# Patient Record
Sex: Male | Born: 1950
Health system: Southern US, Community
[De-identification: ages and names within clinical notes are randomized; demographics above are authoritative.]

## PROBLEM LIST (undated history)

## (undated) DIAGNOSIS — E119 Type 2 diabetes mellitus without complications: Secondary | ICD-10-CM

## (undated) DIAGNOSIS — E079 Disorder of thyroid, unspecified: Secondary | ICD-10-CM

## (undated) DIAGNOSIS — M1990 Unspecified osteoarthritis, unspecified site: Secondary | ICD-10-CM

## (undated) DIAGNOSIS — E785 Hyperlipidemia, unspecified: Secondary | ICD-10-CM

## (undated) HISTORY — DX: Disorder of thyroid, unspecified: E07.9

## (undated) HISTORY — DX: Hyperlipidemia, unspecified: E78.5

## (undated) HISTORY — DX: Unspecified osteoarthritis, unspecified site: M19.90

## (undated) HISTORY — DX: Type 2 diabetes mellitus without complications: E11.9

## (undated) HISTORY — PX: APPENDECTOMY: SHX54

## (undated) HISTORY — PX: OTHER SURGICAL HISTORY: SHX169

---

## 2004-05-14 ENCOUNTER — Emergency Department: Payer: Self-pay | Admitting: Emergency Medicine

## 2008-10-22 ENCOUNTER — Inpatient Hospital Stay: Payer: Self-pay | Admitting: Specialist

## 2012-05-04 ENCOUNTER — Emergency Department: Payer: Self-pay | Admitting: Emergency Medicine

## 2012-05-04 LAB — CBC
MCH: 32.1 pg (ref 26.0–34.0)
MCHC: 33.7 g/dL (ref 32.0–36.0)
MCV: 95 fL (ref 80–100)
Platelet: 155 10*3/uL (ref 150–440)
RDW: 13.9 % (ref 11.5–14.5)

## 2012-05-04 LAB — COMPREHENSIVE METABOLIC PANEL
BUN: 9 mg/dL (ref 7–18)
EGFR (Non-African Amer.): >60
Osmolality: 275 (ref 275–301)
Potassium: 4.4 mmol/L (ref 3.5–5.1)
SGOT(AST): 19 U/L (ref 15–37)
SGPT (ALT): 28 U/L (ref 12–78)
Sodium: 138 mmol/L (ref 136–145)

## 2017-01-15 DIAGNOSIS — Z0001 Encounter for general adult medical examination with abnormal findings: Secondary | ICD-10-CM | POA: Diagnosis not present

## 2017-01-15 DIAGNOSIS — H6123 Impacted cerumen, bilateral: Secondary | ICD-10-CM | POA: Diagnosis not present

## 2017-01-15 DIAGNOSIS — R5383 Other fatigue: Secondary | ICD-10-CM | POA: Diagnosis not present

## 2017-01-15 DIAGNOSIS — Z13220 Encounter for screening for lipoid disorders: Secondary | ICD-10-CM | POA: Diagnosis not present

## 2017-01-15 DIAGNOSIS — R634 Abnormal weight loss: Secondary | ICD-10-CM | POA: Diagnosis not present

## 2017-01-15 DIAGNOSIS — Z2821 Immunization not carried out because of patient refusal: Secondary | ICD-10-CM | POA: Diagnosis not present

## 2017-01-15 DIAGNOSIS — Z125 Encounter for screening for malignant neoplasm of prostate: Secondary | ICD-10-CM | POA: Diagnosis not present

## 2017-01-15 DIAGNOSIS — Z6821 Body mass index (BMI) 21.0-21.9, adult: Secondary | ICD-10-CM | POA: Diagnosis not present

## 2017-01-15 DIAGNOSIS — Z79899 Other long term (current) drug therapy: Secondary | ICD-10-CM | POA: Diagnosis not present

## 2017-01-15 DIAGNOSIS — Z131 Encounter for screening for diabetes mellitus: Secondary | ICD-10-CM | POA: Diagnosis not present

## 2017-04-24 DIAGNOSIS — R945 Abnormal results of liver function studies: Secondary | ICD-10-CM | POA: Diagnosis not present

## 2017-04-24 DIAGNOSIS — E0590 Thyrotoxicosis, unspecified without thyrotoxic crisis or storm: Secondary | ICD-10-CM | POA: Diagnosis not present

## 2017-04-25 DIAGNOSIS — R7989 Other specified abnormal findings of blood chemistry: Secondary | ICD-10-CM | POA: Insufficient documentation

## 2017-04-25 DIAGNOSIS — R945 Abnormal results of liver function studies: Secondary | ICD-10-CM | POA: Insufficient documentation

## 2017-06-28 DIAGNOSIS — E0590 Thyrotoxicosis, unspecified without thyrotoxic crisis or storm: Secondary | ICD-10-CM | POA: Diagnosis not present

## 2017-06-28 DIAGNOSIS — R945 Abnormal results of liver function studies: Secondary | ICD-10-CM | POA: Diagnosis not present

## 2017-06-28 DIAGNOSIS — E049 Nontoxic goiter, unspecified: Secondary | ICD-10-CM | POA: Diagnosis not present

## 2017-07-13 DIAGNOSIS — E049 Nontoxic goiter, unspecified: Secondary | ICD-10-CM | POA: Diagnosis not present

## 2017-10-23 DIAGNOSIS — E0590 Thyrotoxicosis, unspecified without thyrotoxic crisis or storm: Secondary | ICD-10-CM | POA: Diagnosis not present

## 2018-06-13 ENCOUNTER — Inpatient Hospital Stay
Admission: EM | Admit: 2018-06-13 | Discharge: 2018-06-19 | DRG: 637 | Disposition: A | Discharge status: Skilled Nursing Facility | Payer: Medicare Other | Attending: Internal Medicine | Admitting: Internal Medicine

## 2018-06-13 ENCOUNTER — Inpatient Hospital Stay: Payer: Medicare Other

## 2018-06-13 ENCOUNTER — Emergency Department: Payer: Medicare Other

## 2018-06-13 ENCOUNTER — Encounter: Payer: Self-pay | Admitting: Internal Medicine

## 2018-06-13 ENCOUNTER — Other Ambulatory Visit: Payer: Self-pay

## 2018-06-13 DIAGNOSIS — R4020 Unspecified coma: Secondary | ICD-10-CM | POA: Diagnosis not present

## 2018-06-13 DIAGNOSIS — I509 Heart failure, unspecified: Secondary | ICD-10-CM | POA: Diagnosis present

## 2018-06-13 DIAGNOSIS — T383X6A Underdosing of insulin and oral hypoglycemic [antidiabetic] drugs, initial encounter: Secondary | ICD-10-CM | POA: Diagnosis present

## 2018-06-13 DIAGNOSIS — E871 Hypo-osmolality and hyponatremia: Secondary | ICD-10-CM | POA: Diagnosis present

## 2018-06-13 DIAGNOSIS — J99 Respiratory disorders in diseases classified elsewhere: Secondary | ICD-10-CM | POA: Diagnosis not present

## 2018-06-13 DIAGNOSIS — E11649 Type 2 diabetes mellitus with hypoglycemia without coma: Secondary | ICD-10-CM | POA: Diagnosis present

## 2018-06-13 DIAGNOSIS — M6281 Muscle weakness (generalized): Secondary | ICD-10-CM | POA: Diagnosis not present

## 2018-06-13 DIAGNOSIS — Z79899 Other long term (current) drug therapy: Secondary | ICD-10-CM

## 2018-06-13 DIAGNOSIS — R571 Hypovolemic shock: Secondary | ICD-10-CM | POA: Diagnosis present

## 2018-06-13 DIAGNOSIS — G9341 Metabolic encephalopathy: Secondary | ICD-10-CM | POA: Diagnosis present

## 2018-06-13 DIAGNOSIS — N179 Acute kidney failure, unspecified: Secondary | ICD-10-CM | POA: Diagnosis not present

## 2018-06-13 DIAGNOSIS — D696 Thrombocytopenia, unspecified: Secondary | ICD-10-CM | POA: Diagnosis present

## 2018-06-13 DIAGNOSIS — R40243 Glasgow coma scale score 3-8, unspecified time: Secondary | ICD-10-CM

## 2018-06-13 DIAGNOSIS — E1311 Other specified diabetes mellitus with ketoacidosis with coma: Secondary | ICD-10-CM | POA: Diagnosis not present

## 2018-06-13 DIAGNOSIS — Z6820 Body mass index (BMI) 20.0-20.9, adult: Secondary | ICD-10-CM | POA: Diagnosis not present

## 2018-06-13 DIAGNOSIS — K219 Gastro-esophageal reflux disease without esophagitis: Secondary | ICD-10-CM | POA: Diagnosis present

## 2018-06-13 DIAGNOSIS — E860 Dehydration: Secondary | ICD-10-CM

## 2018-06-13 DIAGNOSIS — G9340 Encephalopathy, unspecified: Secondary | ICD-10-CM | POA: Diagnosis not present

## 2018-06-13 DIAGNOSIS — J90 Pleural effusion, not elsewhere classified: Secondary | ICD-10-CM | POA: Diagnosis not present

## 2018-06-13 DIAGNOSIS — E039 Hypothyroidism, unspecified: Secondary | ICD-10-CM | POA: Diagnosis not present

## 2018-06-13 DIAGNOSIS — I248 Other forms of acute ischemic heart disease: Secondary | ICD-10-CM | POA: Diagnosis present

## 2018-06-13 DIAGNOSIS — I5022 Chronic systolic (congestive) heart failure: Secondary | ICD-10-CM | POA: Diagnosis not present

## 2018-06-13 DIAGNOSIS — K859 Acute pancreatitis without necrosis or infection, unspecified: Secondary | ICD-10-CM | POA: Diagnosis present

## 2018-06-13 DIAGNOSIS — J9600 Acute respiratory failure, unspecified whether with hypoxia or hypercapnia: Secondary | ICD-10-CM | POA: Diagnosis present

## 2018-06-13 DIAGNOSIS — F17200 Nicotine dependence, unspecified, uncomplicated: Secondary | ICD-10-CM | POA: Diagnosis present

## 2018-06-13 DIAGNOSIS — N170 Acute kidney failure with tubular necrosis: Secondary | ICD-10-CM | POA: Diagnosis present

## 2018-06-13 DIAGNOSIS — M2550 Pain in unspecified joint: Secondary | ICD-10-CM | POA: Diagnosis not present

## 2018-06-13 DIAGNOSIS — K760 Fatty (change of) liver, not elsewhere classified: Secondary | ICD-10-CM | POA: Diagnosis not present

## 2018-06-13 DIAGNOSIS — L89152 Pressure ulcer of sacral region, stage 2: Secondary | ICD-10-CM | POA: Diagnosis present

## 2018-06-13 DIAGNOSIS — Y92009 Unspecified place in unspecified non-institutional (private) residence as the place of occurrence of the external cause: Secondary | ICD-10-CM

## 2018-06-13 DIAGNOSIS — J9809 Other diseases of bronchus, not elsewhere classified: Secondary | ICD-10-CM | POA: Diagnosis not present

## 2018-06-13 DIAGNOSIS — Z9114 Patient's other noncompliance with medication regimen: Secondary | ICD-10-CM | POA: Diagnosis not present

## 2018-06-13 DIAGNOSIS — E1010 Type 1 diabetes mellitus with ketoacidosis without coma: Secondary | ICD-10-CM | POA: Diagnosis not present

## 2018-06-13 DIAGNOSIS — Z452 Encounter for adjustment and management of vascular access device: Secondary | ICD-10-CM

## 2018-06-13 DIAGNOSIS — E1111 Type 2 diabetes mellitus with ketoacidosis with coma: Secondary | ICD-10-CM | POA: Diagnosis present

## 2018-06-13 DIAGNOSIS — I214 Non-ST elevation (NSTEMI) myocardial infarction: Secondary | ICD-10-CM | POA: Diagnosis not present

## 2018-06-13 DIAGNOSIS — J690 Pneumonitis due to inhalation of food and vomit: Secondary | ICD-10-CM | POA: Diagnosis present

## 2018-06-13 DIAGNOSIS — M6282 Rhabdomyolysis: Secondary | ICD-10-CM | POA: Diagnosis present

## 2018-06-13 DIAGNOSIS — R739 Hyperglycemia, unspecified: Secondary | ICD-10-CM

## 2018-06-13 DIAGNOSIS — R7989 Other specified abnormal findings of blood chemistry: Secondary | ICD-10-CM | POA: Diagnosis not present

## 2018-06-13 DIAGNOSIS — E876 Hypokalemia: Secondary | ICD-10-CM | POA: Diagnosis not present

## 2018-06-13 DIAGNOSIS — Z4659 Encounter for fitting and adjustment of other gastrointestinal appliance and device: Secondary | ICD-10-CM

## 2018-06-13 DIAGNOSIS — R748 Abnormal levels of other serum enzymes: Secondary | ICD-10-CM

## 2018-06-13 DIAGNOSIS — R404 Transient alteration of awareness: Secondary | ICD-10-CM | POA: Diagnosis not present

## 2018-06-13 DIAGNOSIS — E43 Unspecified severe protein-calorie malnutrition: Secondary | ICD-10-CM | POA: Diagnosis present

## 2018-06-13 DIAGNOSIS — R0603 Acute respiratory distress: Secondary | ICD-10-CM

## 2018-06-13 DIAGNOSIS — J9601 Acute respiratory failure with hypoxia: Secondary | ICD-10-CM | POA: Diagnosis present

## 2018-06-13 DIAGNOSIS — R000 Tachycardia, unspecified: Secondary | ICD-10-CM | POA: Diagnosis not present

## 2018-06-13 DIAGNOSIS — J9602 Acute respiratory failure with hypercapnia: Secondary | ICD-10-CM | POA: Diagnosis present

## 2018-06-13 DIAGNOSIS — E870 Hyperosmolality and hypernatremia: Secondary | ICD-10-CM | POA: Diagnosis present

## 2018-06-13 DIAGNOSIS — R0902 Hypoxemia: Secondary | ICD-10-CM | POA: Diagnosis not present

## 2018-06-13 DIAGNOSIS — Z7401 Bed confinement status: Secondary | ICD-10-CM | POA: Diagnosis not present

## 2018-06-13 DIAGNOSIS — R0689 Other abnormalities of breathing: Secondary | ICD-10-CM | POA: Diagnosis not present

## 2018-06-13 DIAGNOSIS — L8990 Pressure ulcer of unspecified site, unspecified stage: Secondary | ICD-10-CM

## 2018-06-13 DIAGNOSIS — E0590 Thyrotoxicosis, unspecified without thyrotoxic crisis or storm: Secondary | ICD-10-CM | POA: Diagnosis present

## 2018-06-13 DIAGNOSIS — E1165 Type 2 diabetes mellitus with hyperglycemia: Secondary | ICD-10-CM | POA: Diagnosis not present

## 2018-06-13 DIAGNOSIS — I10 Essential (primary) hypertension: Secondary | ICD-10-CM | POA: Diagnosis not present

## 2018-06-13 DIAGNOSIS — Z888 Allergy status to other drugs, medicaments and biological substances status: Secondary | ICD-10-CM

## 2018-06-13 DIAGNOSIS — E872 Acidosis: Secondary | ICD-10-CM | POA: Diagnosis not present

## 2018-06-13 DIAGNOSIS — Z4682 Encounter for fitting and adjustment of non-vascular catheter: Secondary | ICD-10-CM | POA: Diagnosis not present

## 2018-06-13 DIAGNOSIS — R509 Fever, unspecified: Secondary | ICD-10-CM

## 2018-06-13 LAB — BLOOD GAS, ARTERIAL
Acid-base deficit: 17.8 mmol/L — ABNORMAL HIGH (ref 0.0–2.0)
Bicarbonate: 9.9 mmol/L — ABNORMAL LOW (ref 20.0–28.0)
FIO2: 40
MECHVT: 500 mL
Mechanical Rate: 15
O2 SAT: 99.3 %
PCO2 ART: 29 mmHg — AB (ref 32.0–48.0)
PEEP: 5 cmH2O
Patient temperature: 37
pH, Arterial: 7.14 — CL (ref 7.350–7.450)
pO2, Arterial: 183 mmHg — ABNORMAL HIGH (ref 83.0–108.0)

## 2018-06-13 LAB — COMPREHENSIVE METABOLIC PANEL
ALT: 30 U/L (ref 0–44)
AST: 20 U/L (ref 15–41)
Albumin: 4.4 g/dL (ref 3.5–5.0)
Alkaline Phosphatase: 93 U/L (ref 38–126)
Anion gap: 40 — ABNORMAL HIGH (ref 5–15)
BUN: 38 mg/dL — ABNORMAL HIGH (ref 8–23)
CHLORIDE: 102 mmol/L (ref 98–111)
CO2: 7 mmol/L — ABNORMAL LOW (ref 22–32)
CREATININE: 2.81 mg/dL — AB (ref 0.61–1.24)
Calcium: 10.1 mg/dL (ref 8.9–10.3)
GFR calc Af Amer: 26 mL/min — ABNORMAL LOW (ref >60)
GFR calc non Af Amer: 22 mL/min — ABNORMAL LOW (ref >60)
Glucose, Bld: 1150 mg/dL (ref 70–99)
Potassium: 3.9 mmol/L (ref 3.5–5.1)
Sodium: 149 mmol/L — ABNORMAL HIGH (ref 135–145)
Total Bilirubin: 2.1 mg/dL — ABNORMAL HIGH (ref 0.3–1.2)
Total Protein: 7.9 g/dL (ref 6.5–8.1)

## 2018-06-13 LAB — URINALYSIS, COMPLETE (UACMP) WITH MICROSCOPIC
Bacteria, UA: NONE SEEN
Bilirubin Urine: NEGATIVE
Glucose, UA: >=500 mg/dL — AB
Ketones, ur: 20 mg/dL — AB
LEUKOCYTE UA: NEGATIVE
Nitrite: NEGATIVE
Protein, ur: NEGATIVE mg/dL
Specific Gravity, Urine: 1.028 (ref 1.005–1.030)
Squamous Epithelial / HPF: NONE SEEN (ref 0–5)
pH: 5 (ref 5.0–8.0)

## 2018-06-13 LAB — URINE DRUG SCREEN, QUALITATIVE (ARMC ONLY)
Amphetamines, Ur Screen: NOT DETECTED
Barbiturates, Ur Screen: NOT DETECTED
Benzodiazepine, Ur Scrn: NOT DETECTED
COCAINE METABOLITE, UR ~~LOC~~: NOT DETECTED
Cannabinoid 50 Ng, Ur ~~LOC~~: NOT DETECTED
MDMA (Ecstasy)Ur Screen: NOT DETECTED
Methadone Scn, Ur: NOT DETECTED
Opiate, Ur Screen: NOT DETECTED
Phencyclidine (PCP) Ur S: NOT DETECTED
Tricyclic, Ur Screen: NOT DETECTED

## 2018-06-13 LAB — LACTIC ACID, PLASMA
Lactic Acid, Venous: 5.7 mmol/L (ref 0.5–1.9)
Lactic Acid, Venous: 2.3 mmol/L (ref 0.5–1.9)
Lactic Acid, Venous: 2.2 mmol/L (ref 0.5–1.9)

## 2018-06-13 LAB — CBC WITH DIFFERENTIAL/PLATELET
Abs Immature Granulocytes: 0.97 10*3/uL — ABNORMAL HIGH (ref 0.00–0.07)
Basophils Absolute: 0.1 10*3/uL (ref 0.0–0.1)
Basophils Relative: 0 %
Eosinophils Absolute: 0 10*3/uL (ref 0.0–0.5)
Eosinophils Relative: 0 %
HCT: 52.5 % — ABNORMAL HIGH (ref 39.0–52.0)
Hemoglobin: 15.9 g/dL (ref 13.0–17.0)
Immature Granulocytes: 4 %
Lymphocytes Relative: 4 %
Lymphs Abs: 1.1 10*3/uL (ref 0.7–4.0)
MCH: 31.5 pg (ref 26.0–34.0)
MCHC: 30.3 g/dL (ref 30.0–36.0)
MCV: 104.2 fL — ABNORMAL HIGH (ref 80.0–100.0)
MONO ABS: 2.4 10*3/uL — AB (ref 0.1–1.0)
Monocytes Relative: 9 %
Neutro Abs: 22.9 10*3/uL — ABNORMAL HIGH (ref 1.7–7.7)
Neutrophils Relative %: 83 %
Platelets: 272 10*3/uL (ref 150–400)
RBC: 5.04 MIL/uL (ref 4.22–5.81)
RDW: 13.6 % (ref 11.5–15.5)
Smear Review: NORMAL
WBC: 27.5 10*3/uL — ABNORMAL HIGH (ref 4.0–10.5)
nRBC: 0 % (ref 0.0–0.2)

## 2018-06-13 LAB — BASIC METABOLIC PANEL
Anion gap: 19 — ABNORMAL HIGH (ref 5–15)
BUN: 41 mg/dL — ABNORMAL HIGH (ref 8–23)
CO2: 18 mmol/L — ABNORMAL LOW (ref 22–32)
Calcium: 8.2 mg/dL — ABNORMAL LOW (ref 8.9–10.3)
Chloride: 121 mmol/L — ABNORMAL HIGH (ref 98–111)
Creatinine, Ser: 2.49 mg/dL — ABNORMAL HIGH (ref 0.61–1.24)
GFR calc Af Amer: 30 mL/min — ABNORMAL LOW (ref >60)
GFR calc non Af Amer: 26 mL/min — ABNORMAL LOW (ref >60)
Glucose, Bld: 746 mg/dL (ref 70–99)
Potassium: 3.5 mmol/L (ref 3.5–5.1)
Sodium: 158 mmol/L — ABNORMAL HIGH (ref 135–145)

## 2018-06-13 LAB — BLOOD GAS, VENOUS
Acid-base deficit: 23.9 mmol/L — ABNORMAL HIGH (ref 0.0–2.0)
Bicarbonate: 7.5 mmol/L — ABNORMAL LOW (ref 20.0–28.0)
O2 SAT: 71.7 %
PCO2 VEN: 34 mmHg — AB (ref 44.0–60.0)
Patient temperature: 37
pH, Ven: 6.95 — CL (ref 7.250–7.430)
pO2, Ven: 62 mmHg — ABNORMAL HIGH (ref 32.0–45.0)

## 2018-06-13 LAB — GLUCOSE, CAPILLARY
Glucose-Capillary: >600 mg/dL (ref 70–99)
Glucose-Capillary: >600 mg/dL (ref 70–99)
Glucose-Capillary: >600 mg/dL (ref 70–99)
Glucose-Capillary: >600 mg/dL (ref 70–99)
Glucose-Capillary: 519 mg/dL (ref 70–99)

## 2018-06-13 LAB — INFLUENZA PANEL BY PCR (TYPE A & B)
Influenza A By PCR: NEGATIVE
Influenza B By PCR: NEGATIVE

## 2018-06-13 LAB — AMYLASE: Amylase: 1300 U/L — ABNORMAL HIGH (ref 28–100)

## 2018-06-13 LAB — PROCALCITONIN: Procalcitonin: 3.37 ng/mL

## 2018-06-13 LAB — SALICYLATE LEVEL

## 2018-06-13 LAB — ETHANOL: Alcohol, Ethyl (B): <10 mg/dL (ref <10)

## 2018-06-13 LAB — PROTIME-INR
INR: 1.2 (ref 0.8–1.2)
Prothrombin Time: 15.2 seconds (ref 11.4–15.2)

## 2018-06-13 LAB — MRSA PCR SCREENING: MRSA by PCR: NEGATIVE

## 2018-06-13 LAB — TRIGLYCERIDES: Triglycerides: 728 mg/dL — ABNORMAL HIGH (ref <150)

## 2018-06-13 LAB — ACETAMINOPHEN LEVEL: Acetaminophen (Tylenol), Serum: <10 ug/mL — ABNORMAL LOW (ref 10–30)

## 2018-06-13 LAB — LIPASE, BLOOD: Lipase: 326 U/L — ABNORMAL HIGH (ref 11–51)

## 2018-06-13 LAB — AMMONIA: AMMONIA: 22 umol/L (ref 9–35)

## 2018-06-13 LAB — TROPONIN I
Troponin I: 0.06 ng/mL (ref <0.03)
Troponin I: 0.22 ng/mL (ref <0.03)

## 2018-06-13 LAB — CK: Total CK: 555 U/L — ABNORMAL HIGH (ref 49–397)

## 2018-06-13 MED ORDER — FENTANYL CITRATE (PF) 100 MCG/2ML IJ SOLN
100.0000 ug | Freq: Once | INTRAMUSCULAR | Status: AC
  Administered 2018-06-13: 100 ug via INTRAVENOUS
  Filled 2018-06-13: qty 2

## 2018-06-13 MED ORDER — INSULIN REGULAR(HUMAN) IN NACL 100-0.9 UT/100ML-% IV SOLN
INTRAVENOUS | Status: DC
  Administered 2018-06-14: 16.3 [IU]/h via INTRAVENOUS
  Filled 2018-06-13: qty 100

## 2018-06-13 MED ORDER — STERILE WATER FOR INJECTION IV SOLN
INTRAVENOUS | Status: DC
  Administered 2018-06-13: 22:00:00 via INTRAVENOUS
  Filled 2018-06-13 (×2): qty 850

## 2018-06-13 MED ORDER — LACTATED RINGERS IV SOLN
INTRAVENOUS | Status: DC
  Administered 2018-06-14: via INTRAVENOUS

## 2018-06-13 MED ORDER — IPRATROPIUM-ALBUTEROL 0.5-2.5 (3) MG/3ML IN SOLN: 3.0000 mL | RESPIRATORY_TRACT | Status: DC | PRN

## 2018-06-13 MED ORDER — FAMOTIDINE IN NACL 20-0.9 MG/50ML-% IV SOLN
20.0000 mg | Freq: Two times a day (BID) | INTRAVENOUS | Status: DC
  Administered 2018-06-14 (×3): 20 mg via INTRAVENOUS
  Filled 2018-06-13 (×3): qty 50

## 2018-06-13 MED ORDER — METRONIDAZOLE IN NACL 5-0.79 MG/ML-% IV SOLN
500.0000 mg | Freq: Three times a day (TID) | INTRAVENOUS | Status: DC
  Administered 2018-06-13: 500 mg via INTRAVENOUS
  Filled 2018-06-13 (×3): qty 100

## 2018-06-13 MED ORDER — PIPERACILLIN-TAZOBACTAM 3.375 G IVPB
3.3750 g | Freq: Three times a day (TID) | INTRAVENOUS | Status: DC
  Administered 2018-06-14 – 2018-06-15 (×5): 3.375 g via INTRAVENOUS
  Filled 2018-06-13 (×5): qty 50

## 2018-06-13 MED ORDER — KETAMINE HCL 10 MG/ML IJ SOLN
2.0000 mg/kg | Freq: Once | INTRAMUSCULAR | Status: AC
  Administered 2018-06-13: 121 mg via INTRAVENOUS

## 2018-06-13 MED ORDER — LORAZEPAM 2 MG/ML IJ SOLN
2.0000 mg | Freq: Once | INTRAMUSCULAR | Status: AC
  Administered 2018-06-13: 2 mg via INTRAVENOUS
  Filled 2018-06-13: qty 1

## 2018-06-13 MED ORDER — VANCOMYCIN HCL 500 MG IV SOLR
500.0000 mg | Freq: Once | INTRAVENOUS | Status: AC
  Administered 2018-06-13: 500 mg via INTRAVENOUS
  Filled 2018-06-13 (×2): qty 500

## 2018-06-13 MED ORDER — SODIUM CHLORIDE 0.9 % IV SOLN
2.0000 mg/h | INTRAVENOUS | Status: DC
  Administered 2018-06-13: 2 mg/h via INTRAVENOUS
  Filled 2018-06-13: qty 10

## 2018-06-13 MED ORDER — POTASSIUM CHLORIDE 10 MEQ/100ML IV SOLN: 10.0000 meq | INTRAVENOUS | Status: DC

## 2018-06-13 MED ORDER — SODIUM CHLORIDE 0.9 % IV SOLN: INTRAVENOUS | Status: AC

## 2018-06-13 MED ORDER — SODIUM CHLORIDE 0.9 % IV BOLUS
1000.0000 mL | Freq: Once | INTRAVENOUS | Status: AC
  Administered 2018-06-13: 1000 mL via INTRAVENOUS

## 2018-06-13 MED ORDER — SODIUM BICARBONATE 8.4 % IV SOLN
100.0000 meq | Freq: Once | INTRAVENOUS | Status: AC
  Administered 2018-06-13: 100 meq via INTRAVENOUS
  Filled 2018-06-13: qty 100

## 2018-06-13 MED ORDER — MIDAZOLAM HCL 2 MG/2ML IJ SOLN
1.0000 mg | INTRAMUSCULAR | Status: DC | PRN
  Filled 2018-06-13 (×2): qty 2

## 2018-06-13 MED ORDER — FENTANYL CITRATE (PF) 100 MCG/2ML IJ SOLN: 50.0000 ug | INTRAMUSCULAR | Status: DC | PRN

## 2018-06-13 MED ORDER — SODIUM CHLORIDE 0.9 % IV BOLUS
2000.0000 mL | Freq: Once | INTRAVENOUS | Status: AC
  Administered 2018-06-13: 2000 mL via INTRAVENOUS

## 2018-06-13 MED ORDER — FENTANYL CITRATE (PF) 100 MCG/2ML IJ SOLN
50.0000 ug | INTRAMUSCULAR | Status: DC | PRN
  Administered 2018-06-14: 50 ug via INTRAVENOUS

## 2018-06-13 MED ORDER — SODIUM CHLORIDE 0.9 % IV SOLN
2.0000 g | Freq: Once | INTRAVENOUS | Status: AC
  Administered 2018-06-13: 2 g via INTRAVENOUS
  Filled 2018-06-13: qty 2

## 2018-06-13 MED ORDER — MIDAZOLAM 50MG/50ML (1MG/ML) PREMIX INFUSION
2.0000 mg/h | INTRAVENOUS | Status: DC
  Filled 2018-06-13: qty 50

## 2018-06-13 MED ORDER — SODIUM CHLORIDE 0.9 % IV SOLN
INTRAVENOUS | Status: DC
  Administered 2018-06-13: 20:00:00 via INTRAVENOUS

## 2018-06-13 MED ORDER — PANTOPRAZOLE SODIUM 40 MG IV SOLR
40.0000 mg | INTRAVENOUS | Status: DC
  Administered 2018-06-14 (×2): 40 mg via INTRAVENOUS
  Filled 2018-06-13 (×2): qty 40

## 2018-06-13 MED ORDER — HEPARIN SODIUM (PORCINE) 5000 UNIT/ML IJ SOLN
5000.0000 [IU] | Freq: Three times a day (TID) | INTRAMUSCULAR | Status: DC
  Administered 2018-06-13: 5000 [IU] via SUBCUTANEOUS
  Filled 2018-06-13: qty 1

## 2018-06-13 MED ORDER — SODIUM CHLORIDE 0.9 % IV SOLN
2.0000 g | INTRAVENOUS | Status: DC
  Filled 2018-06-13: qty 2

## 2018-06-13 MED ORDER — FENTANYL 2500MCG IN NS 250ML (10MCG/ML) PREMIX INFUSION
0.0000 ug/h | INTRAVENOUS | Status: DC
  Administered 2018-06-13: 50 ug/h via INTRAVENOUS
  Administered 2018-06-15 (×2): 75 ug/h via INTRAVENOUS
  Filled 2018-06-13 (×2): qty 250

## 2018-06-13 MED ORDER — VANCOMYCIN HCL IN DEXTROSE 1-5 GM/200ML-% IV SOLN
1000.0000 mg | Freq: Once | INTRAVENOUS | Status: AC
  Administered 2018-06-13: 1000 mg via INTRAVENOUS
  Filled 2018-06-13: qty 200

## 2018-06-13 MED ORDER — LACTATED RINGERS IV BOLUS
1000.0000 mL | Freq: Once | INTRAVENOUS | Status: AC
  Administered 2018-06-13: 1000 mL via INTRAVENOUS

## 2018-06-13 MED ORDER — MIDAZOLAM HCL 2 MG/2ML IJ SOLN
1.0000 mg | INTRAMUSCULAR | Status: DC | PRN
  Administered 2018-06-14 – 2018-06-15 (×2): 1 mg via INTRAVENOUS
  Filled 2018-06-13: qty 2

## 2018-06-13 MED ORDER — DEXTROSE-NACL 5-0.45 % IV SOLN
INTRAVENOUS | Status: DC
  Administered 2018-06-14: 04:00:00 via INTRAVENOUS

## 2018-06-13 MED ORDER — ROCURONIUM BROMIDE 50 MG/5ML IV SOLN
60.0000 mg | Freq: Once | INTRAVENOUS | Status: AC
  Administered 2018-06-13: 60 mg via INTRAVENOUS
  Filled 2018-06-13: qty 6

## 2018-06-13 MED ORDER — VANCOMYCIN VARIABLE DOSE PER UNSTABLE RENAL FUNCTION (PHARMACIST DOSING): Status: DC

## 2018-06-13 MED ORDER — POTASSIUM CHLORIDE 10 MEQ/100ML IV SOLN
10.0000 meq | INTRAVENOUS | Status: AC
  Administered 2018-06-13 (×2): 10 meq via INTRAVENOUS
  Filled 2018-06-13 (×2): qty 100

## 2018-06-13 MED ORDER — INSULIN REGULAR(HUMAN) IN NACL 100-0.9 UT/100ML-% IV SOLN
INTRAVENOUS | Status: DC
  Administered 2018-06-13: 5.4 [IU]/h via INTRAVENOUS
  Filled 2018-06-13: qty 100

## 2018-06-13 NOTE — Consult Note (Addendum)
Pharmacy Antibiotic Note  Joseph Luna is a 68 y.o. male admitted on 06/13/2018 with respiratory distress, hyperglycemia  and Sepsis.  Pharmacy has been consulted for Cefepime and Vancomycin dosing. Patient received Vancomycin 1g IV and Cefepime 2g IV x 1 dose in ED.   Plan: Will order an additional Vancomycin 500mg  x 1 for a total LD of 1500mg . Patient has acute renal failure. Scr is 2.81. Will order vancomycin based on random levels. 1st level 2/28 @ 1800. Pharmacy will continue to follow renal function and order vancomycin doses as needed   Start Cefepime 2g IV every 24 hours.    Height: 6' (182.9 cm) Weight: 133 lb 6.1 oz (60.5 kg) IBW/kg (Calculated) : 77.6  No data recorded.  Recent Labs  Lab 06/13/18 1455 06/13/18 1844  WBC 27.5*  --   CREATININE 2.81*  --   LATICACIDVEN 5.7* 2.3*    Estimated Creatinine Clearance: 21.8 mL/min (A) (by C-G formula based on SCr of 2.81 mg/dL (H)).    No Known Allergies  Antimicrobials this admission: 2/27 vancomycin >>  2/27 Cefepime >>   Dose adjustments this admission:   Microbiology results: 2/7 UCx: pending  Thank you for allowing pharmacy to be a part of this patient's care.  Gardner Candle, PharmD, BCPS Clinical Pharmacist 06/13/2018 7:38 PM

## 2018-06-13 NOTE — Consult Note (Addendum)
PULMONARY / CRITICAL CARE MEDICINE   Name: Joseph Luna MRN: [id number] DOB: Sep 03, 1950    ADMISSION DATE:  06/13/2018 CONSULTATION DATE:  06/13/2018  REFERRING MD: Dr. Posey Pronto  CHIEF COMPLAINT: Unresponsive, DKA  BRIEF DISCUSSION: 68 year old male with past medical history of diabetes, was found unresponsive at home, required intubation in the ED for airway protection and impending respiratory failure. Patient with severe DKA and diabetic coma, severe metabolic acidosis, and AKI.  Patient meets SIRS criteria, concern for possible pancreatitis and possible aspiration pneumonia.  HISTORY OF PRESENT ILLNESS:   Joseph Luna is a 68 year old male with a past medical history of diabetes and hypothyroidism who presents to [facility] ED on 06/13/2018 after being found unresponsive in the floor at his home today by his family.  Last known well time was yesterday afternoon.  Upon arrival to the ED he was noted to be comatose and hypoxic, therefore he was intubated for airway protection and impending respiratory failure.  Initial work-up in the ED revealed glucose 1150, serum bicarb 7, anion gap 40, creatinine 2.81, sodium 149, WBCs 27.5, lipase 326 , troponin 0.06, CK 555, lactic acid 5.7.  Venous blood gas with metabolic acidosis.  Urinalysis is positive for ketones and negative for UTI.  Urine drug screen is negative, CT head is negative, chest x-ray is negative.  He is negative for the flu, serum Tylenol less than 10, and salicylates less than 7.  He received aggressive IV fluid resuscitation, empiric antibiotics, and was placed on insulin drip. He is being admitted to [facility] ICU for severe DKA and diabetic coma, acute hypoxic respiratory failure requiring intubation, severe metabolic acidosis, and AKI in the setting of severe dehydration.  He meets SIRS criteria, no infectious source has yet been identified, but there is concern for possible Pancreatitis and possible Aspiration Pneumonia.  PCCM was consulted for further  management.  PAST MEDICAL HISTORY :  He  has a past medical history of Hypothyroid.  PAST SURGICAL HISTORY: He  has a past surgical history that includes none.  No Known Allergies  No current facility-administered medications on file prior to encounter.    Current Outpatient Medications on File Prior to Encounter  Medication Sig  . methimazole (TAPAZOLE) 10 MG tablet Take 20 mg by mouth daily.    FAMILY HISTORY:  His has no family status information on file.    SOCIAL HISTORY: He  reports that he has been smoking. He has never used smokeless tobacco.  REVIEW OF SYSTEMS:   Unable to obtain due to intubation and sedation  SUBJECTIVE:  Unable to obtain due to intubation and sedation  VITAL SIGNS: BP (!) 110/54 (BP Location: Left Arm)   Pulse (!) 116   Temp 98.4 F (36.9 C) (Oral)   Resp 17   Ht 6' (1.829 m)   Wt 67.6 kg   SpO2 100%   BMI 20.21 kg/m   HEMODYNAMICS:    VENTILATOR SETTINGS: Vent Mode: PRVC FiO2 (%):  [40 %] 40 % Set Rate:  [14 bmp] 14 bmp Vt Set:  [500 mL] 500 mL PEEP:  [5 cmH20] 5 cmH20 Plateau Pressure:  [12 cmH20] 12 cmH20  INTAKE / OUTPUT: I/O last 3 completed shifts: In: 2000 [IV Piggyback:2000] Out: -   PHYSICAL EXAMINATION: General: Acutely ill-appearing male, laying in bed, intubated, sedated, no acute distress Neuro: Sedated, withdraws from pain, pupils PERRLA 3 mm sluggish bilaterally HEENT: Atraumatic, normocephalic, neck supple, no JVD Cardiovascular: Tachycardia, regular rhythm, S1-S2, no murmurs rubs or gallops, 1+ pulses  throughout Lungs: Clear to auscultation bilaterally, tachypnea, even, nonlabored, vent assisted Abdomen: Soft, nontender, nondistended, no guarding or rebound tenderness, bowel sounds hypoactive Musculoskeletal: Normal bulk and tone, no deformities, no edema Skin: Cool and dry, no obvious rashes, lesions, or ulcerations  LABS:  BMET Recent Labs  Lab 06/13/18 1455  NA 149*  K 3.9  CL 102  CO2 7*   BUN 38*  CREATININE 2.81*  GLUCOSE 1,150*    Electrolytes Recent Labs  Lab 06/13/18 1455  CALCIUM 10.1    CBC Recent Labs  Lab 06/13/18 1455  WBC 27.5*  HGB 15.9  HCT 52.5*  PLT 272    Coag's Recent Labs  Lab 06/13/18 1455  INR 1.2    Sepsis Markers Recent Labs  Lab 06/13/18 1455 06/13/18 1844  LATICACIDVEN 5.7* 2.3*    ABG No results for input(s): PHART, PCO2ART, PO2ART in the last 168 hours.  Liver Enzymes Recent Labs  Lab 06/13/18 1455  AST 20  ALT 30  ALKPHOS 93  BILITOT 2.1*  ALBUMIN 4.4    Cardiac Enzymes Recent Labs  Lab 06/13/18 1455  TROPONINI 0.06*    Glucose Recent Labs  Lab 06/13/18 1754 06/13/18 2005  GLUCAP >600* >600*    Imaging Ct Head Wo Contrast  Result Date: 06/13/2018 CLINICAL DATA:  Found unresponsive this morning. EXAM: CT HEAD WITHOUT CONTRAST TECHNIQUE: Contiguous axial images were obtained from the base of the skull through the vertex without intravenous contrast. COMPARISON:  None. FINDINGS: Brain: Mild chronic small vessel ischemic changes within the deep periventricular white matter. No mass, hemorrhage, edema or other evidence of acute parenchymal abnormality. No extra-axial hemorrhage. Vascular: Chronic calcified atherosclerotic changes of the large vessels at the skull base. No unexpected hyperdense vessel. Skull: Normal. Negative for fracture or focal lesion. Sinuses/Orbits: No acute finding. Other: None. IMPRESSION: No acute findings. No intracranial mass, hemorrhage or edema. Electronically Signed   By: Franki Cabot M.D.   On: 06/13/2018 16:08   Dg Chest Portable 1 View  Result Date: 06/13/2018 CLINICAL DATA:  Hypoxia and unresponsive EXAM: PORTABLE CHEST 1 VIEW COMPARISON:  None. FINDINGS: Endotracheal tube tip is 4.1 cm above the carina. No pneumothorax. There is no edema or consolidation. Heart size and pulmonary vascularity are normal. No adenopathy. There is aortic atherosclerosis. No bone lesions.  IMPRESSION: Endotracheal tube as described without pneumothorax. No edema or consolidation. Heart size normal. Aortic Atherosclerosis (ICD10-I70.0). Electronically Signed   By: Lowella Grip III M.D.   On: 06/13/2018 15:50     STUDIES:  CT head 2/27>> negative Abdominal ultrasound 2/27>>  CULTURES: Urine 2/27>> Tracheal aspirate 2/27>> Blood x2 2/27>>  ANTIBIOTICS: Cefepime 2/27>> Vancomycin 2/27>>  SIGNIFICANT EVENTS: 06/13/2018>> admission to [facility] ICU  LINES/TUBES: ETT 2/27>>   ASSESSMENT / PLAN:  PULMONARY A: Acute Hypoxic Respiratory Failure in setting of Diabetic Coma and severe Metabolic Acidosis, ? Aspiration in setting of unresponsiveness  P:   Full Vent support, PRVC: 8 cc/kg Wean FiO2 & PEEP as tolerated Follow intermittent chest x-ray and ABG VAP bundle SBT when parameters met and mental status permits Follow sputum culture Continue cefepime and vancomycin for now  CARDIOVASCULAR A:  Tachycardia in setting of severe dehydration Mildly elevated troponin, likely demand ischemia P:  Cardiac monitoring Maintain map greater than 65 Aggressive IV fluids Levophed if needed to maintain map goal Trend troponin  RENAL A:   AKI in setting of severe dehydration Hypernatremia Severe Anion gap metabolic acidosis in setting of DKA and lactic acidosis -ABG:  pH 7.14/CO2 29/bicarb 9.9 Rhabdomyolysis P:   Monitor I&O's / urinary output Follow BMP Ensure adequate renal perfusion Avoid nephrotoxic agents as able Replace electrolytes as indicated Aggressive IV fluids Will give 2 Amps of bicarb and place on bicarb infusion Trend lactic acid Trend CK  GASTROINTESTINAL A:   Elevated lipase, concern for possible pancreatitis P:   N.p.o. IV fluids OG tube Obtain amylase and Triglycerides Obtain abdominal ultrasound Pepcid for SUP  HEMATOLOGIC A:   No acute issues P:  Monitor for S/Sx of bleeding Trend CBC SQ Heparin for VTE Prophylaxis   Transfuse for Hgb <7   INFECTIOUS A:   Meets SIRS criteria Leukocytosis No obvious source of infection, but concern for possible pancreatitis and possible aspiration pneumonia -Urinalysis negative for UTI -Chest x-ray without infiltrate P:   Monitor fever curve Trend WBCs and procalcitonin Follow pan cultures Continue vancomycin and cefepime Serum amylase and abdominal ultrasound pending>> if pancreatitis is identified, will change cefepime to Zosyn  ENDOCRINE A:   DKA  Hypothyroidism P:   Follow DKA protocol Aggressive IV fluids IV insulin drip Follow BMP every 4 hours Consult diabetes coordinator, appreciate input Continue home Synthroid   NEUROLOGIC A:   Acute metabolic encephalopathy in setting of diabetic coma and DKA -CT head negative for any acute intracranial abnormalities -Urine drug screen negative P:   RASS goal: 0 to -1 Fentanyl drip to maintain RASS goal Avoid sedating meds as able Provide supportive care Treat metabolic issues   FAMILY  - Updates: No family present at bedside for update 06/13/2018. Pt is a Full Code.  - Inter-disciplinary family meet or Palliative Care meeting due by:  06/20/2018    Darel Hong, AGACNP-BC [facility] Pager: [phone] Cell: [phone]  06/13/2018, 8:39 PM

## 2018-06-13 NOTE — ED Notes (Signed)
ED TO INPATIENT HANDOFF REPORT  Name/Age/Gender Joseph Luna 68 y.o. male  Code Status   Home/SNF/Other home  Chief Complaint unresponsive ems  Level of Care/Admitting Diagnosis ED Disposition    ED Disposition Condition Comment   Admit  Hospital Area: [facility] [[id number]]  Level of Care: ICU [6]  Diagnosis: Acute respiratory failure (HCC) [518.81.ICD-9-CM]  Admitting Physician: [place] Bilberry [[id number]]  Attending Physician: [place] Bilberry [[id number]]  Estimated length of stay: past midnight tomorrow  Certification:: I certify this patient will need inpatient services for at least 2 midnights  PT Class (Do Not Modify): Inpatient [101]  PT Acc Code (Do Not Modify): Private [1]       Medical History Past Medical History:  Diagnosis Date  . Hypothyroid     Allergies Allergies not on file  IV Location/Drains/Wounds Patient Lines/Drains/Airways Status   Active Line/Drains/Airways    Name:   Placement date:   Placement time:   Site:   Days:   Peripheral IV 06/13/18 Right Hand   06/13/18    1432    Hand   less than 1   Peripheral IV 06/13/18 Left Antecubital   06/13/18    1558    Antecubital   less than 1   Peripheral IV 06/13/18 Right Antecubital   06/13/18    1757    Antecubital   less than 1   Airway 8 mm   06/13/18    1500     less than 1          Labs/Imaging Results for orders placed or performed during the hospital encounter of 06/13/18 (from the past 48 hour(s))  Comprehensive metabolic panel     Status: Abnormal   Collection Time: 06/13/18  2:55 PM  Result Value Ref Range   Sodium 149 (H) 135 - 145 mmol/L   Potassium 3.9 3.5 - 5.1 mmol/L   Chloride 102 98 - 111 mmol/L   CO2 7 (L) 22 - 32 mmol/L   Glucose, Bld 1,150 (HH) 70 - 99 mg/dL    Comment: CRITICAL RESULT CALLED TO, READ BACK BY AND VERIFIED WITH Fumie Fiallo @ 1602 ON 06/13/18 BY JUW    BUN 38 (H) 8 - 23 mg/dL   Creatinine, Ser 8.11 (H) 0.61 - 1.24 mg/dL   Calcium 91.4  8.9 - 78.2 mg/dL   Total Protein 7.9 6.5 - 8.1 g/dL   Albumin 4.4 3.5 - 5.0 g/dL   AST 20 15 - 41 U/L   ALT 30 0 - 44 U/L   Alkaline Phosphatase 93 38 - 126 U/L   Total Bilirubin 2.1 (H) 0.3 - 1.2 mg/dL   GFR calc non Af Amer 22 (L) >60 mL/min   GFR calc Af Amer 26 (L) >60 mL/min   Anion gap 40 (H) 5 - 15    Comment: Performed at [facility], [address] [place], [place] [address]  Acetaminophen level     Status: Abnormal   Collection Time: 06/13/18  2:55 PM  Result Value Ref Range   Acetaminophen (Tylenol), Serum <10 (L) 10 - 30 ug/mL    Comment: (NOTE) Therapeutic concentrations vary significantly. A range of 10-30 ug/mL  may be an effective concentration for many patients. However, some  are best treated at concentrations outside of this range. Acetaminophen concentrations >150 ug/mL at 4 hours after ingestion  and >50 ug/mL at 12 hours after ingestion are often associated with  toxic reactions. Performed at [facility], [address] [place], [place] [address]   Salicylate level     Status: None   Collection Time: 06/13/18  2:55 PM  Result Value Ref Range   Salicylate Lvl <7.0 2.8 - 30.0 mg/dL    Comment: Performed at [facility], [address] [place], [place] [address]  CBC with Differential     Status: Abnormal   Collection Time: 06/13/18  2:55 PM  Result Value Ref Range   WBC 27.5 (H) 4.0 - 10.5 K/uL   RBC 5.04 4.22 - 5.81 MIL/uL   Hemoglobin 15.9 13.0 - 17.0 g/dL   HCT 82.952.5 (H) 56.239.0 - 13.052.0 %   MCV 104.2 (H) 80.0 - 100.0 fL   MCH 31.5 26.0 - 34.0 pg   MCHC 30.3 30.0 - 36.0 g/dL   RDW 86.513.6 78.411.5 - 69.615.5 %   Platelets 272 150 - 400 K/uL   nRBC 0.0 0.0 - 0.2 %   Neutrophils Relative % 83 %   Neutro Abs 22.9 (H) 1.7 - 7.7 K/uL   Lymphocytes Relative 4 %   Lymphs Abs 1.1 0.7 - 4.0 K/uL   Monocytes Relative 9 %   Monocytes Absolute 2.4 (H) 0.1 - 1.0 K/uL   Eosinophils Relative 0 %   Eosinophils Absolute 0.0 0.0 - 0.5 K/uL    Basophils Relative 0 %   Basophils Absolute 0.1 0.0 - 0.1 K/uL   WBC Morphology TOXIC GRANULATION    RBC Morphology MORPHOLOGY UNREMARKABLE    Smear Review Normal platelet morphology    Immature Granulocytes 4 %   Abs Immature Granulocytes 0.97 (H) 0.00 - 0.07 K/uL    Comment: Performed at [facility], [address] [place], [place] [address]  Protime-INR     Status: None   Collection Time: 06/13/18  2:55 PM  Result Value Ref Range   Prothrombin Time 15.2 11.4 - 15.2 seconds   INR 1.2 0.8 - 1.2    Comment: (NOTE) INR goal varies based on device and disease states. Performed at [facility], [address] [place], [place] [address]   Lactic acid, plasma     Status: Abnormal   Collection Time: 06/13/18  2:55 PM  Result Value Ref Range   Lactic Acid, Venous 5.7 (HH) 0.5 - 1.9 mmol/L    Comment: CRITICAL RESULT CALLED TO, READ BACK BY AND VERIFIED WITH Addysen Louth @ 1602 ON 06/13/18 BY JUW Performed at [facility], [address] [place], [place] [address]   Ammonia     Status: None   Collection Time: 06/13/18  2:55 PM  Result Value Ref Range   Ammonia 22 9 - 35 umol/L    Comment: Performed at [facility], [address] [place], [place] [address]  Ethanol     Status: None   Collection Time: 06/13/18  2:55 PM  Result Value Ref Range   Alcohol, Ethyl (B) <10 <10 mg/dL    Comment: (NOTE) Lowest detectable limit for serum alcohol is 10 mg/dL. For medical purposes only. Performed at [facility], [address] [place], [place] [address]   Lipase, blood     Status: Abnormal   Collection Time: 06/13/18  2:55 PM  Result Value Ref Range   Lipase 326 (H) 11 - 51 U/L    Comment: RESULTS CONFIRMED BY MANUAL DILUTION Performed at [facility], [address] [place], [place] [address]   Troponin I - Once     Status: Abnormal   Collection Time: 06/13/18  2:55 PM  Result Value Ref Range   Troponin I 0.06 (HH)  <0.03 ng/mL    Comment: CRITICAL RESULT CALLED TO, READ BACK BY AND VERIFIED WITH Sherina Stammer @ 1602 ON 06/13/18 BY JUW Performed at [facility], [address] [place], [place] [address]   Urinalysis, Complete w Microscopic     Status: Abnormal   Collection Time: 06/13/18  3:06 PM  Result Value Ref Range   Color, Urine YELLOW (A) YELLOW   APPearance CLEAR (A) CLEAR   Specific Gravity, Urine 1.028 1.005 - 1.030   pH 5.0 5.0 - 8.0   Glucose, UA >=500 (A) NEGATIVE mg/dL   Hgb urine dipstick MODERATE (A) NEGATIVE   Bilirubin Urine NEGATIVE NEGATIVE   Ketones, ur 20 (A) NEGATIVE mg/dL   Protein, ur NEGATIVE NEGATIVE mg/dL   Nitrite NEGATIVE NEGATIVE   Leukocytes,Ua NEGATIVE NEGATIVE   RBC / HPF 0-5 0 - 5 RBC/hpf   WBC, UA 0-5 0 - 5 WBC/hpf   Bacteria, UA NONE SEEN NONE SEEN   Squamous Epithelial / LPF NONE SEEN 0 - 5   Mucus PRESENT    Hyaline Casts, UA PRESENT     Comment: Performed at  Hospital, [address], [place], [place] [address]  Urine Drug Screen, Qualitative     Status: None   Collection Time: 06/13/18  3:06 PM  Result Value Ref Range   Tricyclic, Ur Screen NONE DETECTED NONE DETECTED   Amphetamines, Ur Screen NONE DETECTED NONE DETECTED   MDMA (Ecstasy)Ur Screen NONE DETECTED NONE DETECTED   Cocaine Metabolite,Ur [place] NONE DETECTED NONE DETECTED   Opiate, Ur Screen NONE DETECTED NONE DETECTED   Phencyclidine (PCP) Ur S NONE DETECTED NONE DETECTED   Cannabinoid 50 Ng, Ur [place] NONE DETECTED NONE DETECTED   Barbiturates, Ur Screen NONE DETECTED NONE DETECTED   Benzodiazepine, Ur Scrn NONE DETECTED NONE DETECTED   Methadone Scn, Ur NONE DETECTED NONE DETECTED    Comment: (NOTE) Tricyclics + metabolites, urine    Cutoff 1000 ng/mL Amphetamines + metabolites, urine  Cutoff 1000 ng/mL MDMA (Ecstasy), urine              Cutoff 500 ng/mL Cocaine Metabolite, urine          Cutoff 300 ng/mL Opiate + metabolites, urine        Cutoff 300  ng/mL Phencyclidine (PCP), urine         Cutoff 25 ng/mL Cannabinoid, urine                 Cutoff 50 ng/mL Barbiturates + metabolites, urine  Cutoff 200 ng/mL Benzodiazepine, urine              Cutoff 200 ng/mL Methadone, urine                   Cutoff 300 ng/mL The urine drug screen provides only a preliminary, unconfirmed analytical test result and should not be used for non-medical purposes. Clinical consideration and professional judgment should be applied to any positive drug screen result due to possible interfering substances. A more specific alternate chemical method must be used in order to obtain a confirmed analytical result. Gas chromatography / mass spectrometry (GC/MS) is the preferred confirmat ory method. Performed at [facility], [address] [place], [place] [address]   CK     Status: Abnormal   Collection Time: 06/13/18  3:41 PM  Result Value Ref Range   Total CK 555 (H) 49 -  397 U/L    Comment: Performed at [facility], [address] [place], [place] [address]  Blood gas, venous     Status: Abnormal   Collection Time: 06/13/18  4:59 PM  Result Value Ref Range   pH, Ven 6.95 (LL) 7.250 - 7.430    Comment: CRITICAL RESULT CALLED TO, READ BACK BY AND VERIFIED WITH: DR MAYOKHTX [id number] ABL    pCO2, Ven 34 (L) 44.0 - 60.0 mmHg   pO2, Ven 62.0 (H) 32.0 - 45.0 mmHg   Bicarbonate 7.5 (L) 20.0 - 28.0 mmol/L   Acid-base deficit 23.9 (H) 0.0 - 2.0 mmol/L   O2 Saturation 71.7 %   Patient temperature 37.0    Collection site VEIN    Sample type VENOUS     Comment: Performed at [facility], [address] [place], [place] [address]  Glucose, capillary     Status: Abnormal   Collection Time: 06/13/18  5:54 PM  Result Value Ref Range   Glucose-Capillary >600 (HH) 70 - 99 mg/dL   Ct Head Wo Contrast  Result Date: 06/13/2018 CLINICAL DATA:  Found unresponsive this morning. EXAM: CT HEAD WITHOUT CONTRAST TECHNIQUE: Contiguous  axial images were obtained from the base of the skull through the vertex without intravenous contrast. COMPARISON:  None. FINDINGS: Brain: Mild chronic small vessel ischemic changes within the deep periventricular white matter. No mass, hemorrhage, edema or other evidence of acute parenchymal abnormality. No extra-axial hemorrhage. Vascular: Chronic calcified atherosclerotic changes of the large vessels at the skull base. No unexpected hyperdense vessel. Skull: Normal. Negative for fracture or focal lesion. Sinuses/Orbits: No acute finding. Other: None. IMPRESSION: No acute findings. No intracranial mass, hemorrhage or edema. Electronically Signed   By: Bary Richard M.D.   On: 06/13/2018 16:08   Dg Chest Portable 1 View  Result Date: 06/13/2018 CLINICAL DATA:  Hypoxia and unresponsive EXAM: PORTABLE CHEST 1 VIEW COMPARISON:  None. FINDINGS: Endotracheal tube tip is 4.1 cm above the carina. No pneumothorax. There is no edema or consolidation. Heart size and pulmonary vascularity are normal. No adenopathy. There is aortic atherosclerosis. No bone lesions. IMPRESSION: Endotracheal tube as described without pneumothorax. No edema or consolidation. Heart size normal. Aortic Atherosclerosis (ICD10-I70.0). Electronically Signed   By: Bretta Bang III M.D.   On: 06/13/2018 15:50    Pending Labs Unresulted Labs (From admission, onward)    Start     Ordered   06/13/18 1804  Hemoglobin A1c  Once,   STAT     06/13/18 1803   06/13/18 1804  Thyroid Panel With TSH  Once,   STAT     06/13/18 1803   06/13/18 1530  Influenza panel by PCR (type A & B)  (Influenza PCR Panel)  ONCE - STAT,   STAT     06/13/18 1529   06/13/18 1506  Urine culture  Once,   STAT     06/13/18 1506   06/13/18 1506  Lactic acid, plasma  Now then every 2 hours,   STAT     06/13/18 1506   Signed and Held  HIV antibody (Routine Testing)  Once,   R     Signed and Held   Signed and Held  Basic metabolic panel  STAT Now then every 4 hours  ,   STAT     Signed and Held   Signed and Held  CBC  (heparin)  Once,   R    Comments:  Baseline for heparin therapy IF  NOT ALREADY DRAWN.  Notify MD if PLT < 100 K.    Signed and Held   Signed and Held  Creatinine, serum  (heparin)  Once,   R    Comments:  Baseline for heparin therapy IF NOT ALREADY DRAWN.    Signed and Held          Vitals/Pain Today's Vitals   06/13/18 1600 06/13/18 1630 06/13/18 1700 06/13/18 1730  BP: (!) 112/55 (!) 118/54 (!) 113/57 (!) 114/51  Pulse: (!) 120 (!) 118 (!) 116 (!) 114  Resp: (!) 21 (!) 23 (!) 23 (!) 23  SpO2: 100% 100% 100% 100%  Weight:      Height:      PainSc:        Isolation Precautions Droplet precaution  Medications Medications  fentaNYL in NS (38mcg/ml) infusion-PREMIX (0 mcg/hr Intravenous Hold 06/13/18 1609)  midazolam (VERSED) 50 mg in sodium chloride 0.9 % 50 mL (1 mg/mL) infusion (2 mg/hr Intravenous New Bag/Given 06/13/18 1604)  metroNIDAZOLE (FLAGYL) IVPB 500 mg (0 mg Intravenous Stopped 06/13/18 1735)  vancomycin (VANCOCIN) IVPB 1000 mg/200 mL premix (1,000 mg Intravenous New Bag/Given 06/13/18 1732)  insulin regular, human (MYXREDLIN) 100 units/ 100 mL infusion (5.4 Units/hr Intravenous New Bag/Given 06/13/18 1803)  potassium chloride 10 mEq in 100 mL IVPB (has no administration in time range)  vancomycin (VANCOCIN) 500 mg in sodium chloride 0.9 % 100 mL IVPB (has no administration in time range)  rocuronium (ZEMURON) injection 60 mg (60 mg Intravenous Given 06/13/18 1458)  ketamine (KETALAR) injection 121 mg (121 mg Intravenous Given 06/13/18 1521)  sodium chloride 0.9 % bolus 2,000 mL (2,000 mLs Intravenous New Bag/Given 06/13/18 1522)  LORazepam (ATIVAN) injection 2 mg (2 mg Intravenous Given 06/13/18 1527)  fentaNYL (SUBLIMAZE) injection 100 mcg (100 mcg Intravenous Given 06/13/18 1528)  ceFEPIme (MAXIPIME) 2 g in sodium chloride 0.9 % 100 mL IVPB (0 g Intravenous Stopped 06/13/18 1714)  sodium chloride 0.9 %  bolus 1,000 mL (1,000 mLs Intravenous New Bag/Given 06/13/18 1714)    Mobility uta

## 2018-06-13 NOTE — ED Notes (Signed)
Pt intubated- 27 at the tube

## 2018-06-13 NOTE — Progress Notes (Signed)
Pharmacy Antibiotic Note  Joseph Luna is a 68 y.o. male admitted on 06/13/2018 with sepsis.  Pharmacy has been consulted for zosyn dosing.  Plan: Zosyn 3.375g IV q8h (4 hour infusion).  Height: 6' (182.9 cm) Weight: 149 lb 0.5 oz (67.6 kg) IBW/kg (Calculated) : 77.6  Temp (24hrs), Avg:98.4 F (36.9 C), Min:98.4 F (36.9 C), Max:98.4 F (36.9 C)  Recent Labs  Lab 06/13/18 1455 06/13/18 1844 06/13/18 2032  WBC 27.5*  --   --   CREATININE 2.81*  --   --   LATICACIDVEN 5.7* 2.3* 2.2*    Estimated Creatinine Clearance: 24.4 mL/min (A) (by C-G formula based on SCr of 2.81 mg/dL (H)).    No Known Allergies   Thank you for allowing pharmacy to be a part of this patient's care.  Thomasene Ripple, PharmD, BCPS Clinical Pharmacist 06/13/2018

## 2018-06-13 NOTE — ED Notes (Signed)
Positive color change

## 2018-06-13 NOTE — ED Triage Notes (Signed)
Pt to ed from ems from home. Pt was found in home this morning unresponsive. Last seen yesterday afernoon. PT was found in puddle of urine. CBG for ems 368. Skin mottle on lets . Only response is to pain.

## 2018-06-13 NOTE — ED Provider Notes (Signed)
[facility] Emergency Department Provider Note  ____________________________________________  Time seen: Approximately 3:23 PM  I have reviewed the triage vital signs and the nursing notes.   HISTORY  Chief Complaint Respiratory Distress    Level 5 Caveat: Portions of the History and Physical including HPI and review of systems are unable to be completely obtained due to patient being comatose on arrival  HPI Dyshon Engert is a 68 y.o. male with a past medical history of diabetes who was found unresponsive on the floor in his home today.  Last known well was yesterday afternoon when he visited with family.  No known symptoms at that time.  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      Past medical history significant for diabetes   There are no active problems to display for this patient.    Past surgical history noncontributory   Prior to Admission medications   Not on File  Unknown medication regimen   Allergies Patient has no allergy information on record. No known allergies  No family history on file.  Social History Social History   Tobacco Use  . Smoking status: Not on file  Substance Use Topics  . Alcohol use: Not on file  . Drug use: Not on file  Unknown smoking alcohol and drug use history  Review of Systems Level 5 Caveat: Portions of the History and Physical including HPI and review of systems are unable to be completely obtained due to patient being comatose on arrival ____________________________________________   PHYSICAL EXAM:  VITAL SIGNS: ED Triage Vitals  Enc Vitals Group     BP --      Pulse --      Resp --      Temp --      Temp src --      SpO2 06/13/18 1452 100 %     Weight 06/13/18 1458 133 lb 6.1 oz (60.5 kg)     Height 06/13/18 1458 6' (1.829 m)     Head Circumference --      Peak Flow --      Pain Score 06/13/18  1456 Asleep     Pain Loc --      Pain Edu? --      Excl. in GC? --     Vital signs reviewed, nursing assessments reviewed. Patient unable to participate in exam due to coma  Constitutional:   Comatose, unresponsive.  Ill-appearing, respiratory distress Eyes:   Conjunctivae are normal. EOMI.  ENT      Head:   Normocephalic and atraumatic.      Nose:   No congestion/rhinnorhea.       Mouth/Throat:   Dry mucous membranes, no pharyngeal erythema. No peritonsillar mass.       Neck:   No meningismus. Full ROM. Hematological/Lymphatic/Immunilogical:   No cervical lymphadenopathy. Cardiovascular:   Tachycardia heart rate 130. Symmetric bilateral radial and DP pulses.  No murmurs. Cap refill less than 2 seconds. Respiratory:   Tachypnea and deep respirations consistent with coo small breathing.  Clear breath sounds diffusely, no wheezes or crackles Gastrointestinal:   Soft and nontender. Non distended. There is no CVA tenderness.  No rebound, rigidity, or guarding. Genitourinary:   Normal Musculoskeletal:   Normal range of motion in all extremities. No joint effusions.  No lower extremity tenderness.  No edema. Neurologic:   GCS E1 V1 M3 Corneal reflex intact, no other responsiveness.  Skin:    Skin is warm, dry and intact.  Cool peripherally.  Mottling of the skin of lower extremities.   No petechiae, purpura, or bullae.  ____________________________________________    LABS (pertinent positives/negatives) (all labs ordered are listed, but only abnormal results are displayed) Labs Reviewed  CBC WITH DIFFERENTIAL/PLATELET - Abnormal; Notable for the following components:      Result Value   WBC 27.5 (*)    HCT 52.5 (*)    MCV 104.2 (*)    All other components within normal limits  URINE CULTURE  COMPREHENSIVE METABOLIC PANEL  ACETAMINOPHEN LEVEL  SALICYLATE LEVEL  URINALYSIS, COMPLETE (UACMP) WITH MICROSCOPIC  URINE DRUG SCREEN, QUALITATIVE ([facility] ONLY)  BLOOD GAS, VENOUS   PROTIME-INR  LACTIC ACID, PLASMA  LACTIC ACID, PLASMA  AMMONIA  ETHANOL  LIPASE, BLOOD  TROPONIN I   ____________________________________________   EKG    ____________________________________________    RADIOLOGY  No results found.  ____________________________________________   PROCEDURES .Critical Care Performed by: Sharman CheekStafford, Emaley Applin, MD Authorized by: Sharman CheekStafford, Aveion Nguyen, MD   Critical care provider statement:    Critical care time (minutes):  35   Critical care time was exclusive of:  Separately billable procedures and treating other patients   Critical care was necessary to treat or prevent imminent or life-threatening deterioration of the following conditions:  Respiratory failure and CNS failure or compromise   Critical care was time spent personally by me on the following activities:  Development of treatment plan with patient or surrogate, discussions with consultants, evaluation of patient's response to treatment, examination of patient, obtaining history from patient or surrogate, ordering and performing treatments and interventions, ordering and review of laboratory studies, ordering and review of radiographic studies, pulse oximetry, re-evaluation of patient's condition and review of old charts Comments:        Procedure Name: Intubation Date/Time: 06/13/2018 3:28 PM Performed by: Sharman CheekStafford, Carolle Ishii, MD Pre-anesthesia Checklist: Patient identified, Patient being monitored, Emergency Drugs available, Timeout performed and Suction available Oxygen Delivery Method: Non-rebreather mask Preoxygenation: Pre-oxygenation with 100% oxygen Induction Type: Rapid sequence Ventilation: Mask ventilation without difficulty Laryngoscope Size: Glidescope and 3 Grade View: Grade I Tube type: Subglottic suction tube Tube size: 8.0 mm Number of attempts: 1 Placement Confirmation: ETT inserted through vocal cords under direct vision,  CO2 detector and Breath sounds checked-  equal and bilateral Tube secured with: ETT holder Dental Injury: Teeth and Oropharynx as per pre-operative assessment        ____________________________________________  DIFFERENTIAL DIAGNOSIS   DKA, dehydration, intracranial hemorrhage, pneumonia, influenza  CLINICAL IMPRESSION / ASSESSMENT AND PLAN / ED COURSE  Pertinent labs & imaging results that were available during my care of the patient were reviewed by me and considered in my medical decision making (see chart for details).    Patient presents in extremis with respiratory distress, high risk for decompensation and acute respiratory failure.  With coma and inability to maintain his airway, patient was intubated on arrival for airway protection and impending respiratory failure.  High suspicion is for DKA and severe dehydration.  Will give IV fluids 2 L bolus while awaiting labs CT head and chest x-ray.  CBC shows a leukocytosis of 27,000 so empiric antibiotics with cefepime Flagyl and vancomycin have been added.  Although presentation is not consistent with sepsis given the available information initially, sepsis work-up is being pursued due to the acute critical illness.  Patient will need ICU admission for that management and further care of his acute critical illness..[name]  ____________________________________________   FINAL CLINICAL IMPRESSION(S) / ED DIAGNOSES    Final diagnoses:  Glasgow coma scale total score 3-8, unspecified coma timing (HCC)  Respiratory distress  Hyperglycemia  Dehydration     ED Discharge Orders    None      Portions of this note were generated with dragon dictation software. Dictation errors may occur despite best attempts at proofreading.   Sharman Cheek, MD 06/13/18 [id number]

## 2018-06-13 NOTE — ED Provider Notes (Signed)
-----------------------------------------   5:45 PM on 06/13/2018 -----------------------------------------  Patient care assumed from Dr. Scotty Court.  Patient's labs have been coming back showing an elevated CK level of 500, VBG pH of 6.95 consistent with severe acidosis.  Lactate of only 5.7, suspect new onset diabetes/DKA with coma.  Chemistry is finally resulted showing a very significantly elevated blood glucose of 1100.  Potassium is 3.9.  We will begin insulin infusion along with IV potassium repletion.  Patient is on his third liter of fluid.  Currently sedated with fentanyl and Versed.  Patient will require admission to the ICU.  I have discussed the patient with the hospitalist will be admitting. Sodium has resulted at 149, once corrected for glucose this would be consistent with fairly significant hypernatremia with a corrected glucose of 165.  We will continue with IV fluids.   CRITICAL CARE Performed by: Minna Antis   Total critical care time: 60 minutes  Critical care time was exclusive of separately billable procedures and treating other patients.  Critical care was necessary to treat or prevent imminent or life-threatening deterioration.  Critical care was time spent personally by me on the following activities: development of treatment plan with patient and/or surrogate as well as nursing, discussions with consultants, evaluation of patient's response to treatment, examination of patient, obtaining history from patient or surrogate, ordering and performing treatments and interventions, ordering and review of laboratory studies, ordering and review of radiographic studies, pulse oximetry and re-evaluation of patient's condition.    Minna Antis, MD 06/13/18 [phone]

## 2018-06-13 NOTE — ED Notes (Signed)
60 mg rocuronium given 120 mg ketamine given by stephanie rudd, rn

## 2018-06-13 NOTE — ED Notes (Signed)
Verbal order for 60mg  rocuronium  Verbal order for 120mg  ketamine

## 2018-06-13 NOTE — H&P (Addendum)
[facility] - [place] at [facility]   PATIENT NAME: Joseph Luna    MR#:  [id number]  DATE OF BIRTH:  11-19-1950  DATE OF ADMISSION:  06/13/2018  PRIMARY CARE PHYSICIAN: Patient, No Pcp Per   REQUESTING/REFERRING PHYSICIAN: Minna Antis, MD  CHIEF COMPLAINT:   Chief Complaint  Patient presents with  . Respiratory Distress    HISTORY OF PRESENT ILLNESS: Joseph Luna  is a 68 y.o. male with a known history of hypothyroidism with no known previous history of diabetes who was found unresponsive on the floor in his home today.  Last known well was yesterday by family.  Cording to his son when he saw him he was not acting right.  When EMS arrived to his house found him unresponsive and blood sugar was high.  In the emergency room he is noted to have severe hyperglycemia, metabolic acidosis.  To protect his airway he was intubated.  His daughter and son are at the bedside.  They report that their father has never told him that he has diabetes.    PAST MEDICAL HISTORY:   Past Medical History:  Diagnosis Date  . Hypothyroid     PAST SURGICAL HISTORY:  Past Surgical History:  Procedure Laterality Date  . none      SOCIAL HISTORY:  Social History   Tobacco Use  . Smoking status: Current Every Day Smoker  . Smokeless tobacco: Never Used  Substance Use Topics  . Alcohol use: Not on file    FAMILY HISTORY:  Family History  Family history unknown: Yes    DRUG ALLERGIES: No Known Allergies  REVIEW OF SYSTEMS:   CONSTITUTIONAL: Sedated   MEDICATIONS AT HOME:  Prior to Admission medications   Medication Sig Start Date End Date Taking? Authorizing Provider  methimazole (TAPAZOLE) 10 MG tablet Take 20 mg by mouth daily. 06/07/18  Yes [provider]      PHYSICAL EXAMINATION:   VITAL SIGNS: Blood pressure (!) 111/56, pulse (!) 115, resp. rate (!) 23, height 6' (1.829 m), weight 60.5 kg, SpO2 100 %.  GENERAL:  68 y.o.-year-old patient lying in  the bed critically ill.  EYES: Pupils equal, round, reactive to light and accommodation. No scleral icterus.  HEENT: Head atraumatic, normocephalic. Oropharynx and nasopharynx clear.  NECK:  Supple, no jugular venous distention. No thyroid enlargement, no tenderness.  LUNGS: Normal breath sounds bilaterally, no wheezing, rales,rhonchi or crepitation. No use of accessory muscles of respiration.  CARDIOVASCULAR: S1, S2 normal. No murmurs, rubs, or gallops.  ABDOMEN: Soft, nontender, nondistended. Bowel sounds present. No organomegaly or mass.  EXTREMITIES: No pedal edema, cyanosis, or clubbing.  NEUROLOGIC: Sedated PSYCHIATRIC: Sedated SKIN: No obvious rash, lesion, or ulcer.   LABORATORY PANEL:   CBC Recent Labs  Lab 06/13/18 1455  WBC 27.5*  HGB 15.9  HCT 52.5*  PLT 272  MCV 104.2*  MCH 31.5  MCHC 30.3  RDW 13.6  LYMPHSABS 1.1  MONOABS 2.4*  EOSABS 0.0  BASOSABS 0.1   ------------------------------------------------------------------------------------------------------------------  Chemistries  Recent Labs  Lab 06/13/18 1455  NA 149*  K 3.9  CL 102  CO2 7*  GLUCOSE 1,150*  BUN 38*  CREATININE 2.81*  CALCIUM 10.1  AST 20  ALT 30  ALKPHOS 93  BILITOT 2.1*   ------------------------------------------------------------------------------------------------------------------ estimated creatinine clearance is 21.8 mL/min (A) (by C-G formula based on SCr of 2.81 mg/dL (H)). ------------------------------------------------------------------------------------------------------------------ No results for input(s): TSH, T4TOTAL, T3FREE, THYROIDAB in the last 72 hours.  Invalid input(s): FREET3  Coagulation profile Recent Labs  Lab 06/13/18 1455  INR 1.2   ------------------------------------------------------------------------------------------------------------------- No results for input(s): DDIMER in the last 72  hours. -------------------------------------------------------------------------------------------------------------------  Cardiac Enzymes Recent Labs  Lab 06/13/18 1455  TROPONINI 0.06*   ------------------------------------------------------------------------------------------------------------------ Invalid input(s): POCBNP  ---------------------------------------------------------------------------------------------------------------  Urinalysis    Component Value Date/Time   COLORURINE YELLOW (A) 06/13/2018 1506   APPEARANCEUR CLEAR (A) 06/13/2018 1506   LABSPEC 1.028 06/13/2018 1506   PHURINE 5.0 06/13/2018 1506   GLUCOSEU >=500 (A) 06/13/2018 1506   HGBUR MODERATE (A) 06/13/2018 1506   BILIRUBINUR NEGATIVE 06/13/2018 1506   KETONESUR 20 (A) 06/13/2018 1506   PROTEINUR NEGATIVE 06/13/2018 1506   NITRITE NEGATIVE 06/13/2018 1506   LEUKOCYTESUR NEGATIVE 06/13/2018 1506     RADIOLOGY: Ct Head Wo Contrast  Result Date: 06/13/2018 CLINICAL DATA:  Found unresponsive this morning. EXAM: CT HEAD WITHOUT CONTRAST TECHNIQUE: Contiguous axial images were obtained from the base of the skull through the vertex without intravenous contrast. COMPARISON:  None. FINDINGS: Brain: Mild chronic small vessel ischemic changes within the deep periventricular white matter. No mass, hemorrhage, edema or other evidence of acute parenchymal abnormality. No extra-axial hemorrhage. Vascular: Chronic calcified atherosclerotic changes of the large vessels at the skull base. No unexpected hyperdense vessel. Skull: Normal. Negative for fracture or focal lesion. Sinuses/Orbits: No acute finding. Other: None. IMPRESSION: No acute findings. No intracranial mass, hemorrhage or edema. Electronically Signed   By: Bary RichardStan  Maynard M.D.   On: 06/13/2018 16:08   Dg Chest Portable 1 View  Result Date: 06/13/2018 CLINICAL DATA:  Hypoxia and unresponsive EXAM: PORTABLE CHEST 1 VIEW COMPARISON:  None. FINDINGS:  Endotracheal tube tip is 4.1 cm above the carina. No pneumothorax. There is no edema or consolidation. Heart size and pulmonary vascularity are normal. No adenopathy. There is aortic atherosclerosis. No bone lesions. IMPRESSION: Endotracheal tube as described without pneumothorax. No edema or consolidation. Heart size normal. Aortic Atherosclerosis (ICD10-I70.0). Electronically Signed   By: Bretta BangWilliam  Woodruff III M.D.   On: 06/13/2018 15:50    EKG: Orders placed or performed during the hospital encounter of 06/13/18  . ED EKG  . ED EKG    IMPRESSION AND PLAN: Patient 68 year old brought in unresponsive  1.  DKA we will give aggressive IV fluids insulin therapy Monitor blood sugar closely, monitor electrolytes closely Aggressive IV fluids Echo hemoglobin A1c emperic antibitoics for now for possible sepsis  2.  Acute encephalopathy due to severe hypoglycemia as well as hypoxia Patient's mental status will need to be assessed once stable with holding sedation  3.  Hypoxia likely related to DKA continue ventilator intensivist has been notified of the admission  4.  Acute renal failure give aggressive IV fluids monitor renal function need nephrology consult if worsened tomorrow  5.  Rhabdomyolysis give IV fluids check CPK in the morning  6.  Hypothyroidism check TSH current medication has not dose available we will need to request restarted once dose known    All the records are reviewed and case discussed with ED provider. Management plans discussed with the patient, family and they are in agreement.  CODE STATUS:    Code Status Orders  (From admission, onward)         Start     Ordered   06/13/18 1826  Full code  Continuous     06/13/18 1826        Code Status History    This patient has a current code status but no historical code status.  TOTAL TIME TAKING CARE OF THIS PATIENT: 55 minutes of critical care time spent.    Auburn Bilberry M.D on 06/13/2018 at  7:12 PM  Between 7am to 6pm - Pager - [phone]  After 6pm go to [email or web address] - password [email or web address] [facility]  [facility] Office  [phone]  CC: Primary care physician; Patient, No Pcp Per

## 2018-06-14 ENCOUNTER — Inpatient Hospital Stay: Payer: Medicare Other

## 2018-06-14 ENCOUNTER — Inpatient Hospital Stay
Admit: 2018-06-14 | Discharge: 2018-06-14 | Disposition: A | Discharge status: Home / Self Care | Payer: Medicare Other | Attending: Pulmonary Disease | Admitting: Pulmonary Disease

## 2018-06-14 DIAGNOSIS — E872 Acidosis: Secondary | ICD-10-CM

## 2018-06-14 LAB — BASIC METABOLIC PANEL
Anion gap: 6 (ref 5–15)
Anion gap: 7 (ref 5–15)
Anion gap: 6 (ref 5–15)
BUN: 35 mg/dL — ABNORMAL HIGH (ref 8–23)
BUN: 32 mg/dL — ABNORMAL HIGH (ref 8–23)
BUN: 32 mg/dL — AB (ref 8–23)
CALCIUM: 7.9 mg/dL — AB (ref 8.9–10.3)
CO2: 29 mmol/L (ref 22–32)
CO2: 27 mmol/L (ref 22–32)
CO2: 25 mmol/L (ref 22–32)
CREATININE: 1.65 mg/dL — AB (ref 0.61–1.24)
CREATININE: 1.59 mg/dL — AB (ref 0.61–1.24)
Calcium: 7.6 mg/dL — ABNORMAL LOW (ref 8.9–10.3)
Calcium: 7.3 mg/dL — ABNORMAL LOW (ref 8.9–10.3)
Chloride: 126 mmol/L — ABNORMAL HIGH (ref 98–111)
Chloride: 127 mmol/L — ABNORMAL HIGH (ref 98–111)
Chloride: 126 mmol/L — ABNORMAL HIGH (ref 98–111)
Creatinine, Ser: 1.72 mg/dL — ABNORMAL HIGH (ref 0.61–1.24)
GFR calc Af Amer: 47 mL/min — ABNORMAL LOW (ref >60)
GFR calc Af Amer: 49 mL/min — ABNORMAL LOW (ref >60)
GFR calc Af Amer: 51 mL/min — ABNORMAL LOW (ref >60)
GFR calc non Af Amer: 40 mL/min — ABNORMAL LOW (ref >60)
GFR calc non Af Amer: 42 mL/min — ABNORMAL LOW (ref >60)
GFR calc non Af Amer: 44 mL/min — ABNORMAL LOW (ref >60)
GLUCOSE: 211 mg/dL — AB (ref 70–99)
Glucose, Bld: 350 mg/dL — ABNORMAL HIGH (ref 70–99)
Glucose, Bld: 123 mg/dL — ABNORMAL HIGH (ref 70–99)
Potassium: 3.1 mmol/L — ABNORMAL LOW (ref 3.5–5.1)
Potassium: 3.8 mmol/L (ref 3.5–5.1)
Potassium: 4.5 mmol/L (ref 3.5–5.1)
Sodium: 161 mmol/L (ref 135–145)
Sodium: 161 mmol/L (ref 135–145)
Sodium: 157 mmol/L — ABNORMAL HIGH (ref 135–145)

## 2018-06-14 LAB — TROPONIN I
Troponin I: 1.14 ng/mL (ref <0.03)
Troponin I: 4.78 ng/mL (ref <0.03)

## 2018-06-14 LAB — BLOOD GAS, ARTERIAL
Acid-Base Excess: 4.3 mmol/L — ABNORMAL HIGH (ref 0.0–2.0)
Bicarbonate: 29.8 mmol/L — ABNORMAL HIGH (ref 20.0–28.0)
FIO2: 0.3
MECHVT: 500 mL
O2 Saturation: 98.4 %
PEEP: 5 cmH2O
Patient temperature: 37
RATE: 14 resp/min
pCO2 arterial: 47 mmHg (ref 32.0–48.0)
pH, Arterial: 7.41 (ref 7.350–7.450)
pO2, Arterial: 112 mmHg — ABNORMAL HIGH (ref 83.0–108.0)

## 2018-06-14 LAB — GLUCOSE, CAPILLARY
GLUCOSE-CAPILLARY: 192 mg/dL — AB (ref 70–99)
GLUCOSE-CAPILLARY: 104 mg/dL — AB (ref 70–99)
GLUCOSE-CAPILLARY: 145 mg/dL — AB (ref 70–99)
Glucose-Capillary: 115 mg/dL — ABNORMAL HIGH (ref 70–99)
Glucose-Capillary: 115 mg/dL — ABNORMAL HIGH (ref 70–99)
Glucose-Capillary: 120 mg/dL — ABNORMAL HIGH (ref 70–99)
Glucose-Capillary: 141 mg/dL — ABNORMAL HIGH (ref 70–99)
Glucose-Capillary: 141 mg/dL — ABNORMAL HIGH (ref 70–99)
Glucose-Capillary: 190 mg/dL — ABNORMAL HIGH (ref 70–99)
Glucose-Capillary: 202 mg/dL — ABNORMAL HIGH (ref 70–99)
Glucose-Capillary: 214 mg/dL — ABNORMAL HIGH (ref 70–99)
Glucose-Capillary: 218 mg/dL — ABNORMAL HIGH (ref 70–99)
Glucose-Capillary: 257 mg/dL — ABNORMAL HIGH (ref 70–99)
Glucose-Capillary: 304 mg/dL — ABNORMAL HIGH (ref 70–99)
Glucose-Capillary: 385 mg/dL — ABNORMAL HIGH (ref 70–99)
Glucose-Capillary: 399 mg/dL — ABNORMAL HIGH (ref 70–99)
Glucose-Capillary: 492 mg/dL — ABNORMAL HIGH (ref 70–99)
Glucose-Capillary: 94 mg/dL (ref 70–99)

## 2018-06-14 LAB — PHOSPHORUS
Phosphorus: 2.2 mg/dL — ABNORMAL LOW (ref 2.5–4.6)
Phosphorus: 1.9 mg/dL — ABNORMAL LOW (ref 2.5–4.6)

## 2018-06-14 LAB — APTT: aPTT: 42 seconds — ABNORMAL HIGH (ref 24–36)

## 2018-06-14 LAB — COMPREHENSIVE METABOLIC PANEL
ALT: 20 U/L (ref 0–44)
ANION GAP: 7 (ref 5–15)
AST: 40 U/L (ref 15–41)
Albumin: 2.8 g/dL — ABNORMAL LOW (ref 3.5–5.0)
Alkaline Phosphatase: 51 U/L (ref 38–126)
BUN: 34 mg/dL — ABNORMAL HIGH (ref 8–23)
CO2: 28 mmol/L (ref 22–32)
Calcium: 8 mg/dL — ABNORMAL LOW (ref 8.9–10.3)
Chloride: 126 mmol/L — ABNORMAL HIGH (ref 98–111)
Creatinine, Ser: 1.53 mg/dL — ABNORMAL HIGH (ref 0.61–1.24)
GFR calc Af Amer: 54 mL/min — ABNORMAL LOW (ref >60)
GFR calc non Af Amer: 46 mL/min — ABNORMAL LOW (ref >60)
Glucose, Bld: 163 mg/dL — ABNORMAL HIGH (ref 70–99)
POTASSIUM: 3.2 mmol/L — AB (ref 3.5–5.1)
Sodium: 161 mmol/L (ref 135–145)
Total Bilirubin: 0.6 mg/dL (ref 0.3–1.2)
Total Protein: 5.1 g/dL — ABNORMAL LOW (ref 6.5–8.1)

## 2018-06-14 LAB — TRIGLYCERIDES: Triglycerides: 213 mg/dL — ABNORMAL HIGH (ref <150)

## 2018-06-14 LAB — ECHOCARDIOGRAM COMPLETE
Height: 72 in
Weight: 2384.5 oz

## 2018-06-14 LAB — URINE CULTURE: Culture: NO GROWTH

## 2018-06-14 LAB — HEPARIN LEVEL (UNFRACTIONATED)
HEPARIN UNFRACTIONATED: 0.86 [IU]/mL — AB (ref 0.30–0.70)
Heparin Unfractionated: 0.58 IU/mL (ref 0.30–0.70)

## 2018-06-14 LAB — MAGNESIUM
Magnesium: 2.9 mg/dL — ABNORMAL HIGH (ref 1.7–2.4)
Magnesium: 2.2 mg/dL (ref 1.7–2.4)

## 2018-06-14 LAB — AMYLASE: Amylase: 743 U/L — ABNORMAL HIGH (ref 28–100)

## 2018-06-14 LAB — CK: Total CK: 1937 U/L — ABNORMAL HIGH (ref 49–397)

## 2018-06-14 LAB — LACTIC ACID, PLASMA: Lactic Acid, Venous: 2.4 mmol/L (ref 0.5–1.9)

## 2018-06-14 LAB — PROCALCITONIN: Procalcitonin: 7.55 ng/mL

## 2018-06-14 MED ORDER — SODIUM CHLORIDE 0.9% FLUSH
10.0000 mL | Freq: Two times a day (BID) | INTRAVENOUS | Status: DC
  Administered 2018-06-14 – 2018-06-16 (×5): 10 mL
  Administered 2018-06-16: 30 mL
  Administered 2018-06-17: 20 mL
  Administered 2018-06-17 – 2018-06-18 (×3): 10 mL

## 2018-06-14 MED ORDER — HEPARIN (PORCINE) 25000 UT/250ML-% IV SOLN
750.0000 [IU]/h | INTRAVENOUS | Status: DC
  Administered 2018-06-14: 1000 [IU]/h via INTRAVENOUS
  Administered 2018-06-15: 750 [IU]/h via INTRAVENOUS
  Filled 2018-06-14 (×2): qty 250

## 2018-06-14 MED ORDER — FREE WATER: 100.0000 mL | Status: DC

## 2018-06-14 MED ORDER — POTASSIUM CHLORIDE 10 MEQ/100ML IV SOLN
10.0000 meq | INTRAVENOUS | Status: AC
  Administered 2018-06-14 (×4): 10 meq via INTRAVENOUS
  Filled 2018-06-14 (×4): qty 100

## 2018-06-14 MED ORDER — INSULIN GLARGINE 100 UNIT/ML ~~LOC~~ SOLN
15.0000 [IU] | Freq: Every day | SUBCUTANEOUS | Status: DC
  Administered 2018-06-14 – 2018-06-15 (×3): 15 [IU] via SUBCUTANEOUS
  Filled 2018-06-14 (×4): qty 0.15

## 2018-06-14 MED ORDER — ONDANSETRON HCL 4 MG/2ML IJ SOLN
4.0000 mg | Freq: Four times a day (QID) | INTRAMUSCULAR | Status: DC
  Administered 2018-06-14: 4 mg via INTRAVENOUS

## 2018-06-14 MED ORDER — ONDANSETRON HCL 4 MG/2ML IJ SOLN
INTRAMUSCULAR | Status: AC
  Administered 2018-06-14: 4 mg via INTRAVENOUS
  Filled 2018-06-14: qty 2

## 2018-06-14 MED ORDER — POTASSIUM PHOSPHATES 15 MMOLE/5ML IV SOLN
20.0000 mmol | Freq: Once | INTRAVENOUS | Status: AC
  Administered 2018-06-14: 20 mmol via INTRAVENOUS
  Filled 2018-06-14: qty 6.67

## 2018-06-14 MED ORDER — PHENYLEPHRINE HCL-NACL 10-0.9 MG/250ML-% IV SOLN
0.0000 ug/min | INTRAVENOUS | Status: DC
  Administered 2018-06-14: 20 ug/min via INTRAVENOUS
  Filled 2018-06-14: qty 250

## 2018-06-14 MED ORDER — ONDANSETRON HCL 4 MG/2ML IJ SOLN: 4.0000 mg | Freq: Four times a day (QID) | INTRAMUSCULAR | Status: DC | PRN

## 2018-06-14 MED ORDER — LACTATED RINGERS IV BOLUS
1000.0000 mL | Freq: Once | INTRAVENOUS | Status: AC
  Administered 2018-06-14: 1000 mL via INTRAVENOUS

## 2018-06-14 MED ORDER — VITAL AF 1.2 CAL PO LIQD
1000.0000 mL | ORAL | Status: DC
  Administered 2018-06-14: 1000 mL

## 2018-06-14 MED ORDER — NOREPINEPHRINE 16 MG/250ML-% IV SOLN
0.0000 ug/min | INTRAVENOUS | Status: DC
  Administered 2018-06-15 (×2): 2 ug/min via INTRAVENOUS
  Filled 2018-06-14: qty 250

## 2018-06-14 MED ORDER — SODIUM CHLORIDE 0.9% FLUSH: 10.0000 mL | INTRAVENOUS | Status: DC | PRN

## 2018-06-14 MED ORDER — FREE WATER
200.0000 mL | Status: DC
  Administered 2018-06-14 – 2018-06-15 (×5): 200 mL

## 2018-06-14 MED ORDER — LACTATED RINGERS IV BOLUS
500.0000 mL | Freq: Once | INTRAVENOUS | Status: AC
  Administered 2018-06-14: 500 mL via INTRAVENOUS

## 2018-06-14 MED ORDER — ASPIRIN 300 MG RE SUPP
300.0000 mg | Freq: Once | RECTAL | Status: AC
  Administered 2018-06-14: 300 mg via RECTAL
  Filled 2018-06-14: qty 1

## 2018-06-14 MED ORDER — ORAL CARE MOUTH RINSE
15.0000 mL | OROMUCOSAL | Status: DC
  Administered 2018-06-14 – 2018-06-15 (×8): 15 mL via OROMUCOSAL

## 2018-06-14 MED ORDER — KCL IN DEXTROSE-NACL 40-5-0.45 MEQ/L-%-% IV SOLN
INTRAVENOUS | Status: DC
  Administered 2018-06-14 (×3): via INTRAVENOUS
  Filled 2018-06-14 (×5): qty 1000

## 2018-06-14 MED ORDER — CHLORHEXIDINE GLUCONATE 0.12% ORAL RINSE (MEDLINE KIT)
15.0000 mL | Freq: Two times a day (BID) | OROMUCOSAL | Status: DC
  Administered 2018-06-14 – 2018-06-15 (×3): 15 mL via OROMUCOSAL

## 2018-06-14 MED ORDER — INSULIN ASPART 100 UNIT/ML ~~LOC~~ SOLN
0.0000 [IU] | SUBCUTANEOUS | Status: DC
  Administered 2018-06-14: 3 [IU] via SUBCUTANEOUS
  Administered 2018-06-15: 5 [IU] via SUBCUTANEOUS
  Administered 2018-06-15: 7 [IU] via SUBCUTANEOUS
  Administered 2018-06-15: 3 [IU] via SUBCUTANEOUS
  Filled 2018-06-14 (×3): qty 1

## 2018-06-14 NOTE — Progress Notes (Signed)
Pt has multiple IV infusions running, frequent lab draws, with possibility of needing vasopressors.  Pt needs Central venous assess.  Called and spoke with pt's son Jayz Mau, who gives consent for central line placement.  Also updated Bren on his father's status.   Harlon Ditty, AGACNP-BC  Pulmonary & Critical Care Medicine Pager: [phone] Cell: [phone]

## 2018-06-14 NOTE — Progress Notes (Signed)
ANTICOAGULATION CONSULT NOTE - Initial Consult  Pharmacy Consult for heparin drip Indication: chest pain/ACS  No Known Allergies  Patient Measurements: Height: 6' (182.9 cm) Weight: 149 lb 0.5 oz (67.6 kg) IBW/kg (Calculated) : 77.6 Heparin Dosing Weight: 68 kg  Vital Signs: Temp: 97.2 F (36.2 C) (02/28 0900) Temp Source: Bladder (02/28 0400) BP: 93/54 (02/28 0900) Pulse Rate: 77 (02/28 0900)  Labs: Recent Labs    06/13/18 1455 06/13/18 1541 06/13/18 2032 06/13/18 2211 06/14/18 0233 06/14/18 0557 06/14/18 0705 06/14/18 0855 06/14/18 1037  HGB 15.9  --   --   --   --   --   --   --   --   HCT 52.5*  --   --   --   --   --   --   --   --   PLT 272  --   --   --   --   --   --   --   --   APTT  --   --   --   --   --  42*  --   --   --   LABPROT 15.2  --   --   --   --   --   --   --   --   INR 1.2  --   --   --   --   --   --   --   --   HEPARINUNFRC  --   --   --   --   --   --   --   --  0.58  CREATININE 2.81*  --   --  2.49* 1.72*  --  1.53*  --   --   CKTOTAL  --  555*  --   --   --   --  1,937*  --   --   TROPONINI 0.06*  --  0.22*  --  1.14*  --   --  4.78*  --     Estimated Creatinine Clearance: 44.8 mL/min (A) (by C-G formula based on SCr of 1.53 mg/dL (H)).   Medical History: Past Medical History:  Diagnosis Date  . Hypothyroid     Medications:  Scheduled:  . chlorhexidine gluconate (MEDLINE KIT)  15 mL Mouth Rinse BID  . mouth rinse  15 mL Mouth Rinse 10 times per day  . pantoprazole (PROTONIX) IV  40 mg Intravenous Q24H  . sodium chloride flush  10-40 mL Intracatheter Q12H    Assessment: Patient admitted s/t respiratory dx s/p intubation, and now patient's trops rising 0.06 >> 0.22 >> 1.14, EKG pending. No PTA anticoagulation, being started on a heparin drip for ACS.  2/28 10:37 HL 0.58  Goal of Therapy:  Heparin level 0.3-0.7 units/ml Monitor platelets by anticoagulation protocol: Yes   Plan:  Will continue with current rate of 1000  units/hr. Check next Heparin level in 6 hours for confirmation. Daily CBC while on Heparin drip.  Paulina Fusi, PharmD, BCPS 06/14/2018 11:31 AM

## 2018-06-14 NOTE — Consult Note (Signed)
[facility] Cardiology  CARDIOLOGY CONSULT NOTE  Patient ID: Joseph Luna MRN: [id number] DOB/AGE: 11-10-1950 68 y.o.  Admit date: 06/13/2018 Referring Physician Allena Katz Primary Physician None per patient Primary Cardiologist None per patient Reason for Consultation Elevated troponin  HPI: 68 year old male referred for evaluation of elevated troponin.  The patient has a history of hypothyroidism.  The patient was brought to [facility] ER yesterday after being found unresponsive at home, noted to have elevated blood glucose.  Per the ER note, the patient's family reported that the patient was well the day before.  Labs notable for glucose 1,150, creatinine 2.81, BUN 38, GFR 22, lactic acid 5.7, amylase 1,300, sodium 165, CK 500.  Initial troponin was 0.06, followed by 0.22, 1.14, and 4.78. The patient was treated for rhabdomyolysis and DKA.  The patient is currently intubated.  ECG revealed normal sinus rhythm at a rate of 90 bpm with inferior Q waves with ST-T wave abnormalities in the lateral leads.  The patient was started on heparin drip.  Review of systems complete and found to be negative unless listed above     Past Medical History:  Diagnosis Date  . Hypothyroid     Past Surgical History:  Procedure Laterality Date  . none      Medications Prior to Admission  Medication Sig Dispense Refill Last Dose  . methimazole (TAPAZOLE) 10 MG tablet Take 20 mg by mouth daily.      Social History   Socioeconomic History  . Marital status: Single    Spouse name: Not on file  . Number of children: Not on file  . Years of education: Not on file  . Highest education level: Not on file  Occupational History  . Not on file  Social Needs  . Financial resource strain: Not on file  . Food insecurity:    Worry: Not on file    Inability: Not on file  . Transportation needs:    Medical: Not on file    Non-medical: Not on file  Tobacco Use  . Smoking status: Current Every Day Smoker  . Smokeless tobacco:  Never Used  Substance and Sexual Activity  . Alcohol use: Not on file  . Drug use: Not on file  . Sexual activity: Not on file  Lifestyle  . Physical activity:    Days per week: Not on file    Minutes per session: Not on file  . Stress: Not on file  Relationships  . Social connections:    Talks on phone: Not on file    Gets together: Not on file    Attends religious service: Not on file    Active member of club or organization: Not on file    Attends meetings of clubs or organizations: Not on file    Relationship status: Not on file  . Intimate partner violence:    Fear of current or ex partner: Not on file    Emotionally abused: Not on file    Physically abused: Not on file    Forced sexual activity: Not on file  Other Topics Concern  . Not on file  Social History Narrative  . Not on file    Family History  Family history unknown: Yes      Review of systems unable to be completed as the patient is intubated and sedated.     PHYSICAL EXAM  General: Ill-appearing male lying in bed, currently intubated and sedated. HEENT:  Normocephalic and atramatic Neck:  No JVD.  Lungs:  Clear bilaterally to auscultation, on ventilator Heart: HRRR . Normal S1 and S2 without gallops or murmurs.  Abdomen: nondistended Msk:  No obvious deformity  Extremities: No clubbing, cyanosis or edema.   Neuro: sedated  Labs:   Lab Results  Component Value Date   WBC 27.5 (H) 06/13/2018   HGB 15.9 06/13/2018   HCT 52.5 (H) 06/13/2018   MCV 104.2 (H) 06/13/2018   PLT 272 06/13/2018    Recent Labs  Lab 06/14/18 0705  NA 161*  K 3.2*  CL 126*  CO2 28  BUN 34*  CREATININE 1.53*  CALCIUM 8.0*  PROT 5.1*  BILITOT 0.6  ALKPHOS 51  ALT 20  AST 40  GLUCOSE 163*   Lab Results  Component Value Date   CKTOTAL 1,937 (H) 06/14/2018   TROPONINI 4.78 (HH) 06/14/2018   No results found for: CHOL No results found for: HDL No results found for: [facility] Lab Results  Component Value  Date   TRIG 213 (H) 06/14/2018   TRIG 728 (H) 06/13/2018   No results found for: CHOLHDL No results found for: LDLDIRECT    Radiology: Dg Abd 1 View  Result Date: 06/14/2018 CLINICAL DATA:  Orogastric tube placement. EXAM: ABDOMEN - 1 VIEW COMPARISON:  None. FINDINGS: Nasogastric tube tip projects in gastric cardia, side-port at GE junction region. Single loop of gas distended colon. Lung bases are clear. Included soft tissue planes and osseous structures are non suspicious. IMPRESSION: Nasogastric tube tip projects in gastric cardia. Electronically Signed   By: Awilda Metro M.D.   On: 06/14/2018 06:20   Ct Head Wo Contrast  Result Date: 06/13/2018 CLINICAL DATA:  Found unresponsive this morning. EXAM: CT HEAD WITHOUT CONTRAST TECHNIQUE: Contiguous axial images were obtained from the base of the skull through the vertex without intravenous contrast. COMPARISON:  None. FINDINGS: Brain: Mild chronic small vessel ischemic changes within the deep periventricular white matter. No mass, hemorrhage, edema or other evidence of acute parenchymal abnormality. No extra-axial hemorrhage. Vascular: Chronic calcified atherosclerotic changes of the large vessels at the skull base. No unexpected hyperdense vessel. Skull: Normal. Negative for fracture or focal lesion. Sinuses/Orbits: No acute finding. Other: None. IMPRESSION: No acute findings. No intracranial mass, hemorrhage or edema. Electronically Signed   By: Bary Richard M.D.   On: 06/13/2018 16:08   US Abdomen Complete  Result Date: 06/13/2018 CLINICAL DATA:  Elevated lipase EXAM: ABDOMEN ULTRASOUND COMPLETE COMPARISON:  None. FINDINGS: Gallbladder: No gallstones or wall thickening visualized. No sonographic Murphy sign noted by sonographer. Common bile duct: Diameter: Normal caliber, 4 mm Liver: Increased echotexture compatible with fatty infiltration. Hypoechoic area within the right hepatic lobe likely reflects focal fatty sparing. No focal  abnormality or biliary ductal dilatation. Portal vein is patent on color Doppler imaging with normal direction of blood flow towards the liver. IVC: No abnormality visualized. Pancreas: Visualized portion unremarkable. Spleen: Size and appearance within normal limits. Right Kidney: Length: 11.8 cm. Echogenicity within normal limits. No mass or hydronephrosis visualized. Left Kidney: Length: 11.0 cm. Echogenicity within normal limits. No mass or hydronephrosis visualized. Abdominal aorta: No aneurysm visualized. Other findings: None. IMPRESSION: Fatty infiltration of the liver. No acute findings. Electronically Signed   By: Charlett Nose M.D.   On: 06/13/2018 21:55   Dg Chest Port 1 View  Result Date: 06/14/2018 CLINICAL DATA:  Central line placement. EXAM: PORTABLE CHEST 1 VIEW COMPARISON:  Chest radiograph June 13, 2018 FINDINGS: RIGHT internal jugular central venous catheter distal tip projects in mid  superior vena cava. Endotracheal tube tip projects 5.3 cm above the carina. Cardiomediastinal silhouette is normal. Calcified aortic arch. No pleural effusion or focal consolidation. Minimal biapical pleural thickening. No pneumothorax. Soft tissue planes and included osseous structures are unchanged. IMPRESSION: 1. RIGHT IJ central venous catheter distal tip projects in mid superior vena cava. No pneumothorax. Endotracheal tube tip projects 5.3 cm above the carina. 2. No acute cardiopulmonary process. 3.  Aortic Atherosclerosis (ICD10-I70.0). Electronically Signed   By: Awilda Metro M.D.   On: 06/14/2018 02:47   Dg Chest Portable 1 View  Result Date: 06/13/2018 CLINICAL DATA:  Hypoxia and unresponsive EXAM: PORTABLE CHEST 1 VIEW COMPARISON:  None. FINDINGS: Endotracheal tube tip is 4.1 cm above the carina. No pneumothorax. There is no edema or consolidation. Heart size and pulmonary vascularity are normal. No adenopathy. There is aortic atherosclerosis. No bone lesions. IMPRESSION: Endotracheal tube  as described without pneumothorax. No edema or consolidation. Heart size normal. Aortic Atherosclerosis (ICD10-I70.0). Electronically Signed   By: Bretta Bang III M.D.   On: 06/13/2018 15:50    EKG: Sinus rhythm, rate 90s  ASSESSMENT AND PLAN:  1. Elevated troponin of 0.06- 0.22- 1.14- followed by 4.78, probable demand supply in the setting of acute hypoxic respiratory failure in the setting of diabetic coma and DKA, severe metabolic acidosis, rhabdomyolysis, possible pancreatitis, and possible aspiration pneumonia, however cannot rule out ischemia. Hesitant to proceed with cardiac catheterization at this time. ECG reveals sinus rhythm with interior Q waves with ST-T wave abnormalities laterally. The patient has no known cardiac history; family is not at the bedside to discuss and patient is intubated. Patient on heparin drip 2. Diabetic coma and DKA; patient was found unresponsive with markedly elevated glucose of 1100 3. Severe metabolic acidosis 4. Rhabdomyolysis, CK 1937  Recommendations: 1. Obtain 2D echocardiogram 2. Continue heparin drip for 48-72 hours pending patient's initial course.     Signed: Leanora Ivanoff PA-C 06/14/2018, 1:31 PM

## 2018-06-14 NOTE — Progress Notes (Signed)
*  PRELIMINARY RESULTS* Echocardiogram 2D Echocardiogram has been performed.  Cristela Blue 06/14/2018, 10:14 AM

## 2018-06-14 NOTE — Progress Notes (Signed)
[employer] [facility] [place] [facility]     PATIENT NAME: Joseph Luna    MR#:  [id number]  DATE OF BIRTH:  1950/08/14  SUBJECTIVE:   Patient presented to the hospital to altered mental status and to be found unresponsive and noted to be in acute diabetic ketoacidosis.  Presently patient remains intubated and off sedation but does not follow any commands.  Noted to be severely Hypernatremic.   REVIEW OF SYSTEMS:    Review of Systems  Unable to perform ROS: Intubated    Nutrition: NPO Tolerating Diet: No as pt. Is intubated Tolerating PT: Await Eval.   DRUG ALLERGIES:  No Known Allergies  VITALS:  Blood pressure (!) 99/59, pulse 92, temperature 99.3 F (37.4 C), resp. rate 16, height 6' (1.829 m), weight 67.6 kg, SpO2 100 %.  PHYSICAL EXAMINATION:   Physical Exam  GENERAL:  68 y.o.-year-old patient lying in bed sedated & Intubated.  EYES: Pupils equal, round, reactive to light. No scleral icterus. HEENT: Head atraumatic, normocephalic. ET and OG tubes in place.  NECK:  Supple, no jugular venous distention. No thyroid enlargement, no tenderness.  LUNGS: Good a/e b/l,  no wheezing, rales, rhonchi. No use of accessory muscles of respiration.  CARDIOVASCULAR: S1, S2 normal. No murmurs, rubs, or gallops.  ABDOMEN: Soft, nontender, nondistended. Bowel sounds present. No organomegaly or mass.  EXTREMITIES: No cyanosis, clubbing or edema b/l.    NEUROLOGIC: Sedated & Intubated.   PSYCHIATRIC: Sedated & Intubated.   SKIN: No obvious rash, lesion, or ulcer.    LABORATORY PANEL:   CBC Recent Labs  Lab 06/13/18 1455  WBC 27.5*  HGB 15.9  HCT 52.5*  PLT 272   ------------------------------------------------------------------------------------------------------------------  Chemistries  Recent Labs  Lab 06/14/18 0705 06/14/18 1248  NA 161* 161*  K 3.2* 3.8  CL 126* 127*  CO2 28 27  GLUCOSE 163* 123*  BUN 34* 32*  CREATININE 1.53* 1.65*  CALCIUM 8.0*  7.6*  MG  --  2.2  AST 40  --   ALT 20  --   ALKPHOS 51  --   BILITOT 0.6  --    ------------------------------------------------------------------------------------------------------------------  Cardiac Enzymes Recent Labs  Lab 06/14/18 0855  TROPONINI 4.78*   ------------------------------------------------------------------------------------------------------------------  RADIOLOGY:  Dg Abd 1 View  Result Date: 06/14/2018 CLINICAL DATA:  Orogastric tube placement. EXAM: ABDOMEN - 1 VIEW COMPARISON:  None. FINDINGS: Nasogastric tube tip projects in gastric cardia, side-port at GE junction region. Single loop of gas distended colon. Lung bases are clear. Included soft tissue planes and osseous structures are non suspicious. IMPRESSION: Nasogastric tube tip projects in gastric cardia. Electronically Signed   By: Awilda Metro M.D.   On: 06/14/2018 06:20   Ct Head Wo Contrast  Result Date: 06/13/2018 CLINICAL DATA:  Found unresponsive this morning. EXAM: CT HEAD WITHOUT CONTRAST TECHNIQUE: Contiguous axial images were obtained from the base of the skull through the vertex without intravenous contrast. COMPARISON:  None. FINDINGS: Brain: Mild chronic small vessel ischemic changes within the deep periventricular white matter. No mass, hemorrhage, edema or other evidence of acute parenchymal abnormality. No extra-axial hemorrhage. Vascular: Chronic calcified atherosclerotic changes of the large vessels at the skull base. No unexpected hyperdense vessel. Skull: Normal. Negative for fracture or focal lesion. Sinuses/Orbits: No acute finding. Other: None. IMPRESSION: No acute findings. No intracranial mass, hemorrhage or edema. Electronically Signed   By: Bary Richard M.D.   On: 06/13/2018 16:08   US Abdomen Complete  Result Date: 06/13/2018  CLINICAL DATA:  Elevated lipase EXAM: ABDOMEN ULTRASOUND COMPLETE COMPARISON:  None. FINDINGS: Gallbladder: No gallstones or wall thickening  visualized. No sonographic Murphy sign noted by sonographer. Common bile duct: Diameter: Normal caliber, 4 mm Liver: Increased echotexture compatible with fatty infiltration. Hypoechoic area within the right hepatic lobe likely reflects focal fatty sparing. No focal abnormality or biliary ductal dilatation. Portal vein is patent on color Doppler imaging with normal direction of blood flow towards the liver. IVC: No abnormality visualized. Pancreas: Visualized portion unremarkable. Spleen: Size and appearance within normal limits. Right Kidney: Length: 11.8 cm. Echogenicity within normal limits. No mass or hydronephrosis visualized. Left Kidney: Length: 11.0 cm. Echogenicity within normal limits. No mass or hydronephrosis visualized. Abdominal aorta: No aneurysm visualized. Other findings: None. IMPRESSION: Fatty infiltration of the liver. No acute findings. Electronically Signed   By: Charlett Nose M.D.   On: 06/13/2018 21:55   Dg Chest Port 1 View  Result Date: 06/14/2018 CLINICAL DATA:  Central line placement. EXAM: PORTABLE CHEST 1 VIEW COMPARISON:  Chest radiograph June 13, 2018 FINDINGS: RIGHT internal jugular central venous catheter distal tip projects in mid superior vena cava. Endotracheal tube tip projects 5.3 cm above the carina. Cardiomediastinal silhouette is normal. Calcified aortic arch. No pleural effusion or focal consolidation. Minimal biapical pleural thickening. No pneumothorax. Soft tissue planes and included osseous structures are unchanged. IMPRESSION: 1. RIGHT IJ central venous catheter distal tip projects in mid superior vena cava. No pneumothorax. Endotracheal tube tip projects 5.3 cm above the carina. 2. No acute cardiopulmonary process. 3.  Aortic Atherosclerosis (ICD10-I70.0). Electronically Signed   By: Awilda Metro M.D.   On: 06/14/2018 02:47   Dg Chest Portable 1 View  Result Date: 06/13/2018 CLINICAL DATA:  Hypoxia and unresponsive EXAM: PORTABLE CHEST 1 VIEW  COMPARISON:  None. FINDINGS: Endotracheal tube tip is 4.1 cm above the carina. No pneumothorax. There is no edema or consolidation. Heart size and pulmonary vascularity are normal. No adenopathy. There is aortic atherosclerosis. No bone lesions. IMPRESSION: Endotracheal tube as described without pneumothorax. No edema or consolidation. Heart size normal. Aortic Atherosclerosis (ICD10-I70.0). Electronically Signed   By: Bretta Bang III M.D.   On: 06/13/2018 15:50     ASSESSMENT AND PLAN:   68 yo male w/ hx of Hypothyroidism who was found unresponsive and noted to be encephalopathic.  1.  Altered mental status/encephalopathy-this is metabolic in nature secondary to acute DKA and severe hyperglycemia.  Patient is currently sedated and intubated and follow mental status once patient is extubated.  2.  Acute DKA- patient apparently was on no diabetic meds prior to coming to the hospital.  Unclear if this is new onset diabetes for the patient. - Continue aggressive IV fluid hydration, insulin drip, follow serial metabolic profiles and anion gap is improving.  3. NSTEMI -patient has ruled in by cardiac markers, troponin has trended up as high as 4. - Await echocardiogram results, continue heparin drip, ASA suppository  4.  Acute kidney injury-secondary to severe dehydration from the underlying DKA. - Improving with IV fluid hydration we will continue to monitor.  5.  Hyponatremia-secondary to severe dehydration and hyperglycemia. - Continue half-normal saline, continue free water flushes through the NG tube.  Follow sodium.  6.  Leukocytosis-etiology unclear but could be stress mediated from DKA.  No acute infectious source identified.   -Continue empiric Zosyn for now.  7.  Hypotension-likely secondary to hypovolemic shock from DKA and dehydration. -Continue IV fluids, to Neo-Synephrine drip.  Follow hemodynamics.  8.  GERD-continue Protonix.    All the records are reviewed and case  discussed with Care Management/Social Worker. Management plans discussed with the patient, family and they are in agreement.  CODE STATUS: Full code  DVT Prophylaxis: Heparin gtt  TOTAL TIME TAKING CARE OF THIS PATIENT: 30 minutes.   POSSIBLE D/C unclear, DEPENDING ON CLINICAL CONDITION and progress.   Houston Siren M.D on 06/14/2018 at 3:39 PM  Between 7am to 6pm - Pager - [phone]  After 6pm go to [email or web address] - Social research officer, government  [employer]  Hospitalists  Office  [phone]  CC: Primary care physician; Patient, No Pcp Per

## 2018-06-14 NOTE — Progress Notes (Signed)
Pharmacy Electrolyte Monitoring Consult:  Pharmacy consulted to assist in monitoring and replacing electrolytes in this 68 y.o. male admitted on 06/13/2018 with respiratory distress, was found unresponsive w/ BG of 1150 and was found to be in DKA although no h/o of diabetes. Patient is s/p intubation d/t AMS from diabetic encephalopathy w/ elevated lipase, fatty liver on [place].  Labs:  Sodium (mmol/L)  Date Value  06/14/2018 161 (HH)   Potassium (mmol/L)  Date Value  06/14/2018 3.1 (L)   Magnesium (mg/dL)  Date Value  45/80/9983 2.9 (H)   Calcium (mg/dL)  Date Value  38/25/0539 7.9 (L)   Albumin (g/dL)  Date Value  76/73/4193 4.4    Assessment/Plan: Patient was started on insulin drip currently on 8.5 units/hr and received D5 + 1/2NS + 40 mEq @ 125 ml/hr. Patient was also ordered KCI 10 mEq IV x 5. Phos level pending. Will continue to monitor electrolytes w/ q4h labs.  Thomasene Ripple, PharmD, BCPS Clinical Pharmacist 06/14/2018

## 2018-06-14 NOTE — Progress Notes (Signed)
ANTICOAGULATION CONSULT NOTE - Initial Consult  Pharmacy Consult for heparin drip Indication: chest pain/ACS  No Known Allergies  Patient Measurements: Height: 6' (182.9 cm) Weight: 149 lb 0.5 oz (67.6 kg) IBW/kg (Calculated) : 77.6 Heparin Dosing Weight: 68 kg  Vital Signs: Temp: 99.7 F (37.6 C) (02/28 1800) BP: 88/53 (02/28 1800) Pulse Rate: 90 (02/28 1800)  Labs: Recent Labs    06/13/18 1455 06/13/18 1541 06/13/18 2032  06/14/18 0233 06/14/18 0557 06/14/18 0705 06/14/18 0855 06/14/18 1037 06/14/18 1248 06/14/18 1426 06/14/18 1909  HGB 15.9  --   --   --   --   --   --   --   --   --   --   --   HCT 52.5*  --   --   --   --   --   --   --   --   --   --   --   PLT 272  --   --   --   --   --   --   --   --   --   --   --   APTT  --   --   --   --   --  42*  --   --   --   --   --   --   LABPROT 15.2  --   --   --   --   --   --   --   --   --   --   --   INR 1.2  --   --   --   --   --   --   --   --   --   --   --   HEPARINUNFRC  --   --   --   --   --   --   --   --  0.58  --   --  0.86*  CREATININE 2.81*  --   --    < > 1.72*  --  1.53*  --   --  1.65* 1.59*  --   CKTOTAL  --  555*  --   --   --   --  1,937*  --   --   --   --   --   TROPONINI 0.06*  --  0.22*  --  1.14*  --   --  4.78*  --   --   --   --    < > = values in this interval not displayed.    Estimated Creatinine Clearance: 43.1 mL/min (A) (by C-G formula based on SCr of 1.59 mg/dL (H)).   Medical History: Past Medical History:  Diagnosis Date  . Hypothyroid     Medications:  Scheduled:  . chlorhexidine gluconate (MEDLINE KIT)  15 mL Mouth Rinse BID  . free water  200 mL Per Tube Q4H  . insulin aspart  0-9 Units Subcutaneous Q4H  . insulin glargine  15 Units Subcutaneous QHS  . mouth rinse  15 mL Mouth Rinse 10 times per day  . pantoprazole (PROTONIX) IV  40 mg Intravenous Q24H  . sodium chloride flush  10-40 mL Intracatheter Q12H    Assessment: Patient admitted s/t respiratory dx  s/p intubation, and now patient's trops rising 0.06 >> 0.22 >> 1.14, EKG pending. No PTA anticoagulation, being started on a heparin drip for ACS.  2/28 10:37 HL 0.58 2/28 19:09 HL  0.86 (@ 1000 units/hr)  Pt is mildly supratherapeutic - will decrease rate but not hold drip  Goal of Therapy:  Heparin level 0.3-0.7 units/ml Monitor platelets by anticoagulation protocol: Yes   Plan:  Will decrease rate to 900 units/hr. Check next Heparin level in 6 hours for confirmation. Daily CBC while on Heparin drip.   M , PharmD, BCPS Clinical Pharmacist 06/14/2018 7:38 PM  

## 2018-06-14 NOTE — Progress Notes (Signed)
Initial Nutrition Assessment  DOCUMENTATION CODES:   Severe malnutrition in context of chronic illness  INTERVENTION:   Recommend vital 1.2 @ goal rate of 71ml/hr- Initiate at rate of 31ml/hr and increase by 24ml q 8 hours until goal rate is reached  Free water flushes q 4 hours  Regimen provides 1728kcal/day, 108g/day protien, 1784ml/day free water   Pt at high refeed risk; recommend monitor K, Mg and P labs daily until stable   NUTRITION DIAGNOSIS:   Severe Malnutrition related to chronic illness(uncontrolled DM) as evidenced by moderate to severe fat depletions, moderate to severe muscle depletions.  GOAL:   Patient will meet greater than or equal to 90% of their needs  MONITOR:   Labs, Weight trends, Skin, I & O's, TF tolerance  REASON FOR ASSESSMENT:   Ventilator    ASSESSMENT:   68 year old male with past medical history of diabetes, was found unresponsive at home, required intubation in the ED for airway protection and impending respiratory failure. Patient with severe DKA and diabetic coma, severe metabolic acidosis, and AKI.  Patient meets SIRS criteria, concern for possible pancreatitis and possible aspiration pneumonia.  Pt sedated and ventilated. OGT in place at GE junction; spoke with RD about advancing tube a few cm. Plan to initiate tube feeds today. Pt at high refeed risk; recommend monitor K, Mg and P labs daily until stable. Family member at bedside reports pt with good appetite and oral intake at baseline. Family member reports that a few months ago pt had been loosing a lot of weight and was found to be hyperthyroid. After starting medication, pt's weight increased back to his UBW but then recently pt had started loosing weight again. Pt felt like weight loss was r/t him decreasing his thyroid medications but was likely from his uncontrolled DM. Family member reports pt with ~15 weight loss over the past few weeks; there is no weight history in chart to  confirm weight loss. Hypernatremia today; monitor free water.   Medications reviewed and include: protonix, NaCl w/ 5% dextrose @125ml /hr, pepcid, fentanyl, heparin, insulin, neosynephrine, zosyn, KCl  Labs reviewed: Na 161(H), K 3.2(L), Cl 126(H), BUN 34(H), creat 1.53(H), P 2.2(L), amylase 743(H) CK 1937(H) Triglycerides 213(H) Wbc- 27.5(H)- 2/27  Patient is currently intubated on ventilator support MV: 8.0 L/min Temp (24hrs), Avg:97.9 F (36.6 C), Min:97.2 F (36.2 C), Max:99 F (37.2 C)  Propofol: none   MAP- >85mmHg  UOP- x 24 hrs  NUTRITION - FOCUSED PHYSICAL EXAM:    Most Recent Value  Orbital Region  Mild depletion  Upper Arm Region  Severe depletion  Thoracic and Lumbar Region  Moderate depletion  Buccal Region  Moderate depletion  Temple Region  Moderate depletion  Clavicle Bone Region  Moderate depletion  Clavicle and Acromion Bone Region  Moderate depletion  Scapular Bone Region  Moderate depletion  Dorsal Hand  Moderate depletion  Patellar Region  Severe depletion  Anterior Thigh Region  Severe depletion  Posterior Calf Region  Severe depletion  Edema (RD Assessment)  None  Hair  Reviewed  Eyes  Reviewed  Mouth  Reviewed  Skin  Reviewed  Nails  Reviewed     Diet Order:   Diet Order            Diet NPO time specified  Diet effective now             EDUCATION NEEDS:   Not appropriate for education at this time  Skin:  Skin Assessment: Reviewed  RN Assessment(Stage I sacrum )  Last BM:  PTA  Height:   Ht Readings from Last 1 Encounters:  06/13/18 6' (1.829 m)    Weight:   Wt Readings from Last 1 Encounters:  06/13/18 67.6 kg    Ideal Body Weight:  80.9 kg  BMI:  Body mass index is 20.21 kg/m.  Estimated Nutritional Needs:   Kcal:  1649kcal/day   Protein:  101-115g/day  Fluid:  1.7L/day   Betsey Holiday MS, RD, LDN Pager #- [phone] Office#- [phone] After Hours Pager: [phone]

## 2018-06-14 NOTE — Progress Notes (Signed)
Pharmacy Electrolyte Monitoring Consult:  Pharmacy consulted to assist in monitoring and replacing electrolytes in this 68 y.o. male admitted on 06/13/2018 with respiratory distress, was found unresponsive w/ BG of 1150 and was found to be in DKA although no h/o of diabetes. Patient is s/p intubation d/t AMS from diabetic encephalopathy w/ elevated lipase, fatty liver on [place].  Labs:  Sodium (mmol/L)  Date Value  06/14/2018 161 (HH)   Potassium (mmol/L)  Date Value  06/14/2018 3.2 (L)   Magnesium (mg/dL)  Date Value  97/41/6384 2.9 (H)   Calcium (mg/dL)  Date Value  53/64/6803 8.0 (L)   Albumin (g/dL)  Date Value  21/22/4825 2.8 (L)    Assessment/Plan: Patient was started on insulin drip currently on 16.3 units/hr and received D5 + 1/2NS + 40 mEq @ 125 ml/hr. Patient has also received KCI 10 mEq IV x 4. Will order additional KCl IV x 4. Will check Mag and Phos with next lab draw. Will continue to monitor electrolytes w/ q4h labs.  Clovia Cuff, PharmD, BCPS 06/14/2018 9:29 AM

## 2018-06-14 NOTE — Procedures (Signed)
Central Venous Catheter Insertion Procedure Note Joseph Luna [id number] 28-Feb-1951  Procedure: Insertion of Central Venous Catheter Indications: Assessment of intravascular volume, Drug and/or fluid administration and Frequent blood sampling  Procedure Details Consent: Risks of procedure as well as the alternatives and risks of each were explained to the (patient/caregiver).  Consent for procedure obtained. Time Out: Verified patient identification, verified procedure, site/side was marked, verified correct patient position, special equipment/implants available, medications/allergies/relevent history reviewed, required imaging and test results available.  Performed  Maximum sterile technique was used including antiseptics, cap, gloves, gown, hand hygiene, mask and sheet. Skin prep: Chlorhexidine; local anesthetic administered A antimicrobial bonded/coated triple lumen catheter was placed in the right internal jugular vein using the Seldinger technique.  Evaluation Blood flow good Complications: No apparent complications Patient did tolerate procedure well. Chest X-ray ordered to verify placement.  CXR: pending.  Procedure was performed using Ultrasound for direct real time visualization of cannulization of right IJ.  Line was advanced to the 18 cm mark, and sutured in place.   Harlon Ditty, AGACNP-BC [facility] Pager: [phone] Cell: [phone]   Judithe Modest 06/14/2018, 2:19 AM

## 2018-06-14 NOTE — Progress Notes (Signed)
Pharmacy Electrolyte Monitoring Consult:  Pharmacy consulted to assist in monitoring and replacing electrolytes in this 68 y.o. male admitted on 06/13/2018 with respiratory distress, was found unresponsive w/ BG of 1150 and was found to be in DKA although no h/o of diabetes. Patient is s/p intubation d/t AMS from diabetic encephalopathy w/ elevated lipase, fatty liver on [place].  Labs:  Sodium (mmol/L)  Date Value  06/14/2018 161 (HH)   Potassium (mmol/L)  Date Value  06/14/2018 3.8   Magnesium (mg/dL)  Date Value  52/77/8242 2.2   Phosphorus (mg/dL)  Date Value  35/36/1443 1.9 (L)   Calcium (mg/dL)  Date Value  15/40/0867 7.6 (L)   Albumin (g/dL)  Date Value  61/95/0932 2.8 (L)    Assessment/Plan: Patient was started on insulin drip currently on 16.3 units/hr and received D5 + 1/2NS + 40 mEq @ 125 ml/hr. Patient has also received KCI 10 mEq IV x 8.  Phos is low at 1.9, will replace with KPhos 20 mmol IV times one. Will check Mag and Phos with AM lab draw. Will continue to monitor electrolytes w/ q4h labs.  Clovia Cuff, PharmD, BCPS 06/14/2018 3:01 PM

## 2018-06-14 NOTE — Progress Notes (Signed)
Inpatient Diabetes Program Recommendations  AACE/ADA: New Consensus Statement on Inpatient Glycemic Control (2015)  Target Ranges:  Prepandial:   less than 140 mg/dL      Peak postprandial:   less than 180 mg/dL (1-2 hours)      Critically ill patients:  140 - 180 mg/dL   Lab Results  Component Value Date   GLUCAP 145 (H) 06/14/2018   Results for ALVY, TRAMMEL (MRN [id number]) as of 06/14/2018 16:04  Ref. Range 06/14/2018 10:06 06/14/2018 11:16 06/14/2018 13:36 06/14/2018 14:43 06/14/2018 15:47  Glucose-Capillary Latest Ref Range: 70 - 99 mg/dL 919 (H) 94 166 (H) 060 (H) 145 (H)  Results for NAKOMA, WILBERS (MRN [id number]) as of 06/14/2018 16:04  Ref. Range 06/13/2018 14:55  Glucose Latest Ref Range: 70 - 99 mg/dL 4,239 (HH)   Diabetes history: New DM Current orders for Inpatient glycemic control:  IV insulin/DKA order set To transition to Lantus 15 units q HS and Novolog sensitive q 4 hours Inpatient Diabetes Program Recommendations:   Patient is currently still intubated and not appropriate for education. Note plans for patient to transition off insulin drip.  Called and discussed with RN.  Patient will need to receive Lantus 1-2 hours prior to d/c of insulin drip (due to short 1/2 life of IV insulin).   Will follow.   Thanks,  Beryl Meager, RN, BC-ADM Inpatient Diabetes Coordinator Pager [phone] (8a-5p)

## 2018-06-14 NOTE — Progress Notes (Signed)
ANTICOAGULATION CONSULT NOTE - Initial Consult  Pharmacy Consult for heparin drip Indication: chest pain/ACS  No Known Allergies  Patient Measurements: Height: 6' (182.9 cm) Weight: 149 lb 0.5 oz (67.6 kg) IBW/kg (Calculated) : 77.6 Heparin Dosing Weight: 68 kg  Vital Signs: Temp: 99 F (37.2 C) (02/28 0000) Temp Source: Oral (02/28 0000) BP: 110/58 (02/28 0345) Pulse Rate: 92 (02/28 0345)  Labs: Recent Labs    06/13/18 1455 06/13/18 1541 06/13/18 2032 06/13/18 2211 06/14/18 0233  HGB 15.9  --   --   --   --   HCT 52.5*  --   --   --   --   PLT 272  --   --   --   --   LABPROT 15.2  --   --   --   --   INR 1.2  --   --   --   --   CREATININE 2.81*  --   --  2.49*  --   CKTOTAL  --  555*  --   --   --   TROPONINI 0.06*  --  0.22*  --  1.14*    Estimated Creatinine Clearance: 27.5 mL/min (A) (by C-G formula based on SCr of 2.49 mg/dL (H)).   Medical History: Past Medical History:  Diagnosis Date  . Hypothyroid     Medications:  Scheduled:  . pantoprazole (PROTONIX) IV  40 mg Intravenous Q24H  . sodium chloride flush  10-40 mL Intracatheter Q12H  . vancomycin variable dose per unstable renal function (pharmacist dosing)   Does not apply See admin instructions    Assessment: Patient admitted s/t respiratory dx s/p intubation, and now patient's trops rising 0.06 >> 0.22 >> 1.14, EKG pending. No PTA anticoagulation, being started on a heparin drip for ACS.  Goal of Therapy:  Heparin level 0.3-0.7 units/ml Monitor platelets by anticoagulation protocol: Yes   Plan:  Patient was on heparin 5000 units subq q8h for VTE prophylaxis--last dose 02/27 @ 2130. Will omit bolus and start heparin drip at 1000 units/hr Baseline aPTT drawn, other labs WNL Will check anti-Xa @ 1000  Will monitor daily CBC's and adjust per anti-Xa levels.  Thomasene Ripple, PharmD, BCPS Clinical Pharmacist 06/14/2018

## 2018-06-15 DIAGNOSIS — L8990 Pressure ulcer of unspecified site, unspecified stage: Secondary | ICD-10-CM

## 2018-06-15 DIAGNOSIS — E43 Unspecified severe protein-calorie malnutrition: Secondary | ICD-10-CM

## 2018-06-15 DIAGNOSIS — J9601 Acute respiratory failure with hypoxia: Secondary | ICD-10-CM

## 2018-06-15 LAB — THYROID PANEL WITH TSH
Free Thyroxine Index: 1.6 (ref 1.2–4.9)
T3 Uptake Ratio: 33 % (ref 24–39)
T4 TOTAL: 4.9 ug/dL (ref 4.5–12.0)
TSH: 0.1 u[IU]/mL — AB (ref 0.450–4.500)

## 2018-06-15 LAB — BASIC METABOLIC PANEL
Anion gap: 4 — ABNORMAL LOW (ref 5–15)
Anion gap: 6 (ref 5–15)
Anion gap: 6 (ref 5–15)
Anion gap: 7 (ref 5–15)
BUN: 25 mg/dL — AB (ref 8–23)
BUN: 28 mg/dL — AB (ref 8–23)
BUN: 29 mg/dL — AB (ref 8–23)
BUN: 31 mg/dL — ABNORMAL HIGH (ref 8–23)
CALCIUM: 7.4 mg/dL — AB (ref 8.9–10.3)
CHLORIDE: 125 mmol/L — AB (ref 98–111)
CO2: 24 mmol/L (ref 22–32)
CO2: 24 mmol/L (ref 22–32)
CO2: 24 mmol/L (ref 22–32)
CO2: 25 mmol/L (ref 22–32)
CREATININE: 1.28 mg/dL — AB (ref 0.61–1.24)
Calcium: 7 mg/dL — ABNORMAL LOW (ref 8.9–10.3)
Calcium: 7 mg/dL — ABNORMAL LOW (ref 8.9–10.3)
Calcium: 7.1 mg/dL — ABNORMAL LOW (ref 8.9–10.3)
Chloride: 123 mmol/L — ABNORMAL HIGH (ref 98–111)
Chloride: 123 mmol/L — ABNORMAL HIGH (ref 98–111)
Chloride: 126 mmol/L — ABNORMAL HIGH (ref 98–111)
Creatinine, Ser: 1.08 mg/dL (ref 0.61–1.24)
Creatinine, Ser: 1.48 mg/dL — ABNORMAL HIGH (ref 0.61–1.24)
Creatinine, Ser: 1.55 mg/dL — ABNORMAL HIGH (ref 0.61–1.24)
GFR calc Af Amer: 53 mL/min — ABNORMAL LOW (ref >60)
GFR calc Af Amer: 56 mL/min — ABNORMAL LOW (ref >60)
GFR calc Af Amer: >60 mL/min (ref >60)
GFR calc Af Amer: >60 mL/min (ref >60)
GFR calc non Af Amer: 46 mL/min — ABNORMAL LOW (ref >60)
GFR calc non Af Amer: 48 mL/min — ABNORMAL LOW (ref >60)
GFR calc non Af Amer: 58 mL/min — ABNORMAL LOW (ref >60)
GFR calc non Af Amer: >60 mL/min (ref >60)
Glucose, Bld: 244 mg/dL — ABNORMAL HIGH (ref 70–99)
Glucose, Bld: 284 mg/dL — ABNORMAL HIGH (ref 70–99)
Glucose, Bld: 315 mg/dL — ABNORMAL HIGH (ref 70–99)
Glucose, Bld: 333 mg/dL — ABNORMAL HIGH (ref 70–99)
POTASSIUM: 4.2 mmol/L (ref 3.5–5.1)
POTASSIUM: 4.1 mmol/L (ref 3.5–5.1)
Potassium: 3.9 mmol/L (ref 3.5–5.1)
Potassium: 4.1 mmol/L (ref 3.5–5.1)
Sodium: 153 mmol/L — ABNORMAL HIGH (ref 135–145)
Sodium: 154 mmol/L — ABNORMAL HIGH (ref 135–145)
Sodium: 155 mmol/L — ABNORMAL HIGH (ref 135–145)
Sodium: 155 mmol/L — ABNORMAL HIGH (ref 135–145)

## 2018-06-15 LAB — CBC WITH DIFFERENTIAL/PLATELET
Abs Immature Granulocytes: 0.08 10*3/uL — ABNORMAL HIGH (ref 0.00–0.07)
BASOS ABS: 0 10*3/uL (ref 0.0–0.1)
Basophils Relative: 0 %
Eosinophils Absolute: 0 10*3/uL (ref 0.0–0.5)
Eosinophils Relative: 0 %
HCT: 35 % — ABNORMAL LOW (ref 39.0–52.0)
HEMOGLOBIN: 11.4 g/dL — AB (ref 13.0–17.0)
Immature Granulocytes: 1 %
LYMPHS PCT: 21 %
Lymphs Abs: 2.2 10*3/uL (ref 0.7–4.0)
MCH: 31.6 pg (ref 26.0–34.0)
MCHC: 32.6 g/dL (ref 30.0–36.0)
MCV: 97 fL (ref 80.0–100.0)
Monocytes Absolute: 0.6 10*3/uL (ref 0.1–1.0)
Monocytes Relative: 6 %
Neutro Abs: 7.6 10*3/uL (ref 1.7–7.7)
Neutrophils Relative %: 72 %
Platelets: 81 10*3/uL — ABNORMAL LOW (ref 150–400)
RBC: 3.61 MIL/uL — ABNORMAL LOW (ref 4.22–5.81)
RDW: 14 % (ref 11.5–15.5)
WBC: 10.6 10*3/uL — ABNORMAL HIGH (ref 4.0–10.5)
nRBC: 0.2 % (ref 0.0–0.2)

## 2018-06-15 LAB — COMPREHENSIVE METABOLIC PANEL
ALBUMIN: 2.4 g/dL — AB (ref 3.5–5.0)
ALT: 24 U/L (ref 0–44)
AST: 66 U/L — ABNORMAL HIGH (ref 15–41)
Alkaline Phosphatase: 44 U/L (ref 38–126)
Anion gap: 5 (ref 5–15)
BUN: 29 mg/dL — ABNORMAL HIGH (ref 8–23)
CO2: 24 mmol/L (ref 22–32)
Calcium: 7.1 mg/dL — ABNORMAL LOW (ref 8.9–10.3)
Chloride: 123 mmol/L — ABNORMAL HIGH (ref 98–111)
Creatinine, Ser: 1.34 mg/dL — ABNORMAL HIGH (ref 0.61–1.24)
GFR calc Af Amer: >60 mL/min (ref >60)
GFR calc non Af Amer: 54 mL/min — ABNORMAL LOW (ref >60)
GLUCOSE: 320 mg/dL — AB (ref 70–99)
Potassium: 3.9 mmol/L (ref 3.5–5.1)
SODIUM: 152 mmol/L — AB (ref 135–145)
Total Bilirubin: 0.7 mg/dL (ref 0.3–1.2)
Total Protein: 4.8 g/dL — ABNORMAL LOW (ref 6.5–8.1)

## 2018-06-15 LAB — GLUCOSE, CAPILLARY
Glucose-Capillary: 315 mg/dL — ABNORMAL HIGH (ref 70–99)
Glucose-Capillary: 268 mg/dL — ABNORMAL HIGH (ref 70–99)
Glucose-Capillary: 503 mg/dL (ref 70–99)
Glucose-Capillary: 232 mg/dL — ABNORMAL HIGH (ref 70–99)
Glucose-Capillary: 223 mg/dL — ABNORMAL HIGH (ref 70–99)
Glucose-Capillary: 194 mg/dL — ABNORMAL HIGH (ref 70–99)
Glucose-Capillary: 130 mg/dL — ABNORMAL HIGH (ref 70–99)
Glucose-Capillary: 116 mg/dL — ABNORMAL HIGH (ref 70–99)
Glucose-Capillary: 105 mg/dL — ABNORMAL HIGH (ref 70–99)
Glucose-Capillary: 110 mg/dL — ABNORMAL HIGH (ref 70–99)
Glucose-Capillary: 129 mg/dL — ABNORMAL HIGH (ref 70–99)
Glucose-Capillary: 156 mg/dL — ABNORMAL HIGH (ref 70–99)
Glucose-Capillary: 171 mg/dL — ABNORMAL HIGH (ref 70–99)

## 2018-06-15 LAB — CBC
HCT: 33 % — ABNORMAL LOW (ref 39.0–52.0)
HEMOGLOBIN: 10.9 g/dL — AB (ref 13.0–17.0)
MCH: 32 pg (ref 26.0–34.0)
MCHC: 33 g/dL (ref 30.0–36.0)
MCV: 96.8 fL (ref 80.0–100.0)
Platelets: 76 10*3/uL — ABNORMAL LOW (ref 150–400)
RBC: 3.41 MIL/uL — AB (ref 4.22–5.81)
RDW: 13.8 % (ref 11.5–15.5)
WBC: 10.3 10*3/uL (ref 4.0–10.5)
nRBC: 0 % (ref 0.0–0.2)

## 2018-06-15 LAB — APTT
aPTT: 82 seconds — ABNORMAL HIGH (ref 24–36)
aPTT: 50 seconds — ABNORMAL HIGH (ref 24–36)
aPTT: 46 seconds — ABNORMAL HIGH (ref 24–36)
aPTT: 81 seconds — ABNORMAL HIGH (ref 24–36)
aPTT: <24 seconds — ABNORMAL LOW (ref 24–36)

## 2018-06-15 LAB — PROTIME-INR
INR: 1.3 — AB (ref 0.8–1.2)
Prothrombin Time: 16.3 seconds — ABNORMAL HIGH (ref 11.4–15.2)

## 2018-06-15 LAB — PROCALCITONIN: Procalcitonin: 4.78 ng/mL

## 2018-06-15 LAB — MAGNESIUM: Magnesium: 2 mg/dL (ref 1.7–2.4)

## 2018-06-15 LAB — PHOSPHORUS
Phosphorus: 2.6 mg/dL (ref 2.5–4.6)
Phosphorus: 1.9 mg/dL — ABNORMAL LOW (ref 2.5–4.6)

## 2018-06-15 LAB — HEPARIN LEVEL (UNFRACTIONATED): Heparin Unfractionated: 0.79 IU/mL — ABNORMAL HIGH (ref 0.30–0.70)

## 2018-06-15 LAB — HIV ANTIBODY (ROUTINE TESTING W REFLEX): HIV SCREEN 4TH GENERATION: NONREACTIVE

## 2018-06-15 MED ORDER — DEXMEDETOMIDINE HCL IN NACL 400 MCG/100ML IV SOLN
0.4000 ug/kg/h | INTRAVENOUS | Status: DC
  Administered 2018-06-15 (×2): 0.6 ug/kg/h via INTRAVENOUS
  Administered 2018-06-16 (×2): 0.4 ug/kg/h via INTRAVENOUS
  Filled 2018-06-15 (×4): qty 100

## 2018-06-15 MED ORDER — LACTATED RINGERS IV SOLN
INTRAVENOUS | Status: DC
  Administered 2018-06-15 (×2): via INTRAVENOUS

## 2018-06-15 MED ORDER — SODIUM CHLORIDE 0.9 % IV SOLN
250.0000 mL | INTRAVENOUS | Status: DC | PRN
  Administered 2018-06-16: 250 mL via INTRAVENOUS

## 2018-06-15 MED ORDER — HALOPERIDOL LACTATE 5 MG/ML IJ SOLN
2.0000 mg | Freq: Once | INTRAMUSCULAR | Status: AC
  Administered 2018-06-15: 2 mg via INTRAVENOUS

## 2018-06-15 MED ORDER — FAMOTIDINE IN NACL 20-0.9 MG/50ML-% IV SOLN: 20.0000 mg | INTRAVENOUS | Status: DC

## 2018-06-15 MED ORDER — LACTATED RINGERS IV BOLUS
1000.0000 mL | Freq: Once | INTRAVENOUS | Status: AC
  Administered 2018-06-15: 1000 mL via INTRAVENOUS

## 2018-06-15 MED ORDER — CHLORHEXIDINE GLUCONATE 0.12 % MT SOLN
15.0000 mL | Freq: Two times a day (BID) | OROMUCOSAL | Status: DC
  Administered 2018-06-15 – 2018-06-18 (×6): 15 mL via OROMUCOSAL
  Filled 2018-06-15 (×5): qty 15

## 2018-06-15 MED ORDER — FAMOTIDINE 20 MG PO TABS
20.0000 mg | ORAL_TABLET | Freq: Two times a day (BID) | ORAL | Status: DC
  Administered 2018-06-16 – 2018-06-19 (×7): 20 mg via ORAL
  Filled 2018-06-15 (×7): qty 1

## 2018-06-15 MED ORDER — DEXTROSE 5 % IV SOLN
INTRAVENOUS | Status: DC
  Administered 2018-06-15 – 2018-06-17 (×3): via INTRAVENOUS

## 2018-06-15 MED ORDER — INSULIN REGULAR(HUMAN) IN NACL 100-0.9 UT/100ML-% IV SOLN: INTRAVENOUS | Status: DC

## 2018-06-15 MED ORDER — POTASSIUM CHLORIDE CRYS ER 20 MEQ PO TBCR: 20.0000 meq | EXTENDED_RELEASE_TABLET | Freq: Once | ORAL | Status: DC

## 2018-06-15 MED ORDER — INSULIN REGULAR(HUMAN) IN NACL 100-0.9 UT/100ML-% IV SOLN
INTRAVENOUS | Status: DC
  Administered 2018-06-15: 1.7 [IU]/h via INTRAVENOUS
  Filled 2018-06-15: qty 100

## 2018-06-15 MED ORDER — ORAL CARE MOUTH RINSE
15.0000 mL | Freq: Two times a day (BID) | OROMUCOSAL | Status: DC
  Administered 2018-06-15 – 2018-06-17 (×4): 15 mL via OROMUCOSAL

## 2018-06-15 MED ORDER — HALOPERIDOL LACTATE 5 MG/ML IJ SOLN
INTRAMUSCULAR | Status: AC
  Administered 2018-06-15: 2 mg via INTRAVENOUS
  Filled 2018-06-15: qty 1

## 2018-06-15 MED ORDER — LORAZEPAM 2 MG/ML IJ SOLN
INTRAMUSCULAR | Status: AC
  Administered 2018-06-15: 1 mg via INTRAVENOUS
  Filled 2018-06-15: qty 1

## 2018-06-15 MED ORDER — SODIUM CHLORIDE 0.9 % IV SOLN
3.0000 g | Freq: Four times a day (QID) | INTRAVENOUS | Status: DC
  Administered 2018-06-15 – 2018-06-17 (×8): 3 g via INTRAVENOUS
  Filled 2018-06-15 (×13): qty 3

## 2018-06-15 MED ORDER — LORAZEPAM 2 MG/ML IJ SOLN
1.0000 mg | Freq: Once | INTRAMUSCULAR | Status: AC
  Administered 2018-06-15: 1 mg via INTRAVENOUS

## 2018-06-15 MED ORDER — SODIUM CHLORIDE 0.9% FLUSH
3.0000 mL | Freq: Two times a day (BID) | INTRAVENOUS | Status: DC
  Administered 2018-06-15 – 2018-06-18 (×8): 3 mL via INTRAVENOUS

## 2018-06-15 MED ORDER — ARGATROBAN 50 MG/50ML IV SOLN
1.1000 ug/kg/min | INTRAVENOUS | Status: DC
  Administered 2018-06-15: 2 ug/kg/min via INTRAVENOUS
  Administered 2018-06-15: 0.6 ug/kg/min via INTRAVENOUS
  Filled 2018-06-15 (×4): qty 50

## 2018-06-15 MED ORDER — SODIUM CHLORIDE 0.9% FLUSH: 3.0000 mL | INTRAVENOUS | Status: DC | PRN

## 2018-06-15 MED ORDER — TRAZODONE HCL 100 MG PO TABS
100.0000 mg | ORAL_TABLET | Freq: Every day | ORAL | Status: DC
  Administered 2018-06-18: 100 mg via ORAL
  Filled 2018-06-15 (×3): qty 1

## 2018-06-15 MED ORDER — FAMOTIDINE IN NACL 20-0.9 MG/50ML-% IV SOLN
20.0000 mg | Freq: Two times a day (BID) | INTRAVENOUS | Status: DC
  Administered 2018-06-15: 20 mg via INTRAVENOUS
  Filled 2018-06-15: qty 50

## 2018-06-15 NOTE — Progress Notes (Signed)
Patient was extubated per Dr. Clovis Fredrickson order. No stridor present. Patient's oxygen saturation on RA is 97%.

## 2018-06-15 NOTE — Progress Notes (Signed)
PHARMACIST - PHYSICIAN COMMUNICATION  CONCERNING: IV to Oral Route Change Policy  RECOMMENDATION: This patient is receiving famotidine by the intravenous route.  Based on criteria approved by the Pharmacy and Therapeutics Committee, the intravenous medication(s) is/are being converted to the equivalent oral dose form(s).   DESCRIPTION: These criteria include:  The patient is eating (either orally or via tube) and/or has been taking other orally administered medications for a least 24 hours  The patient has no evidence of active gastrointestinal bleeding or impaired GI absorption (gastrectomy, short bowel, patient on TNA or NPO).  If you have questions about this conversion, please contact the Pharmacy Department   [x]   [phone] )  [facility]  [place], [facility] 06/15/2018 2:47 PM

## 2018-06-15 NOTE — Progress Notes (Addendum)
ANTICOAGULATION CONSULT NOTE - Initial Consult  Pharmacy Consult for argatroban Indication: suspected HIT  No Known Allergies  Patient Measurements: Height: 6' (182.9 cm) Weight: 149 lb 0.5 oz (67.6 kg) IBW/kg (Calculated) : 77.6  Vital Signs: Temp: 98.6 F (37 C) (02/29 0600) BP: 99/56 (02/29 0600) Pulse Rate: 77 (02/29 0600)  Labs: Recent Labs    06/13/18 1455 06/13/18 1541 06/13/18 2032  06/14/18 0233 06/14/18 0557 06/14/18 0705 06/14/18 0855 06/14/18 1037  06/14/18 1909 06/14/18 2344 06/15/18 0224 06/15/18 0538  HGB 15.9  --   --   --   --   --   --   --   --   --   --   --  10.9*  --   HCT 52.5*  --   --   --   --   --   --   --   --   --   --   --  33.0*  --   PLT 272  --   --   --   --   --   --   --   --   --   --   --  76*  --   APTT  --   --   --   --   --  42*  --   --   --   --   --   --   --  82*  LABPROT 15.2  --   --   --   --   --   --   --   --   --   --   --   --  16.3*  INR 1.2  --   --   --   --   --   --   --   --   --   --   --   --  1.3*  HEPARINUNFRC  --   --   --   --   --   --   --   --  0.58  --  0.86*  --  0.79*  --   CREATININE 2.81*  --   --    < > 1.72*  --  1.53*  --   --    < >  --  1.55* 1.48* 1.34*  CKTOTAL  --  555*  --   --   --   --  1,937*  --   --   --   --   --   --   --   TROPONINI 0.06*  --  0.22*  --  1.14*  --   --  4.78*  --   --   --   --   --   --    < > = values in this interval not displayed.    Estimated Creatinine Clearance: 51.1 mL/min (A) (by C-G formula based on SCr of 1.34 mg/dL (H)).   Medical History: Past Medical History:  Diagnosis Date  . Hypothyroid     Medications:  Scheduled:  . chlorhexidine gluconate (MEDLINE KIT)  15 mL Mouth Rinse BID  . free water  200 mL Per Tube Q4H  . insulin aspart  0-9 Units Subcutaneous Q4H  . insulin glargine  15 Units Subcutaneous QHS  . mouth rinse  15 mL Mouth Rinse 10 times per day  . pantoprazole (PROTONIX) IV  40 mg Intravenous Q24H  . sodium chloride flush   10-40 mL Intracatheter Q12H  .  sodium chloride flush  3 mL Intravenous Q12H    Assessment: Patient was admitted as a respiratory distress and intubated. Patient was placed on heparin drip for concern of STEMI w/ trops rising up to 4.78 currently. Initially, patient's CBC was WNL, after 3 days, this am's CBC: hgb 15.9 >> 10.9; plts 272 >> 76. RN states patient is having some hematuria. Spoke to NP to inform her concerning HIT--NP agrees with plan.  Will repeat aPTT, PT/INR and CMP to ensure patient is not in liver fx since it has been more than 24 hours.  Goal of Therapy:  aPTT 45 - 95 seconds seconds Monitor platelets by anticoagulation protocol: Yes   Plan:  02/29 @ 0730 Child-pugh 8 (grade II encephalophathy prior to intubation, no ascites, tbili 0.7, albumin 2.4, INR 1.3). based on this score will decrease rate to 0.5 mcg/kg/min w/ aPTT check 3 hours post (1030), HIT still pending.  Tobie Lords, PharmD, BCPS Clinical Pharmacist 06/15/2018

## 2018-06-15 NOTE — Progress Notes (Signed)
Patient extubated to room air- Per Dr. Belia Heman- Vitals stable at this time.  Patient was started on Precedex due to agitation.

## 2018-06-15 NOTE — Progress Notes (Signed)
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  No Known Allergies  Patient Measurements: Height: 6' (182.9 cm) Weight: 149 lb 0.5 oz (67.6 kg) IBW/kg (Calculated) : 77.6  Vital Signs: Temp: 98.2 F (36.8 C) (02/29 1800) BP: 88/45 (02/29 1800) Pulse Rate: 65 (02/29 1800)  Labs: Recent Labs    06/13/18 1455 06/13/18 1541 06/13/18 2032  06/14/18 0233  06/14/18 0705 06/14/18 0855 06/14/18 1037  06/14/18 1909  06/15/18 0224 06/15/18 0538 06/15/18 1105 06/15/18 1336 06/15/18 1337 06/15/18 1652  HGB 15.9  --   --   --   --   --   --   --   --   --   --   --  10.9*  --   --   --  11.4*  --   HCT 52.5*  --   --   --   --   --   --   --   --   --   --   --  33.0*  --   --   --  35.0*  --   PLT 272  --   --   --   --   --   --   --   --   --   --   --  76*  --   --   --  81*  --   APTT  --   --   --   --   --    < >  --   --   --   --   --   --   --  82* 50* 46*  --  81*  LABPROT 15.2  --   --   --   --   --   --   --   --   --   --   --   --  16.3*  --   --   --   --   INR 1.2  --   --   --   --   --   --   --   --   --   --   --   --  1.3*  --   --   --   --   HEPARINUNFRC  --   --   --   --   --   --   --   --  0.58  --  0.86*  --  0.79*  --   --   --   --   --   CREATININE 2.81*  --   --    < > 1.72*  --  1.53*  --   --    < >  --    < > 1.48* 1.34* 1.28*  --  1.08  --   CKTOTAL  --  555*  --   --   --   --  1,937*  --   --   --   --   --   --   --   --   --   --   --   TROPONINI 0.06*  --  0.22*  --  1.14*  --   --  4.78*  --   --   --   --   --   --   --   --   --   --    < > = values in this interval not  displayed.    Estimated Creatinine Clearance: 63.5 mL/min (by C-G formula based on SCr of 1.08 mg/dL).   Medical History: Past Medical History:  Diagnosis Date  . Hypothyroid     Medications:  Scheduled:  . chlorhexidine  15 mL Mouth Rinse BID  . famotidine  20 mg Oral BID  . insulin glargine  15 Units Subcutaneous  QHS  . mouth rinse  15 mL Mouth Rinse q12n4p  . potassium chloride  20 mEq Oral Once  . sodium chloride flush  10-40 mL Intracatheter Q12H  . sodium chloride flush  3 mL Intravenous Q12H    Assessment: Patient was admitted as a respiratory distress and intubated. Patient was placed on heparin drip for concern of STEMI w/ trops rising up to 4.78 currently. Initially, patient's CBC was WNL, after 3 days, this am's CBC: hgb 15.9 >> 10.9; plts 272 >> 76. RN states patient is having some hematuria. Spoke to NP to inform her concerning HIT--NP agrees with plan.  Will repeat aPTT, PT/INR and CMP to ensure patient is not in liver fx since it has been more than 24 hours.  Goal of Therapy:  aPTT 50 - 95 seconds seconds Monitor platelets by anticoagulation protocol: Yes   Plan:  Argatroban infusing at 0.36mcg/kg/min - APTT therapeutic @ 81 (therapeutic x 1 @ this rate) - will continue current rate of infusion and recheck confirmatory level in 6 hours  Pharmacy will continue to monitor and adjust per consult.   Albina Billet, PharmD, BCPS Clinical Pharmacist 06/15/2018 8:07 PM

## 2018-06-15 NOTE — Progress Notes (Signed)
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  No Known Allergies  Patient Measurements: Height: 6' (182.9 cm) Weight: 149 lb 0.5 oz (67.6 kg) IBW/kg (Calculated) : 77.6  Vital Signs: Temp: 96.4 F (35.8 C) (02/29 1400) BP: 102/59 (02/29 1400) Pulse Rate: 74 (02/29 1400)  Labs: Recent Labs    06/13/18 1455 06/13/18 1541 06/13/18 2032  06/14/18 0233  06/14/18 0705 06/14/18 0855 06/14/18 1037  06/14/18 1909  06/15/18 0224 06/15/18 0538 06/15/18 1105 06/15/18 1336 06/15/18 1337  HGB 15.9  --   --   --   --   --   --   --   --   --   --   --  10.9*  --   --   --  11.4*  HCT 52.5*  --   --   --   --   --   --   --   --   --   --   --  33.0*  --   --   --  35.0*  PLT 272  --   --   --   --   --   --   --   --   --   --   --  76*  --   --   --  81*  APTT  --   --   --   --   --    < >  --   --   --   --   --   --   --  82* 50* 46*  --   LABPROT 15.2  --   --   --   --   --   --   --   --   --   --   --   --  16.3*  --   --   --   INR 1.2  --   --   --   --   --   --   --   --   --   --   --   --  1.3*  --   --   --   HEPARINUNFRC  --   --   --   --   --   --   --   --  0.58  --  0.86*  --  0.79*  --   --   --   --   CREATININE 2.81*  --   --    < > 1.72*  --  1.53*  --   --    < >  --    < > 1.48* 1.34* 1.28*  --  1.08  CKTOTAL  --  555*  --   --   --   --  1,937*  --   --   --   --   --   --   --   --   --   --   TROPONINI 0.06*  --  0.22*  --  1.14*  --   --  4.78*  --   --   --   --   --   --   --   --   --    < > = values in this interval not displayed.    Estimated Creatinine Clearance: 63.5 mL/min (by C-G formula based on SCr of 1.08 mg/dL).   Medical History: Past Medical History:  Diagnosis Date  .  Hypothyroid     Medications:  Scheduled:  . chlorhexidine  15 mL Mouth Rinse BID  . insulin glargine  15 Units Subcutaneous QHS  . mouth rinse  15 mL Mouth Rinse q12n4p  . sodium chloride flush  10-40 mL Intracatheter  Q12H  . sodium chloride flush  3 mL Intravenous Q12H    Assessment: Patient was admitted as a respiratory distress and intubated. Patient was placed on heparin drip for concern of STEMI w/ trops rising up to 4.78 currently. Initially, patient's CBC was WNL, after 3 days, this am's CBC: hgb 15.9 >> 10.9; plts 272 >> 76. RN states patient is having some hematuria. Spoke to NP to inform her concerning HIT--NP agrees with plan.  Will repeat aPTT, PT/INR and CMP to ensure patient is not in liver fx since it has been more than 24 hours.  Goal of Therapy:  aPTT 50 - 95 seconds seconds Monitor platelets by anticoagulation protocol: Yes   Plan:  Argatroban infusing at 0.29mcg/kg/min. Will increase rate to 0.74mcg/kg/min and obtain next aPTT at 1700.   Pharmacy will continue to monitor and adjust per consult.   MLS 06/15/2018

## 2018-06-15 NOTE — Progress Notes (Signed)
CRITICAL CARE NOTE  CC  follow up respiratory failure  SUBJECTIVE Patient remains critically ill Prognosis is guarded     SIGNIFICANT EVENTS 2.28 admitted to ICU critically ill  REVIEW OF SYSTEMS  PATIENT IS UNABLE TO PROVIDE COMPLETE REVIEW OF SYSTEMS DUE TO SEVERE CRITICAL ILLNESS   PHYSICAL EXAMINATION:  GENERAL:critically ill appearing, +resp distress HEAD: Normocephalic, atraumatic.  EYES: Pupils equal, round, reactive to light.  No scleral icterus.  MOUTH: Moist mucosal membrane. NECK: Supple. No thyromegaly. No nodules. No JVD.  PULMONARY: +rhonchi, +wheezing CARDIOVASCULAR: S1 and S2. Regular rate and rhythm. No murmurs, rubs, or gallops.  GASTROINTESTINAL: Soft, nontender, -distended. No masses. Positive bowel sounds. No hepatosplenomegaly.  MUSCULOSKELETAL: No swelling, clubbing, or edema.  NEUROLOGIC: obtunded, GCS<8 SKIN:intact,warm,dry    CULTURE RESULTS   Recent Results (from the past 240 hour(s))  Urine culture     Status: None   Collection Time: 06/13/18  3:06 PM  Result Value Ref Range Status   Specimen Description   Final    URINE, RANDOM Performed at [facility], [address], [place], [place] [address]    Special Requests   Final    NONE Performed at [facility], [address] [place], [place] [address]    Culture   Final    NO GROWTH Performed at [facility], [place] [address] [place], [place] [address]    Report Status 06/14/2018 FINAL  Final  MRSA PCR Screening     Status: None   Collection Time: 06/13/18  7:58 PM  Result Value Ref Range Status   MRSA by PCR NEGATIVE NEGATIVE Final    Comment:        The GeneXpert MRSA Assay (FDA approved for NASAL specimens only), is one component of a comprehensive MRSA colonization surveillance program. It is not intended to diagnose MRSA infection nor to guide or monitor treatment for MRSA infections. Performed at [facility], [address]  [place], [place] [address]   CULTURE, BLOOD (ROUTINE X 2) w Reflex to ID Panel     Status: None (Preliminary result)   Collection Time: 06/13/18  8:43 PM  Result Value Ref Range Status   Specimen Description BLOOD BLOOD RIGHT HAND  Final   Special Requests   Final    BOTTLES DRAWN AEROBIC AND ANAEROBIC Blood Culture adequate volume   Culture   Final    NO GROWTH 2 DAYS Performed at [facility], [address], [place], [place] [address]    Report Status PENDING  Incomplete  CULTURE, BLOOD (ROUTINE X 2) w Reflex to ID Panel     Status: None (Preliminary result)   Collection Time: 06/13/18 10:09 PM  Result Value Ref Range Status   Specimen Description BLOOD BLOOD RIGHT FOREARM  Final   Special Requests   Final    BOTTLES DRAWN AEROBIC AND ANAEROBIC Blood Culture adequate volume   Culture   Final    NO GROWTH 2 DAYS Performed at [facility], [address], [place],  [address]    Report Status PENDING  Incomplete  Culture, respiratory (non-expectorated)     Status: None (Preliminary result)   Collection Time: 06/14/18  7:45 AM  Result Value Ref Range Status   Specimen Description   Final    TRACHEAL ASPIRATE Performed at [facility], [address], [place],  [address]    Special Requests   Final    Normal Performed at [facility], [address] [place], [place] [address]    Gram Stain   Final    RARE WBC PRESENT, PREDOMINANTLY PMN NO ORGANISMS SEEN Performed at [facility], [place] [address], [place], [place] [address]    Culture PENDING  Incomplete   Report Status PENDING  Incomplete          IMAGING    No results found.    Indwelling Urinary Catheter continued, requirement due to   Reason to continue Indwelling Urinary Catheter for strict Intake/Output monitoring for hemodynamic instability         Ventilator continued, requirement due to, resp failure    Ventilator Sedation RASS 0 to -2      ASSESSMENT AND PLAN SYNOPSIS 68 year old male with past medical history of diabetes, was found unresponsive at home, required intubation in the ED for airway protection and impending respiratory failure. Patient with severe DKA and diabetic coma, severe metabolic acidosis, and AKI.  Patient meets SIRS criteria, concern for possible pancreatitis and possible aspiration pneumonia  Severe Hypoxic and Hypercapnic Respiratory Failure -continue Full MV support -continue Bronchodilator Therapy -Wean Fio2 and PEEP as tolerated -will perform SAT/SBT when respiratory parameters are met   Renal Failure-most likely due to ATN -follow chem 7 -follow UO -continue Foley Catheter-assess need daily   NEUROLOGY - intubated and sedated - minimal sedation to achieve a RASS goal: -1 Wake up assessment pending   Septic shock-aspiration pneumonia -use vasopressors to keep MAP>65 -follow ABG and LA -follow up cultures -emperic ABX   CARDIAC ICU monitoring  ID -continue IV abx as prescibed -follow up cultures  GI/Nutrition-pancreatitis GI PROPHYLAXIS as indicated DIET-->TF's as tolerated Constipation protocol as indicated  ENDO-severe DKA and pancreatitis - ICU hypoglycemic\Hyperglycemia protocol -check FSBS per protocol   ELECTROLYTES -follow labs as needed -replace as needed -pharmacy consultation and following   DVT/GI PRX ordered TRANSFUSIONS AS NEEDED MONITOR FSBS ASSESS the need for LABS as needed   Critical Care Time devoted to patient care services described in this note is 31 minutes.   Overall, patient is critically ill, prognosis is guarded.  Patient with Multiorgan failure and at high risk for cardiac arrest and death.    Corrin Parker, M.D.  [facility]  Medical Director [place] Director [facility] Cardio-Pulmonary Department

## 2018-06-15 NOTE — Progress Notes (Signed)
[facility]  SUBJECTIVE: Patient laying in bed, extubated   Vitals:   06/15/18 0741 06/15/18 0800 06/15/18 0900 06/15/18 1000  BP:  (!) 142/77 113/68 (!) 79/57  Pulse:  100 93 62  Resp:  (!) 22 16 13   Temp:  98.6 F (37 C) 98.8 F (37.1 C) 98.6 F (37 C)  TempSrc:      SpO2: 97% 97% 99% 98%  Weight:      Height:         Intake/Output Summary (Last 24 hours) at 06/15/2018 1117 Last data filed at 06/15/2018 1000 Gross per 24 hour  Intake 5503.65 ml  Output 1350 ml  Net 4153.65 ml      PHYSICAL EXAM  General: Well developed, well nourished, in no acute distress HEENT:  Normocephalic and atramatic Neck:  No JVD.  Lungs: Clear bilaterally to auscultation and percussion. Heart: HRRR . Normal S1 and S2 without gallops or murmurs.  Abdomen: Bowel sounds are positive, abdomen soft and non-tender  Msk:  Back normal, normal gait. Normal strength and tone for age. Extremities: No clubbing, cyanosis or edema.   Neuro: Alert and oriented X 3. Psych:  Good affect, responds appropriately   LABS: Basic Metabolic Panel: Recent Labs    06/13/18 2211  06/14/18 1248  06/15/18 0224 06/15/18 0538  NA 158*   < > 161*   < > 155* 152*  K 3.5   < > 3.8   < > 4.2 3.9  CL 121*   < > 127*   < > 126* 123*  CO2 18*   < > 27   < > 25 24  GLUCOSE 746*   < > 123*   < > 333* 320*  BUN 41*   < > 32*   < > 29* 29*  CREATININE 2.49*   < > 1.65*   < > 1.48* 1.34*  CALCIUM 8.2*   < > 7.6*   < > 7.0* 7.1*  MG 2.9*  --  2.2  --   --   --   PHOS  --    < > 1.9*  --  2.6  --    < > = values in this interval not displayed.   Liver Function Tests: Recent Labs    06/14/18 0705 06/15/18 0538  AST 40 66*  ALT 20 24  ALKPHOS 51 44  BILITOT 0.6 0.7  PROT 5.1* 4.8*  ALBUMIN 2.8* 2.4*   Recent Labs    06/13/18 1455 06/13/18 2032 06/14/18 0705  LIPASE 326*  --   --   AMYLASE  --  1,300* 743*   CBC: Recent Labs    06/13/18 1455 06/15/18 0224  WBC 27.5* 10.3  NEUTROABS 22.9*  --    HGB 15.9 10.9*  HCT 52.5* 33.0*  MCV 104.2* 96.8  PLT 272 76*   Cardiac Enzymes: Recent Labs    06/13/18 1541 06/13/18 2032 06/14/18 0233 06/14/18 0705 06/14/18 0855  CKTOTAL 555*  --   --  1,937*  --   TROPONINI  --  0.22* 1.14*  --  4.78*   BNP: Invalid input(s): POCBNP D-Dimer: No results for input(s): DDIMER in the last 72 hours. Hemoglobin A1C: No results for input(s): HGBA1C in the last 72 hours. Fasting Lipid Panel: Recent Labs    06/14/18 0705  TRIG 213*   Thyroid Function Tests: Recent Labs    06/13/18 2032  TSH 0.100*  T4TOTAL 4.9   Anemia Panel: No results for input(s): VITAMINB12, FOLATE,  FERRITIN, TIBC, IRON, RETICCTPCT in the last 72 hours.  Dg Abd 1 View  Result Date: 06/14/2018 CLINICAL DATA:  Orogastric tube placement. EXAM: ABDOMEN - 1 VIEW COMPARISON:  None. FINDINGS: Nasogastric tube tip projects in gastric cardia, side-port at GE junction region. Single loop of gas distended colon. Lung bases are clear. Included soft tissue planes and osseous structures are non suspicious. IMPRESSION: Nasogastric tube tip projects in gastric cardia. Electronically Signed   By: Awilda Metro M.D.   On: 06/14/2018 06:20   Ct Head Wo Contrast  Result Date: 06/13/2018 CLINICAL DATA:  Found unresponsive this morning. EXAM: CT HEAD WITHOUT CONTRAST TECHNIQUE: Contiguous axial images were obtained from the base of the skull through the vertex without intravenous contrast. COMPARISON:  None. FINDINGS: Brain: Mild chronic small vessel ischemic changes within the deep periventricular white matter. No mass, hemorrhage, edema or other evidence of acute parenchymal abnormality. No extra-axial hemorrhage. Vascular: Chronic calcified atherosclerotic changes of the large vessels at the skull base. No unexpected hyperdense vessel. Skull: Normal. Negative for fracture or focal lesion. Sinuses/Orbits: No acute finding. Other: None. IMPRESSION: No acute findings. No intracranial  mass, hemorrhage or edema. Electronically Signed   By: Bary Richard M.D.   On: 06/13/2018 16:08   US Abdomen Complete  Result Date: 06/13/2018 CLINICAL DATA:  Elevated lipase EXAM: ABDOMEN ULTRASOUND COMPLETE COMPARISON:  None. FINDINGS: Gallbladder: No gallstones or wall thickening visualized. No sonographic Murphy sign noted by sonographer. Common bile duct: Diameter: Normal caliber, 4 mm Liver: Increased echotexture compatible with fatty infiltration. Hypoechoic area within the right hepatic lobe likely reflects focal fatty sparing. No focal abnormality or biliary ductal dilatation. Portal vein is patent on color Doppler imaging with normal direction of blood flow towards the liver. IVC: No abnormality visualized. Pancreas: Visualized portion unremarkable. Spleen: Size and appearance within normal limits. Right Kidney: Length: 11.8 cm. Echogenicity within normal limits. No mass or hydronephrosis visualized. Left Kidney: Length: 11.0 cm. Echogenicity within normal limits. No mass or hydronephrosis visualized. Abdominal aorta: No aneurysm visualized. Other findings: None. IMPRESSION: Fatty infiltration of the liver. No acute findings. Electronically Signed   By: Charlett Nose M.D.   On: 06/13/2018 21:55   Dg Chest Port 1 View  Result Date: 06/14/2018 CLINICAL DATA:  Central line placement. EXAM: PORTABLE CHEST 1 VIEW COMPARISON:  Chest radiograph June 13, 2018 FINDINGS: RIGHT internal jugular central venous catheter distal tip projects in mid superior vena cava. Endotracheal tube tip projects 5.3 cm above the carina. Cardiomediastinal silhouette is normal. Calcified aortic arch. No pleural effusion or focal consolidation. Minimal biapical pleural thickening. No pneumothorax. Soft tissue planes and included osseous structures are unchanged. IMPRESSION: 1. RIGHT IJ central venous catheter distal tip projects in mid superior vena cava. No pneumothorax. Endotracheal tube tip projects 5.3 cm above the  carina. 2. No acute cardiopulmonary process. 3.  Aortic Atherosclerosis (ICD10-I70.0). Electronically Signed   By: Awilda Metro M.D.   On: 06/14/2018 02:47   Dg Chest Portable 1 View  Result Date: 06/13/2018 CLINICAL DATA:  Hypoxia and unresponsive EXAM: PORTABLE CHEST 1 VIEW COMPARISON:  None. FINDINGS: Endotracheal tube tip is 4.1 cm above the carina. No pneumothorax. There is no edema or consolidation. Heart size and pulmonary vascularity are normal. No adenopathy. There is aortic atherosclerosis. No bone lesions. IMPRESSION: Endotracheal tube as described without pneumothorax. No edema or consolidation. Heart size normal. Aortic Atherosclerosis (ICD10-I70.0). Electronically Signed   By: Bretta Bang III M.D.   On: 06/13/2018 15:50  Echo LVEF 40 to 45%  TELEMETRY: NS rhythm:  ASSESSMENT AND PLAN:  Active Problems:   Acute respiratory failure (HCC)   Protein-calorie malnutrition, severe   Pressure injury of skin    1.  Elevated troponin, 2.06, 0.22, 1.14, 4.78 in the setting of KA, severe metabolic acidosis, hemolysis, duration pneumonia, and acute hypoxic respiratory failure.  ECG nondiagnostic, 2D echocardiogram reveals mild reduced left ventricular function. 2.  Thrombocytopenia, possible HIT, now on argatroban 3.  Respiratory failure, status post extubation 4.  DKA/diabetic coma  Recommendations  1.  Agree with overall current therapy 2.  Continue argatroban 24 to 48 hours, other recommendations including cardiac catheterization pending patient's clinical course   Marcina Millard, MD, PhD, [facility] 06/15/2018 11:17 AM

## 2018-06-15 NOTE — Progress Notes (Signed)
Pharmacy Electrolyte Monitoring Consult:  Pharmacy consulted to assist in monitoring and replacing electrolytes in this 68 y.o. male admitted on 06/13/2018 with respiratory distress, was found unresponsive w/ BG of 1150 and was found to be in DKA although no h/o of diabetes. Patient is s/p intubation d/t AMS from diabetic encephalopathy w/ elevated lipase, fatty liver on [place].  Labs:  Sodium (mmol/L)  Date Value  06/14/2018 154 (H)   Potassium (mmol/L)  Date Value  06/14/2018 4.1   Magnesium (mg/dL)  Date Value  51/05/5850 2.2   Phosphorus (mg/dL)  Date Value  77/82/4235 1.9 (L)   Calcium (mg/dL)  Date Value  36/14/4315 7.0 (L)   Albumin (g/dL)  Date Value  40/11/6759 2.8 (L)    Assessment/Plan: 02/28 @ 2344 Na 154, K 4.1 WNL, BG 315 off insulin drip. Spoke to NP to switch fluids from D5+1/2NS+KCI to LR since patient is still slightly hypernatremic, and eukalemic, especially since insulin drip now off, K may start to increase. NP notified and agrees with switch to LR. Will continue to monitor electrolytes, Phos pending w/ am labs.  Thomasene Ripple, PharmD, BCPS Clinical Pharmacist 06/15/2018

## 2018-06-15 NOTE — Progress Notes (Addendum)
ANTICOAGULATION CONSULT NOTE - Initial Consult  Pharmacy Consult for heparin drip Indication: chest pain/ACS  No Known Allergies  Patient Measurements: Height: 6' (182.9 cm) Weight: 149 lb 0.5 oz (67.6 kg) IBW/kg (Calculated) : 77.6 Heparin Dosing Weight: 68 kg  Vital Signs: Temp: 98.6 F (37 C) (02/29 0200) BP: 93/63 (02/29 0200) Pulse Rate: 82 (02/29 0200)  Labs: Recent Labs    06/13/18 1455 06/13/18 1541 06/13/18 2032  06/14/18 0233 06/14/18 0557 06/14/18 0705 06/14/18 0855 06/14/18 1037  06/14/18 1426 06/14/18 1909 06/14/18 2344 06/15/18 0224  HGB 15.9  --   --   --   --   --   --   --   --   --   --   --   --  10.9*  HCT 52.5*  --   --   --   --   --   --   --   --   --   --   --   --  33.0*  PLT 272  --   --   --   --   --   --   --   --   --   --   --   --  76*  APTT  --   --   --   --   --  42*  --   --   --   --   --   --   --   --   LABPROT 15.2  --   --   --   --   --   --   --   --   --   --   --   --   --   INR 1.2  --   --   --   --   --   --   --   --   --   --   --   --   --   HEPARINUNFRC  --   --   --   --   --   --   --   --  0.58  --   --  0.86*  --  0.79*  CREATININE 2.81*  --   --    < > 1.72*  --  1.53*  --   --    < > 1.59*  --  1.55* 1.48*  CKTOTAL  --  555*  --   --   --   --  1,937*  --   --   --   --   --   --   --   TROPONINI 0.06*  --  0.22*  --  1.14*  --   --  4.78*  --   --   --   --   --   --    < > = values in this interval not displayed.    Estimated Creatinine Clearance: 46.3 mL/min (A) (by C-G formula based on SCr of 1.48 mg/dL (H)).   Medical History: Past Medical History:  Diagnosis Date  . Hypothyroid     Medications:  Scheduled:  . chlorhexidine gluconate (MEDLINE KIT)  15 mL Mouth Rinse BID  . free water  200 mL Per Tube Q4H  . insulin aspart  0-9 Units Subcutaneous Q4H  . insulin glargine  15 Units Subcutaneous QHS  . mouth rinse  15 mL Mouth Rinse 10 times per day  . pantoprazole (PROTONIX) IV  40 mg  Intravenous Q24H  . sodium chloride flush  10-40 mL Intracatheter Q12H    Assessment: Patient admitted s/t respiratory dx s/p intubation, and now patient's trops rising 0.06 >> 0.22 >> 1.14, EKG pending. No PTA anticoagulation, being started on a heparin drip for ACS.  2/28 10:37 HL 0.58  Goal of Therapy:  Heparin level 0.3-0.7 units/ml Monitor platelets by anticoagulation protocol: Yes   Plan:  02/29 @ 0200 HL 0.79 supratherapeutic. Will decrease rate to 750 units/hr and recheck HL @ 1300, per RN patient having some hematuria, CBC has drastically dropped from 3 days ago, plts went from 272 >> 76, potentially HIT? Will mention to NP figure out future plan.  Tobie Lords, PharmD, BCPS Clinical Pharmacist 06/15/2018

## 2018-06-15 NOTE — Progress Notes (Addendum)
[facility] - [place] at [facility]     PATIENT NAME: Joseph Luna    MR#:  [id number]  DATE OF BIRTH:  Feb 16, 1951  SUBJECTIVE:   Patient extubated this morning and doing well from a respiratory standpoint.  Became agitated requiring some Precedex.  Per daughter at bedside, patient has been resting comfortably since then.  REVIEW OF SYSTEMS:    Review of Systems  Unable to perform ROS: Intubated    Nutrition: NPO Tolerating Diet: No as pt. Is intubated Tolerating PT: Await Eval.   DRUG ALLERGIES:  No Known Allergies  VITALS:  Blood pressure (!) 102/59, pulse 74, temperature (!) 96.4 F (35.8 C), resp. rate 18, height 6' (1.829 m), weight 67.6 kg, SpO2 96 %.  PHYSICAL EXAMINATION:   Physical Exam GENERAL:  68 y.o.-year-old patient lying in bed, sleeping comfortably. EYES: Pupils equal, round, reactive to light. No scleral icterus. HEENT: Head atraumatic, normocephalic. ET and OG tubes in place.  NECK:  Supple, no jugular venous distention. No thyroid enlargement, no tenderness.  LUNGS: Lungs CTAB,  no wheezing, rales, rhonchi. No use of accessory muscles of respiration.  CARDIOVASCULAR: RRR, S1, S2 normal. No murmurs, rubs, or gallops.  ABDOMEN: Soft, nontender, nondistended. Bowel sounds present. No organomegaly or mass.  EXTREMITIES: No cyanosis, clubbing or edema b/l.    NEUROLOGIC: Sedated  PSYCHIATRIC: Sedated  SKIN: No obvious rash, lesion, or ulcer.   LABORATORY PANEL:   CBC Recent Labs  Lab 06/15/18 1337  WBC 10.6*  HGB 11.4*  HCT 35.0*  PLT 81*   ------------------------------------------------------------------------------------------------------------------  Chemistries  Recent Labs  Lab 06/14/18 1248  06/15/18 0538  06/15/18 1337  NA 161*   < > 152*   < > 153*  K 3.8   < > 3.9   < > 3.9  CL 127*   < > 123*   < > 123*  CO2 27   < > 24   < > 24  GLUCOSE 123*   < > 320*   < > 244*  BUN 32*   < > 29*   < > 25*  CREATININE  1.65*   < > 1.34*   < > 1.08  CALCIUM 7.6*   < > 7.1*   < > 7.4*  MG 2.2  --   --   --   --   AST  --   --  66*  --   --   ALT  --   --  24  --   --   ALKPHOS  --   --  44  --   --   BILITOT  --   --  0.7  --   --    < > = values in this interval not displayed.   ------------------------------------------------------------------------------------------------------------------  Cardiac Enzymes Recent Labs  Lab 06/14/18 0855  TROPONINI 4.78*   ------------------------------------------------------------------------------------------------------------------  RADIOLOGY:  Dg Abd 1 View  Result Date: 06/14/2018 CLINICAL DATA:  Orogastric tube placement. EXAM: ABDOMEN - 1 VIEW COMPARISON:  None. FINDINGS: Nasogastric tube tip projects in gastric cardia, side-port at GE junction region. Single loop of gas distended colon. Lung bases are clear. Included soft tissue planes and osseous structures are non suspicious. IMPRESSION: Nasogastric tube tip projects in gastric cardia. Electronically Signed   By: Awilda Metro M.D.   On: 06/14/2018 06:20   Ct Head Wo Contrast  Result Date: 06/13/2018 CLINICAL DATA:  Found unresponsive this morning. EXAM: CT HEAD WITHOUT CONTRAST TECHNIQUE: Contiguous axial images  were obtained from the base of the skull through the vertex without intravenous contrast. COMPARISON:  None. FINDINGS: Brain: Mild chronic small vessel ischemic changes within the deep periventricular white matter. No mass, hemorrhage, edema or other evidence of acute parenchymal abnormality. No extra-axial hemorrhage. Vascular: Chronic calcified atherosclerotic changes of the large vessels at the skull base. No unexpected hyperdense vessel. Skull: Normal. Negative for fracture or focal lesion. Sinuses/Orbits: No acute finding. Other: None. IMPRESSION: No acute findings. No intracranial mass, hemorrhage or edema. Electronically Signed   By: Bary Richard M.D.   On: 06/13/2018 16:08   US Abdomen  Complete  Result Date: 06/13/2018 CLINICAL DATA:  Elevated lipase EXAM: ABDOMEN ULTRASOUND COMPLETE COMPARISON:  None. FINDINGS: Gallbladder: No gallstones or wall thickening visualized. No sonographic Murphy sign noted by sonographer. Common bile duct: Diameter: Normal caliber, 4 mm Liver: Increased echotexture compatible with fatty infiltration. Hypoechoic area within the right hepatic lobe likely reflects focal fatty sparing. No focal abnormality or biliary ductal dilatation. Portal vein is patent on color Doppler imaging with normal direction of blood flow towards the liver. IVC: No abnormality visualized. Pancreas: Visualized portion unremarkable. Spleen: Size and appearance within normal limits. Right Kidney: Length: 11.8 cm. Echogenicity within normal limits. No mass or hydronephrosis visualized. Left Kidney: Length: 11.0 cm. Echogenicity within normal limits. No mass or hydronephrosis visualized. Abdominal aorta: No aneurysm visualized. Other findings: None. IMPRESSION: Fatty infiltration of the liver. No acute findings. Electronically Signed   By: Charlett Nose M.D.   On: 06/13/2018 21:55   Dg Chest Port 1 View  Result Date: 06/14/2018 CLINICAL DATA:  Central line placement. EXAM: PORTABLE CHEST 1 VIEW COMPARISON:  Chest radiograph June 13, 2018 FINDINGS: RIGHT internal jugular central venous catheter distal tip projects in mid superior vena cava. Endotracheal tube tip projects 5.3 cm above the carina. Cardiomediastinal silhouette is normal. Calcified aortic arch. No pleural effusion or focal consolidation. Minimal biapical pleural thickening. No pneumothorax. Soft tissue planes and included osseous structures are unchanged. IMPRESSION: 1. RIGHT IJ central venous catheter distal tip projects in mid superior vena cava. No pneumothorax. Endotracheal tube tip projects 5.3 cm above the carina. 2. No acute cardiopulmonary process. 3.  Aortic Atherosclerosis (ICD10-I70.0). Electronically Signed   By:  Awilda Metro M.D.   On: 06/14/2018 02:47   Dg Chest Portable 1 View  Result Date: 06/13/2018 CLINICAL DATA:  Hypoxia and unresponsive EXAM: PORTABLE CHEST 1 VIEW COMPARISON:  None. FINDINGS: Endotracheal tube tip is 4.1 cm above the carina. No pneumothorax. There is no edema or consolidation. Heart size and pulmonary vascularity are normal. No adenopathy. There is aortic atherosclerosis. No bone lesions. IMPRESSION: Endotracheal tube as described without pneumothorax. No edema or consolidation. Heart size normal. Aortic Atherosclerosis (ICD10-I70.0). Electronically Signed   By: Bretta Bang III M.D.   On: 06/13/2018 15:50     ASSESSMENT AND PLAN:   68 yo male w/ hx of Hypothyroidism who was found unresponsive and noted to be encephalopathic.  1.  Altered mental status/encephalopathy-this is metabolic in nature secondary to acute DKA and severe hyperglycemia.  Will follow mental status when patient is no longer sedated.  Extubated this morning.  Currently on Precedex this morning for agitation.  2.  Acute DKA- resolved. Patient apparently was not on diabetic meds prior to coming to the hospital.  Blood sugars improving, although did have a blood sugar to 503 at lunch. -Off insulin drip -Continue Lantus and SSI  3. NSTEMI- patient has ruled in by cardiac markers,  troponin has trended up as high as 4. - Cardiology consulted- continue argatroban for 24 to 48 hours, will consider further work-up depending on how patient does.  4. Congestive heart failure with mid-range EF- ECHO with EF 40-45%. -Unable to start beta blocker/ACE due to hypotension  5.  Acute kidney injury- resolved with IV fluids  6.  Hypernatremia- improving - Continue free water flushes  7.  Leukocytosis- likely reactive in the setting of DKA -On Unasyn for suspected aspiration pneumonia  8.  Hypotension-likely secondary to hypovolemic shock from DKA and dehydration. -Continue Levophed  9.  GERD-continue  Protonix.  All the records are reviewed and case discussed with Care Management/Social Worker. Management plans discussed with the patient, family and they are in agreement.  CODE STATUS: Full code  DVT Prophylaxis: Heparin gtt  TOTAL TIME TAKING CARE OF THIS PATIENT: 35 minutes.   POSSIBLE D/C unclear, DEPENDING ON CLINICAL CONDITION and progress.   Jinny Blossom Donal Lynam M.D on 06/15/2018 at 2:27 PM  Between 7am to 6pm - Pager [phone]  After 6pm go to [email or web address] - Social research officer, government  [facility] [place]  Office  [phone]  CC: Primary care physician; Patient, No Pcp Per

## 2018-06-15 NOTE — Progress Notes (Signed)
Pharmacy Electrolyte Monitoring Consult:  Pharmacy consulted to assist in monitoring and replacing electrolytes in this 68 y.o. male admitted on 06/13/2018 with Respiratory Distress   Labs:  Sodium (mmol/L)  Date Value  06/15/2018 153 (H)   Potassium (mmol/L)  Date Value  06/15/2018 3.9   Magnesium (mg/dL)  Date Value  95/62/1308 2.2   Phosphorus (mg/dL)  Date Value  65/78/4696 2.6   Calcium (mg/dL)  Date Value  29/52/8413 7.4 (L)   Albumin (g/dL)  Date Value  24/40/1027 2.4 (L)   Corrected Calcium: 8.3   Assessment/Plan: Patient transitioned to D5 at 18mL/hr due to hypernatremia. Patient placed back on Phase 2 ICU Hyperglycemia protocol.  Will order potassium PO x 1.   Will follow up electrolytes with am labs.   Pharmacy will continue to monitor and adjust per consult.   Shyvonne Chastang L 06/15/2018 2:41 PM

## 2018-06-15 NOTE — Progress Notes (Addendum)
ANTICOAGULATION CONSULT NOTE - Initial Consult  Pharmacy Consult for argatroban Indication: suspected HIT  No Known Allergies  Patient Measurements: Height: 6' (182.9 cm) Weight: 149 lb 0.5 oz (67.6 kg) IBW/kg (Calculated) : 77.6  Vital Signs: Temp: 98.2 F (36.8 C) (02/29 0500) BP: 105/58 (02/29 0500) Pulse Rate: 82 (02/29 0500)  Labs: Recent Labs    06/13/18 1455 06/13/18 1541 06/13/18 2032  06/14/18 0233 06/14/18 0557 06/14/18 0705 06/14/18 0855 06/14/18 1037  06/14/18 1426 06/14/18 1909 06/14/18 2344 06/15/18 0224  HGB 15.9  --   --   --   --   --   --   --   --   --   --   --   --  10.9*  HCT 52.5*  --   --   --   --   --   --   --   --   --   --   --   --  33.0*  PLT 272  --   --   --   --   --   --   --   --   --   --   --   --  76*  APTT  --   --   --   --   --  42*  --   --   --   --   --   --   --   --   LABPROT 15.2  --   --   --   --   --   --   --   --   --   --   --   --   --   INR 1.2  --   --   --   --   --   --   --   --   --   --   --   --   --   HEPARINUNFRC  --   --   --   --   --   --   --   --  0.58  --   --  0.86*  --  0.79*  CREATININE 2.81*  --   --    < > 1.72*  --  1.53*  --   --    < > 1.59*  --  1.55* 1.48*  CKTOTAL  --  555*  --   --   --   --  1,937*  --   --   --   --   --   --   --   TROPONINI 0.06*  --  0.22*  --  1.14*  --   --  4.78*  --   --   --   --   --   --    < > = values in this interval not displayed.    Estimated Creatinine Clearance: 46.3 mL/min (A) (by C-G formula based on SCr of 1.48 mg/dL (H)).   Medical History: Past Medical History:  Diagnosis Date  . Hypothyroid     Medications:  Scheduled:  . chlorhexidine gluconate (MEDLINE KIT)  15 mL Mouth Rinse BID  . free water  200 mL Per Tube Q4H  . insulin aspart  0-9 Units Subcutaneous Q4H  . insulin glargine  15 Units Subcutaneous QHS  . mouth rinse  15 mL Mouth Rinse 10 times per day  . pantoprazole (PROTONIX) IV  40 mg Intravenous Q24H  . sodium chloride  flush  10-40 mL Intracatheter Q12H  . sodium chloride flush  3 mL Intravenous Q12H    Assessment: Patient was admitted as a respiratory distress and intubated. Patient was placed on heparin drip for concern of STEMI w/ trops rising up to 4.78 currently. Initially, patient's CBC was WNL, after 3 days, this am's CBC: hgb 15.9 >> 10.9; plts 272 >> 76. RN states patient is having some hematuria. Spoke to NP to inform her concerning HIT--NP agrees with plan.  Will repeat aPTT, PT/INR and CMP to ensure patient is not in liver fx since it has been more than 24 hours.  Goal of Therapy:  aPTT 45 - 95 seconds seconds Monitor platelets by anticoagulation protocol: Yes   Plan:  Heparin orders and consult have been discontinued. Will start argatroban at a rate of 2 mcg/kg/min pending that CMP does not show patient to be in Child-pugh B/C. Will check another post-therapy aPTT 3 hours post start of infusion. Will continue to monitor CBC and adjust per aPTTs.  Tobie Lords, PharmD, BCPS Clinical Pharmacist 06/15/2018

## 2018-06-16 LAB — CBC
HCT: 34.9 % — ABNORMAL LOW (ref 39.0–52.0)
Hemoglobin: 11.2 g/dL — ABNORMAL LOW (ref 13.0–17.0)
MCH: 31.7 pg (ref 26.0–34.0)
MCHC: 32.1 g/dL (ref 30.0–36.0)
MCV: 98.9 fL (ref 80.0–100.0)
Platelets: 67 10*3/uL — ABNORMAL LOW (ref 150–400)
RBC: 3.53 MIL/uL — ABNORMAL LOW (ref 4.22–5.81)
RDW: 13.9 % (ref 11.5–15.5)
WBC: 6.8 10*3/uL (ref 4.0–10.5)
nRBC: 0 % (ref 0.0–0.2)

## 2018-06-16 LAB — PHOSPHORUS: PHOSPHORUS: 2.3 mg/dL — AB (ref 2.5–4.6)

## 2018-06-16 LAB — GLUCOSE, CAPILLARY
GLUCOSE-CAPILLARY: 245 mg/dL — AB (ref 70–99)
GLUCOSE-CAPILLARY: 236 mg/dL — AB (ref 70–99)
Glucose-Capillary: 122 mg/dL — ABNORMAL HIGH (ref 70–99)
Glucose-Capillary: 153 mg/dL — ABNORMAL HIGH (ref 70–99)
Glucose-Capillary: 198 mg/dL — ABNORMAL HIGH (ref 70–99)
Glucose-Capillary: 214 mg/dL — ABNORMAL HIGH (ref 70–99)
Glucose-Capillary: 235 mg/dL — ABNORMAL HIGH (ref 70–99)
Glucose-Capillary: 242 mg/dL — ABNORMAL HIGH (ref 70–99)
Glucose-Capillary: 245 mg/dL — ABNORMAL HIGH (ref 70–99)
Glucose-Capillary: 263 mg/dL — ABNORMAL HIGH (ref 70–99)
Glucose-Capillary: 277 mg/dL — ABNORMAL HIGH (ref 70–99)
Glucose-Capillary: 282 mg/dL — ABNORMAL HIGH (ref 70–99)
Glucose-Capillary: 322 mg/dL — ABNORMAL HIGH (ref 70–99)
Glucose-Capillary: 408 mg/dL — ABNORMAL HIGH (ref 70–99)
Glucose-Capillary: 74 mg/dL (ref 70–99)
Glucose-Capillary: 90 mg/dL (ref 70–99)
Glucose-Capillary: 99 mg/dL (ref 70–99)

## 2018-06-16 LAB — BASIC METABOLIC PANEL
Anion gap: 8 (ref 5–15)
BUN: 22 mg/dL (ref 8–23)
CO2: 21 mmol/L — ABNORMAL LOW (ref 22–32)
CREATININE: 1.17 mg/dL (ref 0.61–1.24)
Calcium: 7.5 mg/dL — ABNORMAL LOW (ref 8.9–10.3)
Chloride: 122 mmol/L — ABNORMAL HIGH (ref 98–111)
GFR calc non Af Amer: >60 mL/min (ref >60)
Glucose, Bld: 280 mg/dL — ABNORMAL HIGH (ref 70–99)
Potassium: 4.1 mmol/L (ref 3.5–5.1)
Sodium: 151 mmol/L — ABNORMAL HIGH (ref 135–145)

## 2018-06-16 LAB — CULTURE, RESPIRATORY W GRAM STAIN: Special Requests: NORMAL

## 2018-06-16 LAB — APTT
aPTT: 34 seconds (ref 24–36)
aPTT: 32 seconds (ref 24–36)

## 2018-06-16 LAB — MAGNESIUM: Magnesium: 2 mg/dL (ref 1.7–2.4)

## 2018-06-16 LAB — CULTURE, RESPIRATORY

## 2018-06-16 MED ORDER — INSULIN ASPART 100 UNIT/ML ~~LOC~~ SOLN
0.0000 [IU] | SUBCUTANEOUS | Status: DC
  Administered 2018-06-16: 3 [IU] via SUBCUTANEOUS
  Administered 2018-06-16 – 2018-06-17 (×2): 5 [IU] via SUBCUTANEOUS
  Administered 2018-06-17: 11 [IU] via SUBCUTANEOUS
  Administered 2018-06-17: 8 [IU] via SUBCUTANEOUS
  Administered 2018-06-17: 2 [IU] via SUBCUTANEOUS
  Filled 2018-06-16 (×6): qty 1

## 2018-06-16 MED ORDER — ASPIRIN EC 81 MG PO TBEC
81.0000 mg | DELAYED_RELEASE_TABLET | Freq: Every day | ORAL | Status: DC
  Administered 2018-06-16 – 2018-06-19 (×4): 81 mg via ORAL
  Filled 2018-06-16 (×4): qty 1

## 2018-06-16 MED ORDER — ACETAMINOPHEN 325 MG PO TABS: 650.0000 mg | ORAL_TABLET | Freq: Four times a day (QID) | ORAL | Status: DC | PRN

## 2018-06-16 MED ORDER — INSULIN GLARGINE 100 UNIT/ML ~~LOC~~ SOLN
20.0000 [IU] | Freq: Every day | SUBCUTANEOUS | Status: DC
  Administered 2018-06-16 – 2018-06-17 (×2): 20 [IU] via SUBCUTANEOUS
  Filled 2018-06-16 (×2): qty 0.2

## 2018-06-16 MED ORDER — METOPROLOL SUCCINATE ER 50 MG PO TB24
50.0000 mg | ORAL_TABLET | Freq: Every day | ORAL | Status: DC
  Administered 2018-06-17 – 2018-06-18 (×2): 50 mg via ORAL
  Filled 2018-06-16 (×3): qty 1

## 2018-06-16 NOTE — Progress Notes (Signed)
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  No Known Allergies  Patient Measurements: Height: 6' (182.9 cm) Weight: 149 lb 0.5 oz (67.6 kg) IBW/kg (Calculated) : 77.6  Vital Signs: Temp: 98.2 F (36.8 C) (02/29 1800) BP: 88/45 (02/29 1800) Pulse Rate: 65 (02/29 1800)  Labs: Recent Labs    06/13/18 1455 06/13/18 1541 06/13/18 2032  06/14/18 0233  06/14/18 0705 06/14/18 0855 06/14/18 1037  06/14/18 1909  06/15/18 0224 06/15/18 0538 06/15/18 1105 06/15/18 1336 06/15/18 1337 06/15/18 1652 06/15/18 2202  HGB 15.9  --   --   --   --   --   --   --   --   --   --   --  10.9*  --   --   --  11.4*  --   --   HCT 52.5*  --   --   --   --   --   --   --   --   --   --   --  33.0*  --   --   --  35.0*  --   --   PLT 272  --   --   --   --   --   --   --   --   --   --   --  76*  --   --   --  81*  --   --   APTT  --   --   --   --   --    < >  --   --   --   --   --   --   --  82* 50* 46*  --  81* <24*  LABPROT 15.2  --   --   --   --   --   --   --   --   --   --   --   --  16.3*  --   --   --   --   --   INR 1.2  --   --   --   --   --   --   --   --   --   --   --   --  1.3*  --   --   --   --   --   HEPARINUNFRC  --   --   --   --   --   --   --   --  0.58  --  0.86*  --  0.79*  --   --   --   --   --   --   CREATININE 2.81*  --   --    < > 1.72*  --  1.53*  --   --    < >  --    < > 1.48* 1.34* 1.28*  --  1.08  --   --   CKTOTAL  --  555*  --   --   --   --  1,937*  --   --   --   --   --   --   --   --   --   --   --   --   TROPONINI 0.06*  --  0.22*  --  1.14*  --   --  4.78*  --   --   --   --   --   --   --   --   --   --   --    < > =  values in this interval not displayed.    Estimated Creatinine Clearance: 63.5 mL/min (by C-G formula based on SCr of 1.08 mg/dL).   Medical History: Past Medical History:  Diagnosis Date  . Hypothyroid     Medications:  Scheduled:  . chlorhexidine  15 mL Mouth Rinse BID  .  famotidine  20 mg Oral BID  . insulin glargine  15 Units Subcutaneous QHS  . mouth rinse  15 mL Mouth Rinse q12n4p  . potassium chloride  20 mEq Oral Once  . sodium chloride flush  10-40 mL Intracatheter Q12H  . sodium chloride flush  3 mL Intravenous Q12H  . traZODone  100 mg Oral QHS    Assessment: Patient was admitted as a respiratory distress and intubated. Patient was placed on heparin drip for concern of STEMI w/ trops rising up to 4.78 currently. Initially, patient's CBC was WNL, after 3 days, this am's CBC: hgb 15.9 >> 10.9; plts 272 >> 76. RN states patient is having some hematuria. Spoke to NP to inform her concerning HIT--NP agrees with plan.  Will repeat aPTT, PT/INR and CMP to ensure patient is not in liver fx since it has been more than 24 hours.  Goal of Therapy:  aPTT 50 - 95 seconds seconds Monitor platelets by anticoagulation protocol: Yes   Plan:  02/29 @ 2200 aPTT < 24 seconds subtherapeutic. Per RN drip has been running w/o issue. Will increase rate by 20% to 0.72 mcg/kg/min and will recheck aPTT @ 0230.  Thomasene Ripple, PharmD, BCPS Clinical Pharmacist 06/16/2018 12:07 AM

## 2018-06-16 NOTE — Progress Notes (Signed)
[facility] - [place] at [facility]     PATIENT NAME: Joseph Luna    MR#:  [id number]  DATE OF BIRTH:  January 06, 1951  SUBJECTIVE:   Patient with continued agitation last night, requiring increased precedex. Resting comfortably this morning.  REVIEW OF SYSTEMS:   unable to obtain- sedated  Nutrition: NPO Tolerating Diet: No as patient is sedated Tolerating PT: Await Eval.   DRUG ALLERGIES:  No Known Allergies  VITALS:  Blood pressure 138/74, pulse (!) 106, temperature (!) 100.6 F (38.1 C), resp. rate (!) 21, height 6' (1.829 m), weight 67.6 kg, SpO2 98 %.  PHYSICAL EXAMINATION:   Physical Exam GENERAL:  68 y.o.-year-old patient lying in bed, sleeping comfortably. EYES: Pupils equal, round, reactive to light. No scleral icterus. HEENT: Head atraumatic, normocephalic. ET and OG tubes in place.  NECK:  Supple, no jugular venous distention. No thyroid enlargement, no tenderness.  LUNGS: Lungs CTAB,  no wheezing, rales, rhonchi. No use of accessory muscles of respiration.  CARDIOVASCULAR: RRR, S1, S2 normal. No murmurs, rubs, or gallops.  ABDOMEN: Soft, nontender, nondistended. Bowel sounds present. No organomegaly or mass.  EXTREMITIES: No cyanosis, clubbing or edema b/l.    NEUROLOGIC: Sedated  PSYCHIATRIC: Sedated  SKIN: No obvious rash, lesion, or ulcer.   LABORATORY PANEL:   CBC Recent Labs  Lab 06/16/18 0318  WBC 6.8  HGB 11.2*  HCT 34.9*  PLT 67*   ------------------------------------------------------------------------------------------------------------------  Chemistries  Recent Labs  Lab 06/15/18 0538  06/16/18 0318  NA 152*   < > 151*  K 3.9   < > 4.1  CL 123*   < > 122*  CO2 24   < > 21*  GLUCOSE 320*   < > 280*  BUN 29*   < > 22  CREATININE 1.34*   < > 1.17  CALCIUM 7.1*   < > 7.5*  MG  --    < > 2.0  AST 66*  --   --   ALT 24  --   --   ALKPHOS 44  --   --   BILITOT 0.7  --   --    < > = values in this interval not  displayed.   ------------------------------------------------------------------------------------------------------------------  Cardiac Enzymes Recent Labs  Lab 06/14/18 0855  TROPONINI 4.78*   ------------------------------------------------------------------------------------------------------------------  RADIOLOGY:  No results found.   ASSESSMENT AND PLAN:   68 yo male w/ hx of Hypothyroidism who was found unresponsive and noted to be encephalopathic.  Altered mental status/encephalopathy-this is metabolic in nature secondary to acute DKA and severe hyperglycemia.  Will follow mental status when patient is no longer sedated. Currently on Precedex this morning for agitation.  Acute DKA- resolved. Patient apparently was not on diabetic meds prior to coming to the hospital.  Blood sugars improving, although did have a blood sugar to 503 at lunch. -Off insulin drip -Continue Lantus and SSI  Elevated troponin- demand ischemia vs NSTEMI. troponin has trended up as high as 4. - Cardiology consulted- continue argatroban for 24 to 48 hours, will consider further work-up depending on how patient does. -Continue aspirin and beta blocker  Congestive heart failure with mid-range EF- ECHO with EF 40-45%. -Unable to start beta blocker/ACE due to hypotension  Acute kidney injury- resolved with IV fluids  Hypernatremia- improving  Acute hypoxic respiratory failure- due to aspiration pneumonia -Continue unasyn  Hypotension-likely secondary to hypovolemic shock from DKA and dehydration. -Continue Levophed  GERD-continue Protonix.  Thrombocytopenia- platelets 67 -Heparin stopped,  on argatroban   All the records are reviewed and case discussed with Care Management/Social Worker. Management plans discussed with the patient, family and they are in agreement.  CODE STATUS: Full code  DVT Prophylaxis: argatroban   TOTAL TIME TAKING CARE OF THIS PATIENT: 35 minutes.   POSSIBLE D/C  unclear, DEPENDING ON CLINICAL CONDITION and progress.   Jinny Blossom  M.D on 06/16/2018 at 8:09 PM  Between 7am to 6pm - Pager [phone]  After 6pm go to [email or web address] - Social research officer, government  [facility] [place] [facility]  Office  [phone]  CC: Primary care physician; Patient, No Pcp Per

## 2018-06-16 NOTE — Progress Notes (Signed)
[facility] Cardiology  SUBJECTIVE: Patient sitting up, denies chest pain, desires to go home   Vitals:   06/16/18 0800 06/16/18 0830 06/16/18 0900 06/16/18 0930  BP: 118/66 (!) 100/53 (!) 107/58 (!) 94/55  Pulse: 87 67 76 77  Resp: 19 (!) 21 18 15   Temp: 98.2 F (36.8 C) 98.4 F (36.9 C) 98.6 F (37 C) 98.6 F (37 C)  TempSrc: Bladder     SpO2: 100% 100% 100% 100%  Weight:      Height:         Intake/Output Summary (Last 24 hours) at 06/16/2018 1761 Last data filed at 06/16/2018 0800 Gross per 24 hour  Intake 2029.78 ml  Output 2550 ml  Net -520.22 ml      PHYSICAL EXAM  General: Well developed, well nourished, in no acute distress HEENT:  Normocephalic and atramatic Neck:  No JVD.  Lungs: Clear bilaterally to auscultation and percussion. Heart: HRRR . Normal S1 and S2 without gallops or murmurs.  Abdomen: Bowel sounds are positive, abdomen soft and non-tender  Msk:  Back normal, normal gait. Normal strength and tone for age. Extremities: No clubbing, cyanosis or edema.   Neuro: Alert and oriented X 3. Psych:  Good affect, responds appropriately   LABS: Basic Metabolic Panel: Recent Labs    06/15/18 1336 06/15/18 1337 06/16/18 0318  NA  --  153* 151*  K  --  3.9 4.1  CL  --  123* 122*  CO2  --  24 21*  GLUCOSE  --  244* 280*  BUN  --  25* 22  CREATININE  --  1.08 1.17  CALCIUM  --  7.4* 7.5*  MG 2.0  --  2.0  PHOS 1.9*  --  2.3*   Liver Function Tests: Recent Labs    06/14/18 0705 06/15/18 0538  AST 40 66*  ALT 20 24  ALKPHOS 51 44  BILITOT 0.6 0.7  PROT 5.1* 4.8*  ALBUMIN 2.8* 2.4*   Recent Labs    06/13/18 1455 06/13/18 2032 06/14/18 0705  LIPASE 326*  --   --   AMYLASE  --  1,300* 743*   CBC: Recent Labs    06/13/18 1455  06/15/18 1337 06/16/18 0318  WBC 27.5*   < > 10.6* 6.8  NEUTROABS 22.9*  --  7.6  --   HGB 15.9   < > 11.4* 11.2*  HCT 52.5*   < > 35.0* 34.9*  MCV 104.2*   < > 97.0 98.9  PLT 272   < > 81* 67*   < > = values in  this interval not displayed.   Cardiac Enzymes: Recent Labs    06/13/18 1541 06/13/18 2032 06/14/18 0233 06/14/18 0705 06/14/18 0855  CKTOTAL 555*  --   --  1,937*  --   TROPONINI  --  0.22* 1.14*  --  4.78*   BNP: Invalid input(s): POCBNP D-Dimer: No results for input(s): DDIMER in the last 72 hours. Hemoglobin A1C: No results for input(s): HGBA1C in the last 72 hours. Fasting Lipid Panel: Recent Labs    06/14/18 0705  TRIG 213*   Thyroid Function Tests: Recent Labs    06/13/18 2032  TSH 0.100*  T4TOTAL 4.9   Anemia Panel: No results for input(s): VITAMINB12, FOLATE, FERRITIN, TIBC, IRON, RETICCTPCT in the last 72 hours.  No results found.   Echo LVEF 40 to 45%  TELEMETRY: Sinus rhythm:  ASSESSMENT AND PLAN:  Active Problems:   Acute respiratory failure (HCC)  Protein-calorie malnutrition, severe   Pressure injury of skin    1.  Elevated troponin, 0.06, 0.22, 1.14, 4.78, in the setting of DKA with severe metabolic acidosis with rhabdomyolysis, and acute hypoxic respiratory failure due to aspiration pneumonia.  ECG nondiagnostic.  2D echocardiogram reveals mildly reduced left ventricular function.  Patient denies history of chest pain.  Probable demand supply ischemia. 2.  Thrombocytopenia, possible HIT, on argatroban x 48 hours 3.  Respiratory failure, status post extubation 4.  DKA/diabetic coma  Recommendations  1.  Agree with overall current therapy 2.  Defer invasive cardiac evaluation 3.  Start aspirin 81 mg daily 4.  Metoprolol succinate 50 mg daily 5.  DC argatroban 6.  Defer further cardiac diagnostics at this time  Off for now, please call if any questions   Marcina Millard, MD, PhD, [facility] 06/16/2018 9:49 AM

## 2018-06-16 NOTE — Progress Notes (Signed)
Pt noted to have increasing agitation and confusion at beginning of shift. Precedex gtt was increased with little therapeutic effect. At approximately 2200 pt became verbally and physically aggressive, attempting to kick nursing and respiratory staff. Pt was also attempting to elope bed and removed an IV from his arm. Luci Bank, NP was notified and placed orders for Haldol 2 mg. Haldol had no therapeutic effect and orders for 1 mg ativan were placed. Pt became relaxed and fell asleep after ativan administration. Pt is still resting calmly and precedex gtt is being titrated down, telesitter is present. Will continue to monitor.

## 2018-06-16 NOTE — Progress Notes (Signed)
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  No Known Allergies  Patient Measurements: Height: 6' (182.9 cm) Weight: 149 lb 0.5 oz (67.6 kg) IBW/kg (Calculated) : 77.6  Vital Signs: Temp: 98.2 F (36.8 C) (03/01 0000) BP: 97/56 (03/01 0000) Pulse Rate: 55 (03/01 0000)  Labs: Recent Labs    06/13/18 1455 06/13/18 1541 06/13/18 2032  06/14/18 0233  06/14/18 0705 06/14/18 0855 06/14/18 1037  06/14/18 1909  06/15/18 0224 06/15/18 0538 06/15/18 1105  06/15/18 1337 06/15/18 1652 06/15/18 2202 06/16/18 0318  HGB 15.9  --   --   --   --   --   --   --   --   --   --   --  10.9*  --   --   --  11.4*  --   --  11.2*  HCT 52.5*  --   --   --   --   --   --   --   --   --   --   --  33.0*  --   --   --  35.0*  --   --  34.9*  PLT 272  --   --   --   --   --   --   --   --   --   --   --  76*  --   --   --  81*  --   --  67*  APTT  --   --   --   --   --    < >  --   --   --   --   --   --   --  82* 50*   < >  --  81* <24* 34  LABPROT 15.2  --   --   --   --   --   --   --   --   --   --   --   --  16.3*  --   --   --   --   --   --   INR 1.2  --   --   --   --   --   --   --   --   --   --   --   --  1.3*  --   --   --   --   --   --   HEPARINUNFRC  --   --   --   --   --   --   --   --  0.58  --  0.86*  --  0.79*  --   --   --   --   --   --   --   CREATININE 2.81*  --   --    < > 1.72*  --  1.53*  --   --    < >  --    < > 1.48* 1.34* 1.28*  --  1.08  --   --  1.17  CKTOTAL  --  555*  --   --   --   --  1,937*  --   --   --   --   --   --   --   --   --   --   --   --   --   TROPONINI 0.06*  --  0.22*  --  1.14*  --   --  4.78*  --   --   --   --   --   --   --   --   --   --   --   --    < > = values in this interval not displayed.    Estimated Creatinine Clearance: 58.6 mL/min (by C-G formula based on SCr of 1.17 mg/dL).   Medical History: Past Medical History:  Diagnosis Date  . Hypothyroid     Medications:  Scheduled:   . chlorhexidine  15 mL Mouth Rinse BID  . famotidine  20 mg Oral BID  . insulin glargine  15 Units Subcutaneous QHS  . mouth rinse  15 mL Mouth Rinse q12n4p  . potassium chloride  20 mEq Oral Once  . sodium chloride flush  10-40 mL Intracatheter Q12H  . sodium chloride flush  3 mL Intravenous Q12H  . traZODone  100 mg Oral QHS    Assessment: Patient was admitted as a respiratory distress and intubated. Patient was placed on heparin drip for concern of STEMI w/ trops rising up to 4.78 currently. Initially, patient's CBC was WNL, after 3 days, this am's CBC: hgb 15.9 >> 10.9; plts 272 >> 76. RN states patient is having some hematuria. Spoke to NP to inform her concerning HIT--NP agrees with plan.  Will repeat aPTT, PT/INR and CMP to ensure patient is not in liver fx since it has been more than 24 hours.  Goal of Therapy:  aPTT 50 - 95 seconds seconds Monitor platelets by anticoagulation protocol: Yes   Plan:  03/01 @ 0300 aPTT 34 seconds subtherapeutic. Per RN drip has been running w/o issue. Will increase rate by 20% to 0.9 mcg/kg/min and will recheck aPTT @ 0730. H/h holding steady, plts still trending low.  Thomasene Ripple, PharmD, BCPS Clinical Pharmacist 06/16/2018 4:36 AM

## 2018-06-16 NOTE — Progress Notes (Signed)
Inpatient Diabetes Program Recommendations  AACE/ADA: New Consensus Statement on Inpatient Glycemic Control   Target Ranges:  Prepandial:   less than 140 mg/dL      Peak postprandial:   less than 180 mg/dL (1-2 hours)      Critically ill patients:  140 - 180 mg/dL   Results for [name], [name] (MRN [id number]) as of 06/16/2018 09:12  Ref. Range 06/16/2018 00:19 06/16/2018 01:19 06/16/2018 02:03 06/16/2018 03:13 06/16/2018 04:23 06/16/2018 05:15 06/16/2018 06:07 06/16/2018 07:12 06/16/2018 07:56  Glucose-Capillary Latest Ref Range: 70 - 99 mg/dL 086 (H) 578 (H) 469 (H) 153 (H) 322 (H) 277 (H) 282 (H) 263 (H) 236 (H)   Review of Glycemic Control  Current orders for Inpatient glycemic control: Lantus 15 units QHS, IV insulin drip  Inpatient Diabetes Program Recommendations:   Insulin - Basal: Patient received Lantus 15 units last night at 22:17 while on IV insulin drip.  Please discontinue Lantus 15 units QHS (order was not continued when IV insulin was restarted) while ordered IV insulin.  SQ insulin at time of transition from IV: At time of transition from IV to SQ insulin, please consider ordering Lantus 20 units Q24H, CBGs Q4H, and Novolog 0-15 units Q4H.  Thanks, Joseph Penner, RN, MSN, CDE Diabetes Coordinator Inpatient Diabetes Program [phone] (Team Pager from 8am to 5pm)

## 2018-06-17 LAB — CBC WITH DIFFERENTIAL/PLATELET
Abs Immature Granulocytes: 0.04 10*3/uL (ref 0.00–0.07)
Basophils Absolute: 0 10*3/uL (ref 0.0–0.1)
Basophils Relative: 0 %
Eosinophils Absolute: 0 10*3/uL (ref 0.0–0.5)
Eosinophils Relative: 0 %
HCT: 35.7 % — ABNORMAL LOW (ref 39.0–52.0)
Hemoglobin: 11.8 g/dL — ABNORMAL LOW (ref 13.0–17.0)
IMMATURE GRANULOCYTES: 1 %
Lymphocytes Relative: 17 %
Lymphs Abs: 1.4 10*3/uL (ref 0.7–4.0)
MCH: 31.5 pg (ref 26.0–34.0)
MCHC: 33.1 g/dL (ref 30.0–36.0)
MCV: 95.2 fL (ref 80.0–100.0)
Monocytes Absolute: 0.5 10*3/uL (ref 0.1–1.0)
Monocytes Relative: 7 %
NEUTROS PCT: 75 %
Neutro Abs: 6.1 10*3/uL (ref 1.7–7.7)
PLATELETS: 82 10*3/uL — AB (ref 150–400)
RBC: 3.75 MIL/uL — ABNORMAL LOW (ref 4.22–5.81)
RDW: 13.5 % (ref 11.5–15.5)
WBC: 8 10*3/uL (ref 4.0–10.5)
nRBC: 0 % (ref 0.0–0.2)

## 2018-06-17 LAB — GLUCOSE, CAPILLARY
Glucose-Capillary: 148 mg/dL — ABNORMAL HIGH (ref 70–99)
Glucose-Capillary: 212 mg/dL — ABNORMAL HIGH (ref 70–99)
Glucose-Capillary: 214 mg/dL — ABNORMAL HIGH (ref 70–99)
Glucose-Capillary: 226 mg/dL — ABNORMAL HIGH (ref 70–99)
Glucose-Capillary: 255 mg/dL — ABNORMAL HIGH (ref 70–99)
Glucose-Capillary: 301 mg/dL — ABNORMAL HIGH (ref 70–99)

## 2018-06-17 LAB — BASIC METABOLIC PANEL
Anion gap: 9 (ref 5–15)
BUN: 11 mg/dL (ref 8–23)
CHLORIDE: 116 mmol/L — AB (ref 98–111)
CO2: 22 mmol/L (ref 22–32)
CREATININE: 0.88 mg/dL (ref 0.61–1.24)
Calcium: 7.6 mg/dL — ABNORMAL LOW (ref 8.9–10.3)
GFR calc Af Amer: >60 mL/min (ref >60)
GFR calc non Af Amer: >60 mL/min (ref >60)
Glucose, Bld: 173 mg/dL — ABNORMAL HIGH (ref 70–99)
Potassium: 3 mmol/L — ABNORMAL LOW (ref 3.5–5.1)
Sodium: 147 mmol/L — ABNORMAL HIGH (ref 135–145)

## 2018-06-17 LAB — PHOSPHORUS: Phosphorus: 2.5 mg/dL (ref 2.5–4.6)

## 2018-06-17 LAB — HEMOGLOBIN A1C
Hgb A1c MFr Bld: >15.5 % — ABNORMAL HIGH (ref 4.8–5.6)
Mean Plasma Glucose: >398 mg/dL

## 2018-06-17 LAB — MAGNESIUM: Magnesium: 1.8 mg/dL (ref 1.7–2.4)

## 2018-06-17 LAB — HEPARIN INDUCED PLATELET AB (HIT ANTIBODY): Heparin Induced Plt Ab: 0.152 OD (ref 0.000–0.400)

## 2018-06-17 MED ORDER — ADULT MULTIVITAMIN W/MINERALS CH
1.0000 | ORAL_TABLET | Freq: Every day | ORAL | Status: DC
  Administered 2018-06-18 – 2018-06-19 (×2): 1 via ORAL
  Filled 2018-06-17 (×2): qty 1

## 2018-06-17 MED ORDER — INSULIN ASPART 100 UNIT/ML ~~LOC~~ SOLN
0.0000 [IU] | Freq: Three times a day (TID) | SUBCUTANEOUS | Status: DC
  Administered 2018-06-17: 5 [IU] via SUBCUTANEOUS
  Administered 2018-06-18: 8 [IU] via SUBCUTANEOUS
  Administered 2018-06-18: 2 [IU] via SUBCUTANEOUS
  Administered 2018-06-18 – 2018-06-19 (×4): 5 [IU] via SUBCUTANEOUS
  Filled 2018-06-17 (×7): qty 1

## 2018-06-17 MED ORDER — INSULIN ASPART 100 UNIT/ML ~~LOC~~ SOLN
0.0000 [IU] | Freq: Every day | SUBCUTANEOUS | Status: DC
  Administered 2018-06-17 – 2018-06-18 (×2): 2 [IU] via SUBCUTANEOUS
  Filled 2018-06-17 (×2): qty 1

## 2018-06-17 MED ORDER — INSULIN GLARGINE 100 UNIT/ML ~~LOC~~ SOLN
5.0000 [IU] | Freq: Once | SUBCUTANEOUS | Status: AC
  Administered 2018-06-17: 5 [IU] via SUBCUTANEOUS
  Filled 2018-06-17: qty 0.05

## 2018-06-17 MED ORDER — INSULIN STARTER KIT- PEN NEEDLES (ENGLISH)
1.0000 | Freq: Once | Status: AC
  Administered 2018-06-17: 1

## 2018-06-17 MED ORDER — LIVING WELL WITH DIABETES BOOK
Freq: Once | Status: AC
  Administered 2018-06-17: 11:00:00

## 2018-06-17 MED ORDER — NEPRO/CARBSTEADY PO LIQD
237.0000 mL | Freq: Two times a day (BID) | ORAL | Status: DC
  Administered 2018-06-17 – 2018-06-19 (×4): 237 mL via ORAL

## 2018-06-17 MED ORDER — POTASSIUM CHLORIDE 20 MEQ PO PACK: 60.0000 meq | PACK | Freq: Two times a day (BID) | ORAL | Status: DC

## 2018-06-17 MED ORDER — POTASSIUM CHLORIDE 20 MEQ PO PACK: 40.0000 meq | PACK | Freq: Two times a day (BID) | ORAL | Status: DC

## 2018-06-17 MED ORDER — MAGNESIUM SULFATE 2 GM/50ML IV SOLN
2.0000 g | Freq: Once | INTRAVENOUS | Status: AC
  Administered 2018-06-17: 2 g via INTRAVENOUS
  Filled 2018-06-17: qty 50

## 2018-06-17 MED ORDER — POTASSIUM CHLORIDE 20 MEQ PO PACK
40.0000 meq | PACK | ORAL | Status: AC
  Administered 2018-06-17 (×2): 40 meq via ORAL
  Filled 2018-06-17 (×2): qty 2

## 2018-06-17 MED ORDER — INSULIN GLARGINE 100 UNIT/ML ~~LOC~~ SOLN
25.0000 [IU] | Freq: Every day | SUBCUTANEOUS | Status: DC
  Filled 2018-06-17: qty 0.25

## 2018-06-17 MED ORDER — INSULIN ASPART 100 UNIT/ML ~~LOC~~ SOLN
5.0000 [IU] | Freq: Three times a day (TID) | SUBCUTANEOUS | Status: DC
  Administered 2018-06-17: 5 [IU] via SUBCUTANEOUS
  Filled 2018-06-17: qty 1

## 2018-06-17 NOTE — Progress Notes (Signed)
Nutrition Follow Up Note   DOCUMENTATION CODES:   Severe malnutrition in context of chronic illness  INTERVENTION:   Nepro Shake po BID, each supplement provides 425 kcal and 19 grams protein  MVI daily  NUTRITION DIAGNOSIS:   Severe Malnutrition related to chronic illness(uncontrolled DM) as evidenced by moderate to severe fat depletions, moderate to severe muscle depletions.  GOAL:   Patient will meet greater than or equal to 90% of their needs  -progressing   MONITOR:   PO intake, Supplement acceptance, Labs, Weight trends, Skin, I & O's  ASSESSMENT:   68 year old male with past medical history of diabetes, was found unresponsive at home, required intubation in the ED for airway protection and impending respiratory failure. Patient with severe DKA and diabetic coma, severe metabolic acidosis, and AKI.  Patient meets SIRS criteria, concern for possible pancreatitis and possible aspiration pneumonia.  Pt extubated 2/29. Pt lethargic today, will wait to provide education once pt is more alert. Pt initiated on heart healthy/carbohydrate modified diet and ate 75% of breakfast today. RD will add supplements to help pt meet his estimated needs. Will plan to provide education at follow up. No new weight since 2/27; recommend obtain new weight.   Medications reviewed and include: aspirin, pepcid, insulin, KCl, unasyn   Labs reviewed: Na 147(H), K 3.0(L), Ca 7.6(L), P 2.5 wnl, Mg 1.8 wnl cbgs- 255, 148, 214, 301 x 24 hrs  Diet Order:   Diet Order            Diet heart healthy/carb modified Room service appropriate? Yes; Fluid consistency: Thin  Diet effective now             EDUCATION NEEDS:   Not appropriate for education at this time  Skin:  Skin Assessment: Reviewed RN Assessment(Stage I sacrum )  Last BM:  3/2- type 6  Height:   Ht Readings from Last 1 Encounters:  06/13/18 6' (1.829 m)    Weight:   Wt Readings from Last 1 Encounters:  06/13/18 67.6 kg     Ideal Body Weight:  80.9 kg  BMI:  Body mass index is 20.21 kg/m.  Estimated Nutritional Needs:   Kcal:  1649kcal/day   Protein:  101-115g/day  Fluid:  1.7L/day   Betsey Holiday MS, RD, LDN Pager #- [phone] Office#- [phone] After Hours Pager: [phone]

## 2018-06-17 NOTE — Evaluation (Signed)
Clinical/Bedside Swallow Evaluation Patient Details  Name: Joseph Luna MRN: [id number] Date of Birth: 12/29/1950  Today's Date: 06/17/2018 Time: SLP Start Time (ACUTE ONLY): 1500 SLP Stop Time (ACUTE ONLY): 1545 SLP Time Calculation (min) (ACUTE ONLY): 45 min  Past Medical History:  Past Medical History:  Diagnosis Date  . Hypothyroid    Past Surgical History:  Past Surgical History:  Procedure Laterality Date  . none     HPI:  Pt is a 68 y.o. male with a known history of hypothyroidism and diabetes(?) with no known previous history of diabetes who was found unresponsive on the floor in his home today.  Last known well was yesterday by family.  Cording to his son when he saw him he was not acting right.  When EMS arrived to his house found him unresponsive and blood sugar was high.  In the emergency room he is noted to have severe hyperglycemia, metabolic acidosis.  To protect his airway he was intubated.  Pt was extubated 2 days later, however, he continued to have agitation during the following day/evening "verbally and physically aggressive, attempting to kick nursing and respiratory staff. Pt was also attempting to elope bed and removed an IV from his arm" receiving Haldol and Precedex.  Pt appears calm this PM w/ Sitter present in room.    Assessment / Plan / Recommendation Clinical Impression  Pt appears to present w/ an adequate oropharyngeal phase swallow function w/ no immediate, overt s/s of aspiration noted w/ thin liquid trials VIA CUP - No straws. This was a brief assessment w/ thin liquid trials at bedside; f/u assessment w/ toleration of solids will be done during a meal tomorrow. NSG had reported pt was "coughing" w/ thin liquids today. Due to recent (brief) oral intubation and mental status decline at the time, pt was given trials at bedside this PM. Pt sat EOB w/ SLP's help. Pt held Cup and consumed ~3-4 ozs total. No overt coughing or decline in vocal quality noted;  respiratory status remained clear b/t trials(actually improved from the slight wetness at baseline prior to po trials). Oral phase was appropriate for bolus management during Cup sip trials; no leakage or residue remained post trials. However, pt was leaning over after trials talking w/ SLP and drooled saliva x1 while blowing his nose also. OM exam appeared grossly [facility] for bolus management and drinking from Cup. Recommend modifying diet to NO STRAWS; only CUP DRINKING to lessen risk for aspiration. Recommend continue w/ current diet w/ NO Straws; general aspiration precautions; PIlls WHOLE IN PUREE for safer swallowing. Pt may need tray setup and sitting EOB or in chair during meals. ST services will f/u tomorrow w/ ongoing assessment. NSG updated.  SLP Visit Diagnosis: Dysphagia, oropharyngeal phase (R13.12)    Aspiration Risk  Mild aspiration risk(but reduced following general aspiration precautions)    Diet Recommendation  Regular diet w/ Thin liquids - NO STRAWS. General aspiration precautions; monitoring for any impulsive behaviors during meals.  Medication Administration: Whole meds with puree(for safer swallowing)    Other  Recommendations Recommended Consults: (Dietician f/u) Oral Care Recommendations: Oral care BID;Patient independent with oral care Other Recommendations: (n/a)   Follow up Recommendations None      Frequency and Duration min 2x/week  1 week       Prognosis Prognosis for Safe Diet Advancement: Good Barriers to Reach Goals: Behavior      Swallow Study   General Date of Onset: 06/13/18 HPI: Pt is a 67 y.o. male  with a known history of hypothyroidism and diabetes(?) with no known previous history of diabetes who was found unresponsive on the floor in his home today.  Last known well was yesterday by family.  Cording to his son when he saw him he was not acting right.  When EMS arrived to his house found him unresponsive and blood sugar was high.  In the emergency  room he is noted to have severe hyperglycemia, metabolic acidosis.  To protect his airway he was intubated.  Pt was extubated 2 days later, however, he continued to have agitation during the following day/evening "verbally and physically aggressive, attempting to kick nursing and respiratory staff. Pt was also attempting to elope bed and removed an IV from his arm" receiving Haldol and Precedex.  Pt appears calm this PM w/ Sitter present in room.  Type of Study: Bedside Swallow Evaluation Previous Swallow Assessment: none Diet Prior to this Study: Regular;Thin liquids Temperature Spikes Noted: No(wbc 8.0) Respiratory Status: Room air History of Recent Intubation: Yes Length of Intubations (days): 3 days Date extubated: 06/15/18 Behavior/Cognition: Alert;Cooperative;Pleasant mood;Distractible;Requires cueing;Impulsive(min) Oral Cavity Assessment: Within Functional Limits Oral Care Completed by SLP: Recent completion by staff Oral Cavity - Dentition: Missing dentition Vision: Functional for self-feeding Self-Feeding Abilities: Able to feed self;Needs set up(sat EOB) Patient Positioning: Upright in bed(EOB) Baseline Vocal Quality: Normal Volitional Cough: Strong Volitional Swallow: Able to elicit    Oral/Motor/Sensory Function Overall Oral Motor/Sensory Function: Within functional limits   Ice Chips Ice chips: Not tested   Thin Liquid Thin Liquid: Within functional limits Presentation: Cup;Self Fed(~3-4 ozs total sitting EOB and precautions) Other Comments: pt drank slowly and used smaller sips via Cup    Nectar Thick Nectar Thick Liquid: Not tested   Honey Thick Honey Thick Liquid: Not tested   Puree Puree: Not tested   Solid     Solid: Not tested Other Comments: will f/u w/ toleration of solids at a meal       Jerilynn Som, MS, CCC-SLP Kellie Chisolm 06/17/2018,4:57 PM

## 2018-06-17 NOTE — Progress Notes (Signed)
[facility] [place] [facility]     PATIENT NAME: Joseph Luna    MR#:  [id number]  DATE OF BIRTH:  February 29, 1951  SUBJECTIVE:   Patient is doing much better this morning.  He says he feels overall pretty good.  He is alert and answering questions appropriately.  No agitation.  Off Precedex drip.  REVIEW OF SYSTEMS:  General: No fevers, no chills HEENT: No blurred vision or double vision Respiratory: No cough or shortness of breath Cardiac: No chest pain or palpitations Abdomen: No nausea or vomiting Genitourinary: No dysuria or urinary frequency Skin: No rashes or lesions Neuro: No headaches or dizziness Psych: No depression or anxiety   DRUG ALLERGIES:   Allergies  Allergen Reactions  . Heparin     VITALS:  Blood pressure (!) 103/51, pulse 92, temperature 98.5 F (36.9 C), temperature source Oral, resp. rate 20, height 6' (1.829 m), weight 67.2 kg, SpO2 96 %.  PHYSICAL EXAMINATION:   Physical Exam GENERAL:  68 y.o.-year-old patient lying in bed, alert and talkative. EYES: Pupils equal, round, reactive to light. No scleral icterus. HEENT: Head atraumatic, normocephalic.  No scleral icterus.  EOMI.  Moist mucous membranes NECK:  Supple, no jugular venous distention. No thyroid enlargement, no tenderness.  LUNGS: Lungs CTAB,  no wheezing, rales, rhonchi. No use of accessory muscles of respiration.  CARDIOVASCULAR: RRR, S1, S2 normal. No murmurs, rubs, or gallops.  ABDOMEN: Soft, nontender, nondistended. Bowel sounds present. No organomegaly or mass.  EXTREMITIES: No cyanosis, clubbing or edema b/l.    NEUROLOGIC: CN II through XII grossly intact, no focal deficits,+ global weakness, sensation intact throughout PSYCHIATRIC: Alert and oriented x3, normal thought content SKIN: No obvious rash, lesion, or ulcer.   LABORATORY PANEL:   CBC Recent Labs  Lab 06/17/18 0535  WBC 8.0  HGB 11.8*  HCT 35.7*  PLT 82*    ------------------------------------------------------------------------------------------------------------------  Chemistries  Recent Labs  Lab 06/15/18 0538  06/17/18 0535  NA 152*   < > 147*  K 3.9   < > 3.0*  CL 123*   < > 116*  CO2 24   < > 22  GLUCOSE 320*   < > 173*  BUN 29*   < > 11  CREATININE 1.34*   < > 0.88  CALCIUM 7.1*   < > 7.6*  MG  --    < > 1.8  AST 66*  --   --   ALT 24  --   --   ALKPHOS 44  --   --   BILITOT 0.7  --   --    < > = values in this interval not displayed.   ------------------------------------------------------------------------------------------------------------------  Cardiac Enzymes Recent Labs  Lab 06/14/18 0855  TROPONINI 4.78*   ------------------------------------------------------------------------------------------------------------------  RADIOLOGY:  No results found.   ASSESSMENT AND PLAN:   68 yo male w/ hx of hypothyroidism who was found unresponsive and noted to be encephalopathic.  Altered mental status/encephalopathy- resolved.  Patient now stable off Precedex drip.  Plan for transfer to the floor.  Acute DKA- resolved. Patient apparently was not on diabetic meds prior to coming to the hospital.  Blood sugars still elevated, but improved since admission -Increase Lantus to 25 units daily -Add NovoLog 5 units 3 times daily with meals in addition to sliding scale  Elevated troponin- demand ischemia vs NSTEMI. troponin has trended up as high as 4. - Cardiology consulted- discontinued argatroban, plan to defer invasive cardiac evaluation at this  time -Continue aspirin and beta blocker  Congestive heart failure with mid-range EF- ECHO with EF 40-45%. -Continue Coreg and aspirin  Aspiration pneumonia- patient is stable on room air -Continue unasyn  Hypotension-likely secondary to hypovolemic shock from DKA and dehydration. -Stable off levophed  GERD-continue Protonix.  All the records are reviewed and case  discussed with Care Management/Social Worker. Management plans discussed with the patient, family and they are in agreement.  CODE STATUS: Full code   TOTAL TIME TAKING CARE OF THIS PATIENT: 35 minutes.   POSSIBLE D/C 1-2 days, DEPENDING ON CLINICAL CONDITION and progress.   Jinny Blossom Marquel Spoto M.D on 06/17/2018 at 8:26 PM  Between 7am to 6pm - Pager [phone]  After 6pm go to [email or web address] - Social research officer, government  [facility] [place]  Office  [phone]  CC: Primary care physician; Patient, No Pcp Per

## 2018-06-17 NOTE — Care Management Note (Signed)
Case Management Note  Patient Details  Name: Sarthak Rubenstein MRN: [id number] Date of Birth: 12/10/1950  Subjective/Objective:                    Action/Plan: Met with the patient to discuss DC plan and needs The patient stated that he does not have a PCP, I provided the patient with the resource list for PCP including the [facility], he requested that I call to make an appointment for him.  I attempted to get the patient an appointment  With [facility], the receptionist stated that their new schedule for taking new patients is not out, the better availability would be at [facility] or [place] The number to [place] clinic is [phone] and the number to [facility] is [phone] Will discuss with the patient and let him know the choices  The patient states he will use [facility] for medication but does not have drug coverage,  He said he will; change that soon.    Expected Discharge Date:                  Expected Discharge Plan:     In-House Referral:     Discharge planning Services  CM Consult, Follow-up appt scheduled, Other - See comment  Post Acute Care Choice:    Choice offered to:     DME Arranged:    DME Agency:     HH Arranged:    HH Agency:     Status of Service:  In process, will continue to follow  If discussed at Long Length of Stay Meetings, dates discussed:    Additional Comments:  Su Hilt, RN 06/17/2018, 3:51 PM

## 2018-06-17 NOTE — Care Management (Signed)
Notified the patient that [facility] can't take patients for the next 4-6 weeks, He gave his family permission and requested for them to call local PCP to see about getting in as a new patient.  Family will call to get the patient an appointment\  The patient feels he may need to go to rehab to gain strength, I explained that the Physical Therapist will see the patient and make a recommendation and then if they recommend Rehab we would send out a bed request, the rehab facilities that have beds available will send bed offers, at that time he will have a choice and then once choice is made Insurance Auth will be sought if required.  NO further CM needs at this time

## 2018-06-17 NOTE — Progress Notes (Addendum)
Inpatient Diabetes Program Recommendations  AACE/ADA: New Consensus Statement on Inpatient Glycemic Control   Target Ranges:  Prepandial:   less than 140 mg/dL      Peak postprandial:   less than 180 mg/dL (1-2 hours)      Critically ill patients:  140 - 180 mg/dL  Results for Joseph Luna, Joseph Luna (MRN [id number]) as of 06/17/2018 14:36  Ref. Range 06/17/2018 10:52 06/17/2018 11:44  Glucose-Capillary Latest Ref Range: 70 - 99 mg/dL    Lantus 20 units 450 (H)  Novolog 11 units   Results for Joseph Luna, Joseph Luna (MRN [id number]) as of 06/17/2018 08:14  Ref. Range 06/16/2018 13:16 06/16/2018 15:52 06/16/2018 19:44 06/16/2018 23:19 06/17/2018 00:49 06/17/2018 03:39 06/17/2018 08:00  Glucose-Capillary Latest Ref Range: 70 - 99 mg/dL 99  Lantus 20 units 491 (H)  Novolog 5 units 198 (H)  Novolog 3 units 214 (H) 255 (H)  Novolog 8 units 148 (H)  Novolog 2 units 214 (H)  Novolog 5 units  Results for Joseph Luna, Joseph Luna (MRN [id number]) as of 06/17/2018 14:36  Ref. Range 06/13/2018 20:32  Hemoglobin A1C Latest Ref Range: 4.8 - 5.6 % >15.5 (H)   Review of Glycemic Control  Diabetes history: NO Outpatient Diabetes medications: NA Current orders for Inpatient glycemic control: Lantus 20 units daily, Novolog 0-15 units Q4H  Inpatient Diabetes Program Recommendations:   Insulin - Basal: Please consider increasing Lantus to 25 units daily.  Insulin-Meal Coverage: Please consider ordering Novolog 5 units TID with meals for meal coverage if patient eats at least 50% of meals.  HgbA1C: A1C greater than 15.5% on 06/13/18 indicating an average glucose over 398 mg/dl over the past 2-3 months.  NOTE: Spoke with patient about new diabetes diagnosis. Patient alert and appropriately engaged in conversation; however, easily distracted and needed to be redirected back to discussing DM. Patient states that he has Medicare but no prescription coverage so medications will need to be affordable. Patient states that he prefers to use [facility] to get  medications and he can not afford expensive medications. Patient states that he does not have a PCP (has been years since he seen a doctor).  Informed patient CM would be consulted for assistance with follow up as he will need a provider to follow up with especially since he will be discharged on insulin.  Discussed A1C results (>15.5% on 06/13/18) and explained what an A1C is and informed patient that his current A1C indicates an average glucose of greater than 398 mg/dl over the past 2-3 months. Discussed basic pathophysiology of DM Type 2, basic home care, importance of checking CBGs and maintaining good CBG control to prevent long-term and short-term complications. Reviewed glucose and A1C goals.  Reviewed signs and symptoms of hyperglycemia and hypoglycemia along with treatment for both. Discussed impact of nutrition, exercise, stress, sickness, and medications on diabetes control. Reviewed Living Well with diabetes booklet and encouraged patient to read through entire book (states he does not have is glasses here but family is suppose to be bringing his glasses to the hospital). Discussed current insulin as ordered and explained that if cost of medications is a concern, he may need to be prescribed Novolin 70/30 since it is more affordable. Informed patient that Novolin 70/30 can be purchased at [facility] for $25 per vial. Patient notes that he is feeling overwhelmed but all the information discussed. Informed patient that Diabetes Coordinator would come back tomorrow to review information discussed today and to discuss insulin further.   Patient verbalized understanding of information discussed  and he states that he has no further questions at this time related to diabetes.   RNs to provide ongoing basic DM education at bedside with this patient and engage patient to actively check blood glucose and administer insulin injections.   Thanks, Orlando Penner, RN, MSN, CDE Diabetes Coordinator Inpatient Diabetes  Program [phone] (Team Pager from 8am to 5pm)

## 2018-06-17 NOTE — Progress Notes (Signed)
Pharmacy Electrolyte Monitoring Consult:  Pharmacy consulted to assist in monitoring and replacing electrolytes in this 69 y.o. male admitted on 06/13/2018 with Respiratory Distress  Labs:  Sodium (mmol/L)  Date Value  06/17/2018 147 (H)   Potassium (mmol/L)  Date Value  06/17/2018 3.0 (L)   Magnesium (mg/dL)  Date Value  11/57/2620 1.8   Phosphorus (mg/dL)  Date Value  35/59/7416 2.5   Calcium (mg/dL)  Date Value  38/45/3646 7.6 (L)   Albumin (g/dL)  Date Value  80/32/1224 2.4 (L)   Corrected calcium: 8.9  Assessment/Plan: Patient ordered potassium PO Q4hr x 2 doses and magnesium 2g IV x 1.   Patient previously ordered D5 at 72mL/hr due to hypernatremia - now discontinued when transferred to floor. Continue to monitor sodium.   Patient has been transitioned to the floor. Will continue to replace electrolytes to keep within normal limits.   Will obtain BMP with am labs. Will monitor magnesium/phosphorus as clinically indicated.   Pharmacy will continue to monitor and adjust per consult.   Josefine Fuhr L 06/17/2018 4:25 PM

## 2018-06-17 NOTE — Plan of Care (Signed)
Pt able to remain anxiety free at this time. Pt discussed activity and safety.

## 2018-06-18 ENCOUNTER — Inpatient Hospital Stay: Payer: Medicare Other

## 2018-06-18 LAB — BASIC METABOLIC PANEL
Anion gap: 10 (ref 5–15)
BUN: 9 mg/dL (ref 8–23)
CO2: 20 mmol/L — ABNORMAL LOW (ref 22–32)
Calcium: 7.6 mg/dL — ABNORMAL LOW (ref 8.9–10.3)
Chloride: 117 mmol/L — ABNORMAL HIGH (ref 98–111)
Creatinine, Ser: 0.86 mg/dL (ref 0.61–1.24)
GFR calc Af Amer: >60 mL/min (ref >60)
GFR calc non Af Amer: >60 mL/min (ref >60)
Glucose, Bld: 278 mg/dL — ABNORMAL HIGH (ref 70–99)
Potassium: 3.4 mmol/L — ABNORMAL LOW (ref 3.5–5.1)
Sodium: 147 mmol/L — ABNORMAL HIGH (ref 135–145)

## 2018-06-18 LAB — CULTURE, BLOOD (ROUTINE X 2)
Culture: NO GROWTH
Culture: NO GROWTH
Special Requests: ADEQUATE
Special Requests: ADEQUATE

## 2018-06-18 LAB — CBC
HEMATOCRIT: 38.1 % — AB (ref 39.0–52.0)
Hemoglobin: 12.4 g/dL — ABNORMAL LOW (ref 13.0–17.0)
MCH: 31.2 pg (ref 26.0–34.0)
MCHC: 32.5 g/dL (ref 30.0–36.0)
MCV: 96 fL (ref 80.0–100.0)
Platelets: 79 10*3/uL — ABNORMAL LOW (ref 150–400)
RBC: 3.97 MIL/uL — ABNORMAL LOW (ref 4.22–5.81)
RDW: 13.2 % (ref 11.5–15.5)
WBC: 7.6 10*3/uL (ref 4.0–10.5)
nRBC: 0 % (ref 0.0–0.2)

## 2018-06-18 LAB — GLUCOSE, CAPILLARY
Glucose-Capillary: 240 mg/dL — ABNORMAL HIGH (ref 70–99)
Glucose-Capillary: 271 mg/dL — ABNORMAL HIGH (ref 70–99)
Glucose-Capillary: 234 mg/dL — ABNORMAL HIGH (ref 70–99)
Glucose-Capillary: 205 mg/dL — ABNORMAL HIGH (ref 70–99)

## 2018-06-18 LAB — PHOSPHORUS: Phosphorus: 3.3 mg/dL (ref 2.5–4.6)

## 2018-06-18 LAB — MAGNESIUM: Magnesium: 2.1 mg/dL (ref 1.7–2.4)

## 2018-06-18 MED ORDER — POTASSIUM CHLORIDE CRYS ER 20 MEQ PO TBCR
20.0000 meq | EXTENDED_RELEASE_TABLET | Freq: Once | ORAL | Status: AC
  Administered 2018-06-18: 20 meq via ORAL
  Filled 2018-06-18: qty 1

## 2018-06-18 MED ORDER — AMOXICILLIN-POT CLAVULANATE 875-125 MG PO TABS
1.0000 | ORAL_TABLET | Freq: Two times a day (BID) | ORAL | Status: DC
  Administered 2018-06-18 – 2018-06-19 (×3): 1 via ORAL
  Filled 2018-06-18 (×3): qty 1

## 2018-06-18 MED ORDER — INSULIN ASPART PROT & ASPART (70-30 MIX) 100 UNIT/ML ~~LOC~~ SUSP
21.0000 [IU] | Freq: Two times a day (BID) | SUBCUTANEOUS | Status: DC
  Administered 2018-06-18 – 2018-06-19 (×4): 21 [IU] via SUBCUTANEOUS
  Filled 2018-06-18 (×4): qty 10

## 2018-06-18 MED ORDER — POTASSIUM CHLORIDE CRYS ER 20 MEQ PO TBCR
40.0000 meq | EXTENDED_RELEASE_TABLET | Freq: Once | ORAL | Status: AC
  Administered 2018-06-18: 40 meq via ORAL
  Filled 2018-06-18: qty 2

## 2018-06-18 NOTE — Evaluation (Signed)
Physical Therapy Evaluation Patient Details Name: Joseph Luna MRN: [id number] DOB: October 11, 1950 Today's Date: 06/18/2018   History of Present Illness  68 yo male with onset of aspiration PNA was admitted, noted to have B pleural effusion, bronchitis, metabolic acidosis, intubation early for respiratory failure with hypoxia, elevated troponin from demand, Dka, rhabdomyolysis, hypotension, low K+, and initially unresponsive in acute renal failure.  PMHx:  thoracic DJD, sacral wound with fungal rash,   Clinical Impression  Pt was able to walk with PT today, after moderate assist to stand and control balance at IV pole.  He is difficult to organize due to his tendency to push walking device away from himself.  With dense cues and hands on control is better able to walk with no LOB, although his fecal incontinence is requiring a walk in his room.  Pt sat on side of bed, decided his cath was leaking and then without warning pulled the cath off.  Notified nursing about his cath, after nursing had been in to re-bandage from his episode of incontinence.  Follow acutely for progression of safe movement as tolerated, with care not to allow pt to get up alone due to high fall risk.     Follow Up Recommendations SNF    Equipment Recommendations  Rolling walker with 5" wheels    Recommendations for Other Services       Precautions / Restrictions Precautions Precautions: Fall(catheter, incontinent of urine and feces) Restrictions Weight Bearing Restrictions: No      Mobility  Bed Mobility Overal bed mobility: Needs Assistance Bed Mobility: Supine to Sit;Sit to Supine     Supine to sit: Mod assist Sit to supine: Min assist   General bed mobility comments: mod to lift trunk and min for minor help to legs  Transfers Overall transfer level: Needs assistance Equipment used: Rolling walker (2 wheeled);1 person hand held assist Transfers: Sit to/from Stand Sit to Stand: Mod assist          General transfer comment: mod assist with cues for hand placement  Ambulation/Gait Ambulation/Gait assistance: Min assist Gait Distance (Feet): 30 Feet Assistive device: Rolling walker (2 wheeled);IV Pole Gait Pattern/deviations: Step-through pattern;Wide base of support;Trunk flexed;Shuffle;Decreased stride length Gait velocity: redcued Gait velocity interpretation: <1.8 ft/sec, indicate of risk for recurrent falls General Gait Details: pt is widening and controlling his posture to decrease sway, but has extra time needed to turn walker and is unsteady with tendency to push the pole away from him  Stairs            Wheelchair Mobility    Modified Rankin (Stroke Patients Only)       Balance Overall balance assessment: Needs assistance;History of Falls   Sitting balance-Leahy Scale: Fair     Standing balance support: Bilateral upper extremity supported;During functional activity Standing balance-Leahy Scale: Poor                               Pertinent Vitals/Pain Pain Assessment: No/denies pain    Home Living Family/patient expects to be discharged to:: Private residence Living Arrangements: Alone Available Help at Discharge: Family;Available PRN/intermittently Type of Home: Apartment Home Access: Elevator;Level entry     Home Layout: One level Home Equipment: Cane - single point      Prior Function Level of Independence: Independent with assistive device(s)               Hand Dominance   Dominant Hand: Right  Extremity/Trunk Assessment   Upper Extremity Assessment Upper Extremity Assessment: Generalized weakness    Lower Extremity Assessment Lower Extremity Assessment: Generalized weakness    Cervical / Trunk Assessment Cervical / Trunk Assessment: Kyphotic  Communication   Communication: No difficulties  Cognition Arousal/Alertness: Awake/alert Behavior During Therapy: Impulsive Overall Cognitive Status: No  family/caregiver present to determine baseline cognitive functioning                                 General Comments: pt was able to demonstrate transfers with help but attempts to do alone.      General Comments General comments (skin integrity, edema, etc.): pt was seen for mobility of gait and transfers, but during gait had episode of watery stool with no warning, requiring nursing to help PT get him cleaned and rebandaged for sacral wounds    Exercises     Assessment/Plan    PT Assessment Patient needs continued PT services  PT Problem List Decreased strength;Decreased range of motion;Decreased activity tolerance;Decreased balance;Decreased mobility;Decreased coordination;Decreased knowledge of use of DME;Decreased safety awareness;Decreased knowledge of precautions;Cardiopulmonary status limiting activity;Decreased skin integrity;Decreased cognition       PT Treatment Interventions DME instruction;Gait training;Functional mobility training;Therapeutic activities;Therapeutic exercise;Balance training;Neuromuscular re-education;Patient/family education    PT Goals (Current goals can be found in the Care Plan section)  Acute Rehab PT Goals Patient Stated Goal: to get to rehab and get stronger PT Goal Formulation: With patient Time For Goal Achievement: 07/02/18 Potential to Achieve Goals: Good    Frequency Min 2X/week   Barriers to discharge Decreased caregiver support home alone with a higher fall risk    Co-evaluation               AM-PAC PT "6 Clicks" Mobility  Outcome Measure Help needed turning from your back to your side while in a flat bed without using bedrails?: A Little Help needed moving from lying on your back to sitting on the side of a flat bed without using bedrails?: A Little Help needed moving to and from a bed to a chair (including a wheelchair)?: A Lot Help needed standing up from a chair using your arms (e.g., wheelchair or bedside  chair)?: A Lot Help needed to walk in hospital room?: A Little Help needed climbing 3-5 steps with a railing? : Total 6 Click Score: 14    End of Session Equipment Utilized During Treatment: Gait belt Activity Tolerance: Patient limited by fatigue;Treatment limited secondary to medical complications (Comment) Patient left: in bed;with call bell/phone within reach;with bed alarm set Nurse Communication: Mobility status PT Visit Diagnosis: Unsteadiness on feet (R26.81);Muscle weakness (generalized) (M62.81);Repeated falls (R29.6);Difficulty in walking, not elsewhere classified (R26.2);Adult, failure to thrive (R62.7)    Time: 1165-7903 PT Time Calculation (min) (ACUTE ONLY): 28 min   Charges:   PT Evaluation $PT Eval Moderate Complexity: 1 Mod PT Treatments $Gait Training: 8-22 mins       Ivar Drape 06/18/2018, 4:25 PM  Samul Dada, PT MS Acute Rehab Dept. Number: [facility] [phone] and [facility] [phone]

## 2018-06-18 NOTE — Progress Notes (Signed)
[employer] [facility] [facility] [place] [facility]     PATIENT NAME: Joseph Luna    MR#:  [id number]  DATE OF BIRTH:  10-Jun-1950  SUBJECTIVE:   Patient states he is doing well this morning.  He is glad to be out of the ICU.  He states his breathing is just fine.  He did have a fever to 100.8 after yesterday at 10 AM.  He has been afebrile since then.  He feels like his cough is greatly improved.  He would like to get up with physical therapy today. REVIEW OF SYSTEMS:  General: No fevers, no chills HEENT: No blurred vision or double vision Respiratory: + Cough no shortness of breath Cardiac: No chest pain or palpitations Abdomen: No nausea or vomiting Genitourinary: No dysuria or urinary frequency Skin: No rashes or lesions Neuro: No headaches or dizziness Psych: No depression or anxiety   DRUG ALLERGIES:   Allergies  Allergen Reactions  . Heparin     VITALS:  Blood pressure 121/64, pulse 94, temperature 98.5 F (36.9 C), temperature source Oral, resp. rate 18, height 6' (1.829 m), weight 67.2 kg, SpO2 99 %.  PHYSICAL EXAMINATION:   Physical Exam GENERAL:  68 y.o.-year-old patient lying in bed, alert and talkative.  In no acute distress. EYES: Pupils equal, round, reactive to light. No scleral icterus. HEENT: Head atraumatic, normocephalic.  No scleral icterus.  EOMI.  Moist mucous membranes NECK:  Supple, no jugular venous distention. No thyroid enlargement, no tenderness.  LUNGS: Lungs CTAB,  no wheezing, rales, rhonchi. No use of accessory muscles of respiration.  CARDIOVASCULAR: RRR, S1, S2 normal. No murmurs, rubs, or gallops.  ABDOMEN: Soft, nontender, nondistended. Bowel sounds present. No organomegaly or mass.  EXTREMITIES: No cyanosis, clubbing or edema b/l.    NEUROLOGIC: CN II through XII grossly intact, no focal deficits,+ global weakness, sensation intact throughout PSYCHIATRIC: Alert and oriented x3, normal thought content SKIN: No obvious rash, lesion, or  ulcer.   LABORATORY PANEL:   CBC Recent Labs  Lab 06/18/18 0400  WBC 7.6  HGB 12.4*  HCT 38.1*  PLT 79*   ------------------------------------------------------------------------------------------------------------------  Chemistries  Recent Labs  Lab 06/15/18 0538  06/18/18 0400 06/18/18 0958  NA 152*   < > 147*  --   K 3.9   < > 3.4*  --   CL 123*   < > 117*  --   CO2 24   < > 20*  --   GLUCOSE 320*   < > 278*  --   BUN 29*   < > 9  --   CREATININE 1.34*   < > 0.86  --   CALCIUM 7.1*   < > 7.6*  --   MG  --    < >  --  2.1  AST 66*  --   --   --   ALT 24  --   --   --   ALKPHOS 44  --   --   --   BILITOT 0.7  --   --   --    < > = values in this interval not displayed.   ------------------------------------------------------------------------------------------------------------------  Cardiac Enzymes Recent Labs  Lab 06/14/18 0855  TROPONINI 4.78*   ------------------------------------------------------------------------------------------------------------------  RADIOLOGY:  Dg Chest 2 View  Result Date: 06/18/2018 CLINICAL DATA:  Acute fever. Current smoker. EXAM: CHEST - 2 VIEW COMPARISON:  06/14/2018, 06/13/2018. FINDINGS: AP ERECT and LATERAL images were obtained. Interval extubation. RIGHT jugular central venous catheter  tip projects over the mid SVC, unchanged. Cardiac silhouette normal in size. Thoracic aorta atherosclerotic. Hilar and mediastinal contours otherwise unremarkable. Prominent bronchovascular markings diffusely and moderate central peribronchial thickening, similar to the recent priors. No confluent airspace consolidation. Small to moderate-sized BILATERAL pleural effusions layering posteriorly, best seen on the LATERAL image. Degenerative changes involving the thoracic spine. IMPRESSION: 1. Small to moderate-sized BILATERAL pleural effusions. 2. Stable moderate changes of bronchitis and/or asthma. No acute cardiopulmonary disease otherwise.  Electronically Signed   By: Hulan Saas M.D.   On: 06/18/2018 09:26     ASSESSMENT AND PLAN:   68 yo male w/ hx of hypothyroidism who was found unresponsive and noted to be encephalopathic.  Altered mental status/encephalopathy- resolved.  Patient now stable off Precedex drip.  At mental status baseline.  Acute DKA- resolved. Patient apparently was not on diabetic meds prior to coming to the hospital.  Blood sugars remain elevated. -Will place patient on Novolog 70/30 21 units twice daily, as this is what patient will go home on -Continue SSI  Hypokalemia- K 3.4 -Replete and recheck  Elevated troponin- likely demand ischemia. Troponin has trended up as high as 4. -Cardiology consulted- discontinued argatroban, plan to defer invasive cardiac evaluation at this time -Continue aspirin and beta blocker  Congestive heart failure with mid-range EF- ECHO with EF 40-45%. -Continue Coreg and aspirin  Aspiration pneumonia- patient is stable on room air.  He did have a temperature to 100.31F yesterday morning. -Will obtain chest x-ray this morning -Restart Augmentin for a total 7-day course (Unasyn stopped yesterday)  Hypotension-likely secondary to hypovolemic shock from DKA and dehydration. -Stable off levophed  GERD-continue Protonix.  Awaiting PT consult.  Anticipate that patient will need to go to SNF.  Social work consulted.  All the records are reviewed and case discussed with Care Management/Social Worker. Management plans discussed with the patient, family and they are in agreement.  CODE STATUS: Full code   TOTAL TIME TAKING CARE OF THIS PATIENT: 40 minutes.   POSSIBLE D/C 1-2 days, DEPENDING ON CLINICAL CONDITION and progress.   Jinny Blossom Tobie Hellen M.D on 06/18/2018 at 10:57 AM  Between 7am to 6pm - Pager - [phone]  After 6pm go to [email or web address] - Social research officer, government  [facility]  Office  [phone]  CC: Primary care physician;  Patient, No Pcp Per

## 2018-06-18 NOTE — Consult Note (Addendum)
WOC Nurse wound consult note Patient receiving care in [facility].  No family present.  Patient was found down, unresponsive in his home on 2/27.  The patient tells me "no one really knows how long I was down". Reason for Consult: "pressure ulcer" Wound type: DTPI in a butterfly shape over this coccyx and bilateral buttocks extending downward toward his anus. Pressure Injury POA: Yes Measurement: 19 x 25.  Within this area he also has fungal rash.  Today there were two foam dressings covering the area.  Some of the superficial skin has peeled off, 90% of the area is non-blanchable and maroon.  There is an erythematous periwound border that does blanche.  All of the area is impacted by fungus. Dressing procedure/placement/frequency:  The ordered treatment was performed by me at the time of my visit.   Peel down the sacral foam dressings.  Sprinkle antifungal powder (green and white bottle in clean utility) over all reddened areas. Replace the foam into position.  The foam can remain in place up to 3 days. Monitor the wound area(s) for worsening of condition such as: Signs/symptoms of infection,  Increase in size,  Development of or worsening of odor, Development of pain, or increased pain at the affected locations.  Notify the medical team if any of these develop.  Thank you for the consult.  Discussed plan of care with the patient and bedside nurse.  WOC nurse will not follow at this time.  Please re-consult the WOC team if needed.  Helmut Muster, RN, MSN, CWOCN, CNS-BC, pager [phone]

## 2018-06-18 NOTE — Progress Notes (Signed)
Pharmacy Electrolyte Monitoring Consult:  Pharmacy consulted to assist in monitoring and replacing electrolytes in this 68 y.o. male admitted on 06/13/2018 with Respiratory Distress  Labs:  Sodium (mmol/L)  Date Value  06/18/2018 147 (H)   Potassium (mmol/L)  Date Value  06/18/2018 3.4 (L)   Magnesium (mg/dL)  Date Value  16/01/9603 2.1   Phosphorus (mg/dL)  Date Value  54/12/8117 3.3   Calcium (mg/dL)  Date Value  14/78/2956 7.6 (L)   Albumin (g/dL)  Date Value  21/30/8657 2.4 (L)   Corrected calcium: 8.9  Assessment/Plan: Patient ordered potassium PO x 1 dose.  Patient's magnesium and phosphorus are WNL. No correction needed.   Continue to monitor sodium.   Patient has been transitioned to the floor. Will continue to replace electrolytes to keep within normal limits.   Will obtain BMP with am labs. Will monitor magnesium/phosphorus as clinically indicated.   Pharmacy will continue to monitor and adjust per consult.   Cristyn Crossno A Jamal Haskin 06/18/2018 11:07 AM

## 2018-06-18 NOTE — Progress Notes (Addendum)
Inpatient Diabetes Program Recommendations  AACE/ADA: New Consensus Statement on Inpatient Glycemic Control   Target Ranges:  Prepandial:   less than 140 mg/dL      [facility] postprandial:   less than 180 mg/dL (1-2 hours)      Critically ill patients:  140 - 180 mg/dL  Results for BOW, ADER (MRN [id number]) as of 06/18/2018 07:52  Ref. Range 06/18/2018 04:00  Glucose Latest Ref Range: 70 - 99 mg/dL 881 (H)   Results for MARCELLA, BRASSARD (MRN [id number]) as of 06/18/2018 07:52  Ref. Range 06/17/2018 08:00 06/17/2018 11:44 06/17/2018 16:54 06/17/2018 21:04  Glucose-Capillary Latest Ref Range: 70 - 99 mg/dL 592 (H) 924 (H) 462 (H) 212 (H)  Results for ANIRUDH, GIVINS (MRN [id number]) as of 06/18/2018 07:52  Ref. Range 06/13/2018 20:32  Hemoglobin A1C Latest Ref Range: 4.8 - 5.6 % >15.5 (H)   Review of Glycemic Control  Diabetes history: NO Outpatient Diabetes medications: NA Current orders for Inpatient glycemic control: Lantus 20 units daily, Novolog 0-15 units Q TID with meals, Novolog 0-5 units QHS, Novolog 5 units TID with meals for meal coverage  Inpatient Diabetes Program Recommendations:   Insulin- Since patient will need an affordable insulin as an outpatient, recommend ordering 70/30 starting this morning to determine impact on glucose trends while inpatient. Please consider discontinuing Lantus and Novolog meal coverage and ordering 70/30 21 units BID (dose will provide a total of 29.4 units for basal and 12.6 units for meal coverage).  HgbA1C: A1C greater than 15.5% on 06/13/18 indicating an average glucose over 398 mg/dl over the past 2-3 months. Patient will require insulin for outpatient DM control and will need an affordable insulin.  Addendum 06/18/18@14 :40-Spoke with patient about new diabetes diagnosis. Patient is alert, appropriate, and engaged in conversation appropriately. Patient more focused on discussion today. Reviewed DM education from yesterday and patient was able to answer questions about  information that was discussed yesterday. Reviewed glucose and A1C goals.  Reviewed signs and symptoms of hyperglycemia and hypoglycemia along with treatment for both. Encouraged patient to get glucose tablets to keep on hand (in the car, in pocket, or at bedside) in case he experiences hypoglycemia. Discussed impact of nutrition, exercise, stress, sickness, and medications on diabetes control.  Patient is willing to check glucose 3-4 times per day and to take insulin as an outpatient. Patient again states that he will need affordable insulin as he has no prescription coverage with Medicare. Discussed Novolin 70/30 insulin and reminded patient Novolin 70/30 can be purchased at [facility] for $25 per vial. Provided handout on Reli-On products at [facility]. Informed patient he could purchase Reli-On Prime glucometer at [facility] for $9 and a box of 50 test strips for $9.  Patient can also purchase insulin syringes at [facility] for $13 for box of 100 syringes. Patient states that he would be able to afford insulin, glucometer, testing supplies, and syringes from [facility].  Explained that 70/30 insulin is typically taken BID (with breakfast and supper); stressed to patient he needed to be sure to eat when taking 70/30 insulin.  Patient states that his son is working on getting him a PCP for follow up.  Stressed importance of arranging follow up and explained that the doctor he follows up with will use glucose trends to determine if adjustments will be required with insulin dosages. Reviewed and demonstrated how to draw up and administer insulin with vial and syringe. Patient was able to successfully demonstrate how to draw up and administer insulin with  vial and syringe. Informed patient that RN will be asking him to self-administer insulin to ensure proper technique and ability to administer self insulin shots.  Patient states that he has already given himself one injection and he feels comfortable with self-injecting insulin.   Patient verbalized understanding of information discussed and he states that he has no further questions at this time related to diabetes.   RNs to provide ongoing basic DM education at bedside with this patient and engage patient to actively check blood glucose and administer insulin injections.   At time of discharge, patient will need to purchase Novolin 70/30 insulin, insulin syringes, glucometer, lancets, test strips, and glucose tablets.   Thanks, Orlando Penner, RN, MSN, CDE Diabetes Coordinator Inpatient Diabetes Program [phone] (Team Pager from 8am to 5pm)

## 2018-06-18 NOTE — [place] FL2 (Signed)
[place] MEDICAID FL2 LEVEL OF CARE SCREENING TOOL     IDENTIFICATION  Patient Name: Joseph Luna Birthdate: [birth year] Sex: male Admission Date (Current Location): 06/13/2018  [place] and [place] Number:  Chiropodist and Address:  [facility], [address] [place], [place] [address]      Provider Number: [id number]  Attending Physician Name and Address:  Campbell Stall, MD  Relative Name and Phone Number:       Current Level of Care: Hospital Recommended Level of Care: Skilled Nursing Facility Prior Approval Number:    Date Approved/Denied:   PASRR Number: [id number])  Discharge Plan: SNF    Current Diagnoses: Patient Active Problem List   Diagnosis Date Noted  . Protein-calorie malnutrition, severe 06/15/2018  . Pressure injury of skin 06/15/2018  . Acute respiratory failure (HCC) 06/13/2018    Orientation RESPIRATION BLADDER Height & Weight     Self, Time, Situation, Place  Normal Incontinent Weight: 148 lb 2.4 oz (67.2 kg) Height:  6' (182.9 cm)  BEHAVIORAL SYMPTOMS/MOOD NEUROLOGICAL BOWEL NUTRITION STATUS      Incontinent Diet(Regular diet w/Thin liquids-NO STRAWS)  AMBULATORY STATUS COMMUNICATION OF NEEDS Skin   Extensive Assist Verbally PU Stage and Appropriate Care(Deep Tissue Injury on Mid Sacrum. )                       Personal Care Assistance Level of Assistance  Bathing, Feeding, Dressing Bathing Assistance: Limited assistance Feeding assistance: Independent Dressing Assistance: Limited assistance     Functional Limitations Info  Sight, Hearing, Speech Sight Info: Adequate Hearing Info: Adequate Speech Info: Adequate    SPECIAL CARE FACTORS FREQUENCY  PT (By licensed PT), OT (By licensed OT)     PT Frequency: (5) OT Frequency: (5)            Contractures      Additional Factors Info  Code Status, Allergies Code Status Info: (Full Code. ) Allergies Info: (Heparin)            Current Medications (06/18/2018):  This is the current hospital active medication list Current Facility-Administered Medications  Medication Dose Route Frequency Provider Last Rate Last Dose  . 0.9 %  sodium chloride infusion  250 mL Intravenous PRN Tukov-Yual, Alroy Bailiff, NP   Stopped at 06/17/18 1047  . acetaminophen (TYLENOL) tablet 650 mg  650 mg Oral Q6H PRN Tukov-Yual, Magdalene S, NP      . aspirin EC tablet 81 mg  81 mg Oral Daily Paraschos, Alexander, MD   81 mg at 06/18/18 0951  . chlorhexidine (PERIDEX) 0.12 % solution 15 mL  15 mL Mouth Rinse BID Erin Fulling, MD   15 mL at 06/17/18 2204  . famotidine (PEPCID) tablet 20 mg  20 mg Oral BID Bertram Savin, RPH   20 mg at 06/18/18 0950  . feeding supplement (NEPRO CARB STEADY) liquid 237 mL  237 mL Oral BID BM Mayo, Allyn Kenner, MD   237 mL at 06/18/18 0950  . insulin aspart (novoLOG) injection 0-15 Units  0-15 Units Subcutaneous TID WC Mayo, Allyn Kenner, MD   2 Units at 06/18/18 0827  . insulin aspart (novoLOG) injection 0-5 Units  0-5 Units Subcutaneous QHS Campbell Stall, MD   2 Units at 06/17/18 2205  . insulin aspart protamine- aspart (NOVOLOG MIX 70/30) injection 21 Units  21 Units Subcutaneous BID WC Mayo, Allyn Kenner, MD   21 Units at 06/18/18 0827  . ipratropium-albuterol (DUONEB)  0.5-2.5 (3) MG/3ML nebulizer solution 3 mL  3 mL Nebulization Q4H PRN Harlon Ditty D, NP      . MEDLINE mouth rinse  15 mL Mouth Rinse q12n4p Erin Fulling, MD   15 mL at 06/17/18 1742  . metoprolol succinate (TOPROL-XL) 24 hr tablet 50 mg  50 mg Oral Daily Paraschos, Alexander, MD   50 mg at 06/18/18 0950  . multivitamin with minerals tablet 1 tablet  1 tablet Oral Daily Mayo, Allyn Kenner, MD   1 tablet at 06/18/18 0950  . ondansetron (ZOFRAN) injection 4 mg  4 mg Intravenous Q6H PRN Erin Fulling, MD      . sodium chloride flush (NS) 0.9 % injection 10-40 mL  10-40 mL Intracatheter Q12H Harlon Ditty D, NP   20 mL at 06/17/18 2206  . sodium chloride  flush (NS) 0.9 % injection 10-40 mL  10-40 mL Intracatheter PRN Harlon Ditty D, NP      . sodium chloride flush (NS) 0.9 % injection 3 mL  3 mL Intravenous Q12H Tukov-Yual, Magdalene S, NP   3 mL at 06/17/18 2208  . sodium chloride flush (NS) 0.9 % injection 3 mL  3 mL Intravenous PRN Tukov-Yual, Magdalene S, NP      . traZODone (DESYREL) tablet 100 mg  100 mg Oral QHS Tukov-Yual, Magdalene S, NP         Discharge Medications: Please see discharge summary for a list of discharge medications.  Relevant Imaging Results:  Relevant Lab Results:   Additional Information (SSN: [id number])  Jadalynn Burr, Darleen Crocker, LCSW

## 2018-06-18 NOTE — Care Management Important Message (Signed)
Important Message  Patient Details  Name: Joseph Luna MRN: [id number] Date of Birth: 1950-07-12   Medicare Important Message Given:  Yes    Olegario Messier A Markos Theil 06/18/2018, 10:44 AM

## 2018-06-18 NOTE — Plan of Care (Signed)
  RD consulted for nutrition education regarding diabetes.   Lab Results  Component Value Date   HGBA1C >15.5 (H) 06/13/2018   Met with patient at bedside. He reports his appetite is starting to improve now. He is unable to describe what he had to eat so far today or his typical intake. Patient kept interrupting education to ask when he can get out of bed and when he is leaving the hospital. Difficult to keep patient focused on education.  RD provided "Ready, Set, Start Counting" handout from the Academy of Nutrition and Dietetics. Discussed different food groups and their effects on blood sugar, emphasizing carbohydrate-containing foods. Provided list of carbohydrates and recommended serving sizes of common foods.  Discussed importance of controlled and consistent carbohydrate intake throughout the day. Provided examples of ways to balance meals/snacks and encouraged intake of high-fiber, whole grain complex carbohydrates. Teach back method used.  Expect poor to fair compliance.  Body mass index is 20.09 kg/m. Pt meets criteria for normal weight based on current BMI.  Current diet order is heart healthy/carbohydrate modified, patient is consuming approximately 75-100% of meals at this time. Labs and medications reviewed. Patient continues to be followed by RD in setting of malnutrition.  Willey Blade, MS, [place], LDN Office: [phone] Pager: [phone] After Hours/Weekend Pager: [phone]

## 2018-06-18 NOTE — Clinical Social Work Note (Signed)
Clinical Social Work Assessment  Patient Details  Name: Joseph Luna MRN: [id number] Date of Birth: 09-Jun-1950  Date of referral:  06/18/18               Reason for consult:  Facility Placement                Permission sought to share information with:  Oceanographer granted to share information::  Yes, Verbal Permission Granted  Name::      Skilled Nursing Facility   Agency::   [place]   Relationship::     Contact Information:     Housing/Transportation Living arrangements for the past 2 months:  Single Family Home Source of Information:  Patient Patient Interpreter Needed:  None Criminal Activity/Legal Involvement Pertinent to Current Situation/Hospitalization:  No - Comment as needed Significant Relationships:  Adult Children Lives with:  Self Do you feel safe going back to the place where you live?  Yes Need for family participation in patient care:  Yes (Comment)  Care giving concerns:  Patient lives alone in [place].    Social Worker assessment / plan:  Visual merchandiser (CSW) received verbal consult from RN case manager that patient is requesting to go to SNF for short term rehab. PT is pending. Per patient he lives alone in [place] and feels weak and wants to go to [facility] for rehab. CSW explained that PT will evaluate him and that medicare requires a 3 night qualifying inpatient hospital stay in order to pay for SNF. Patient was admitted to inpatient 2/27. Patient verbalized his understanding and is agreeable to SNF search in [place]. FL2 complete and faxed out. CSW presented bed offers to patient and he chose [facility]. [facility] liaison is aware of accepted bed offer. CSW will continue to follow and assist as needed.   Employment status:  Disabled (Comment on whether or not currently receiving Disability), Retired Health and safety inspector:  Medicare PT Recommendations:  Not assessed at this time Information / Referral to  community resources:  Skilled Nursing Facility  Patient/Family's Response to care:  Patient chose [facility].   Patient/Family's Understanding of and Emotional Response to Diagnosis, Current Treatment, and Prognosis:  Patient was very pleasant and thanked CSW for assistance.   Emotional Assessment Appearance:  Appears stated age Attitude/Demeanor/Rapport:    Affect (typically observed):  Accepting, Adaptable, Pleasant Orientation:  Oriented to Self, Oriented to Place, Oriented to  Time, Oriented to Situation Alcohol / Substance use:  Not Applicable Psych involvement (Current and /or in the community):  No (Comment)  Discharge Needs  Concerns to be addressed:  Discharge Planning Concerns Readmission within the last 30 days:  No Current discharge risk:  Dependent with Mobility Barriers to Discharge:  Continued Medical Work up   [employer], Darleen Crocker, LCSW 06/18/2018, 11:54 AM

## 2018-06-18 NOTE — Clinical Social Work Placement (Signed)
   CLINICAL SOCIAL WORK PLACEMENT  NOTE  Date:  06/18/2018  Patient Details  Name: Joseph Luna MRN: [id number] Date of Birth: Jun 02, 1950  Clinical Social Work is seeking post-discharge placement for this patient at the Skilled  Nursing Facility level of care (*CSW will initial, date and re-position this form in  chart as items are completed):  Yes   Patient/family provided with [facility] Clinical Social Work Department's list of facilities offering this level of care within the geographic area requested by the patient (or if unable, by the patient's family).  Yes   Patient/family informed of their freedom to choose among providers that offer the needed level of care, that participate in Medicare, Medicaid or managed care program needed by the patient, have an available bed and are willing to accept the patient.  Yes   Patient/family informed of 's ownership interest in [facility] and [facility], as well as of the fact that they are under no obligation to receive care at these facilities.  PASRR submitted to EDS on 06/18/18     PASRR number received on 06/18/18     Existing PASRR number confirmed on       FL2 transmitted to all facilities in geographic area requested by pt/family on 06/18/18     FL2 transmitted to all facilities within larger geographic area on       Patient informed that his/her managed care company has contracts with or will negotiate with certain facilities, including the following:        Yes   Patient/family informed of bed offers received.  Patient chooses bed at ([facility] )     Physician recommends and patient chooses bed at      Patient to be transferred to   on  .  Patient to be transferred to facility by       Patient family notified on   of transfer.  Name of family member notified:        PHYSICIAN       Additional Comment:    _______________________________________________ Will Heinkel, Darleen Crocker, LCSW 06/18/2018, 11:54 AM

## 2018-06-19 DIAGNOSIS — Z7401 Bed confinement status: Secondary | ICD-10-CM | POA: Diagnosis not present

## 2018-06-19 DIAGNOSIS — I5022 Chronic systolic (congestive) heart failure: Secondary | ICD-10-CM | POA: Diagnosis not present

## 2018-06-19 DIAGNOSIS — E039 Hypothyroidism, unspecified: Secondary | ICD-10-CM | POA: Diagnosis not present

## 2018-06-19 DIAGNOSIS — E870 Hyperosmolality and hypernatremia: Secondary | ICD-10-CM | POA: Diagnosis not present

## 2018-06-19 DIAGNOSIS — J690 Pneumonitis due to inhalation of food and vomit: Secondary | ICD-10-CM | POA: Diagnosis not present

## 2018-06-19 DIAGNOSIS — J99 Respiratory disorders in diseases classified elsewhere: Secondary | ICD-10-CM | POA: Diagnosis not present

## 2018-06-19 DIAGNOSIS — J9600 Acute respiratory failure, unspecified whether with hypoxia or hypercapnia: Secondary | ICD-10-CM | POA: Diagnosis not present

## 2018-06-19 DIAGNOSIS — I10 Essential (primary) hypertension: Secondary | ICD-10-CM | POA: Diagnosis not present

## 2018-06-19 DIAGNOSIS — R739 Hyperglycemia, unspecified: Secondary | ICD-10-CM | POA: Diagnosis not present

## 2018-06-19 DIAGNOSIS — R7989 Other specified abnormal findings of blood chemistry: Secondary | ICD-10-CM | POA: Diagnosis not present

## 2018-06-19 DIAGNOSIS — R0603 Acute respiratory distress: Secondary | ICD-10-CM | POA: Diagnosis not present

## 2018-06-19 DIAGNOSIS — E7841 Elevated Lipoprotein(a): Secondary | ICD-10-CM | POA: Diagnosis not present

## 2018-06-19 DIAGNOSIS — E1165 Type 2 diabetes mellitus with hyperglycemia: Secondary | ICD-10-CM | POA: Diagnosis not present

## 2018-06-19 DIAGNOSIS — G9340 Encephalopathy, unspecified: Secondary | ICD-10-CM | POA: Diagnosis not present

## 2018-06-19 DIAGNOSIS — M6281 Muscle weakness (generalized): Secondary | ICD-10-CM | POA: Diagnosis not present

## 2018-06-19 DIAGNOSIS — I509 Heart failure, unspecified: Secondary | ICD-10-CM | POA: Diagnosis not present

## 2018-06-19 DIAGNOSIS — M2550 Pain in unspecified joint: Secondary | ICD-10-CM | POA: Diagnosis not present

## 2018-06-19 DIAGNOSIS — E1110 Type 2 diabetes mellitus with ketoacidosis without coma: Secondary | ICD-10-CM | POA: Diagnosis not present

## 2018-06-19 DIAGNOSIS — E43 Unspecified severe protein-calorie malnutrition: Secondary | ICD-10-CM | POA: Diagnosis not present

## 2018-06-19 LAB — CBC
HCT: 34.4 % — ABNORMAL LOW (ref 39.0–52.0)
Hemoglobin: 10.7 g/dL — ABNORMAL LOW (ref 13.0–17.0)
MCH: 30.8 pg (ref 26.0–34.0)
MCHC: 31.1 g/dL (ref 30.0–36.0)
MCV: 99.1 fL (ref 80.0–100.0)
Platelets: 113 10*3/uL — ABNORMAL LOW (ref 150–400)
RBC: 3.47 MIL/uL — ABNORMAL LOW (ref 4.22–5.81)
RDW: 13.3 % (ref 11.5–15.5)
WBC: 6.9 10*3/uL (ref 4.0–10.5)
nRBC: 0 % (ref 0.0–0.2)

## 2018-06-19 LAB — BASIC METABOLIC PANEL
Anion gap: 8 (ref 5–15)
BUN: 10 mg/dL (ref 8–23)
CHLORIDE: 118 mmol/L — AB (ref 98–111)
CO2: 21 mmol/L — ABNORMAL LOW (ref 22–32)
Calcium: 8.1 mg/dL — ABNORMAL LOW (ref 8.9–10.3)
Creatinine, Ser: 0.76 mg/dL (ref 0.61–1.24)
GFR calc Af Amer: >60 mL/min (ref >60)
GFR calc non Af Amer: >60 mL/min (ref >60)
Glucose, Bld: 175 mg/dL — ABNORMAL HIGH (ref 70–99)
Potassium: 3.6 mmol/L (ref 3.5–5.1)
Sodium: 147 mmol/L — ABNORMAL HIGH (ref 135–145)

## 2018-06-19 LAB — PHOSPHORUS: Phosphorus: 4 mg/dL (ref 2.5–4.6)

## 2018-06-19 LAB — GLUCOSE, CAPILLARY
Glucose-Capillary: 229 mg/dL — ABNORMAL HIGH (ref 70–99)
Glucose-Capillary: 223 mg/dL — ABNORMAL HIGH (ref 70–99)
Glucose-Capillary: 231 mg/dL — ABNORMAL HIGH (ref 70–99)

## 2018-06-19 LAB — MAGNESIUM: Magnesium: 2.1 mg/dL (ref 1.7–2.4)

## 2018-06-19 MED ORDER — ASPIRIN 81 MG PO TBEC: 81.0000 mg | DELAYED_RELEASE_TABLET | Freq: Every day | ORAL | 0 refills | Status: AC

## 2018-06-19 MED ORDER — METOPROLOL SUCCINATE ER 50 MG PO TB24: 50.0000 mg | ORAL_TABLET | Freq: Every day | ORAL | 0 refills | Status: DC

## 2018-06-19 MED ORDER — AMOXICILLIN-POT CLAVULANATE 875-125 MG PO TABS: 1.0000 | ORAL_TABLET | Freq: Two times a day (BID) | ORAL | 0 refills | Status: DC

## 2018-06-19 MED ORDER — INSULIN ASPART PROT & ASPART (70-30 MIX) 100 UNIT/ML ~~LOC~~ SUSP: 21.0000 [IU] | Freq: Two times a day (BID) | SUBCUTANEOUS | 0 refills | Status: DC

## 2018-06-19 MED ORDER — INSULIN ISOPHANE & REGULAR (HUMAN 70-30)100 UNIT/ML KWIKPEN: 21.0000 [IU] | PEN_INJECTOR | Freq: Two times a day (BID) | SUBCUTANEOUS | 0 refills | Status: DC

## 2018-06-19 MED ORDER — NEPRO/CARBSTEADY PO LIQD: 237.0000 mL | Freq: Two times a day (BID) | ORAL | 0 refills | Status: AC

## 2018-06-19 NOTE — Progress Notes (Signed)
  Speech Language Pathology Treatment: Dysphagia  Patient Details Name: Joseph Luna MRN: [id number] DOB: February 04, 1951 Today's Date: 06/19/2018 Time: 0825-0850 SLP Time Calculation (min) (ACUTE ONLY): 25 min  Assessment / Plan / Recommendation Clinical Impression  Pt seen for f/u for toleration of diet; solids in diet. At BSE, he only consumed liquids. Yesterday, he consumed trials of french toast, then few trials at breakfast meal today. Pt exhibited no overt s/s of aspiration w/ trials of solids or thin liquids; no decline in vocal quality or respiratory status during/post po's. Oral phase appeared adequate for mastication of solids, bolus management, A-P transfer, and oral clearing of all trials. Pt fed self.  Education given on general aspiration precautions. Recommend continue w/ current diet and general precautions at discharge. Pills can be swallowed w/ a puree if easier for swallowing. Pt agreed. No further skilled ST services indicated at this time. NSG to reconsult if any change in status while admitted.     HPI HPI: Pt is a 68 y.o. male with a known history of hypothyroidism and diabetes(?) with no known previous history of diabetes who was found unresponsive on the floor in his home today.  Last known well was yesterday by family.  Cording to his son when he saw him he was not acting right.  When EMS arrived to his house found him unresponsive and blood sugar was high.  In the emergency room he is noted to have severe hyperglycemia, metabolic acidosis.  To protect his airway he was intubated.  Pt was extubated 2 days later, however, he continued to have agitation during the following day/evening "verbally and physically aggressive, attempting to kick nursing and respiratory staff. Pt was also attempting to elope bed and removed an IV from his arm" receiving Haldol and Precedex.  Pt appears calm this PM w/ Sitter present in room.       SLP Plan  All goals met       Recommendations  Diet  recommendations: Regular;Thin liquid Liquids provided via: Cup;Straw(monitor) Medication Administration: Whole meds with puree(if needed for easier swallowing) Supervision: Patient able to self feed Compensations: Minimize environmental distractions;Slow rate;Small sips/bites Postural Changes and/or Swallow Maneuvers: Out of bed for meals;Seated upright 90 degrees;Upright 30-60 min after meal                General recommendations: (n/a) Oral Care Recommendations: Oral care BID;Patient independent with oral care Follow up Recommendations: None SLP Visit Diagnosis: Dysphagia, unspecified (R13.10) Plan: All goals met       GO                Orinda Kenner, MS, CCC-SLP Nazir Hacker 06/19/2018, 9:54 AM

## 2018-06-19 NOTE — Progress Notes (Signed)
Called report to Till[place] at [facility].   Suzan Slick, RN

## 2018-06-19 NOTE — Clinical Social Work Note (Addendum)
Patient to be d/c'ed today to [facility] room 504.  Patient and family agreeable to plans will transport via ems RN to call report [phone].  Patient will notify his family that he is discharging today.  Windell Moulding, MSW, LCSW [phone]

## 2018-06-19 NOTE — Progress Notes (Signed)
Physical Therapy Treatment Patient Details Name: Joseph Luna MRN: [id number] DOB: [birth year] Today's Date: 06/19/2018    History of Present Illness 68 yo male with onset of aspiration PNA was admitted, noted to have B pleural effusion, bronchitis, metabolic acidosis, intubation early for respiratory failure with hypoxia, elevated troponin from demand, Dka, rhabdomyolysis, hypotension, low K+, and initially unresponsive in acute renal failure.  PMHx:  thoracic DJD, sacral wound with fungal rash,     PT Comments    Pt was seen for further mobility after having a very unstable walking session yesterday.  More organized gait presentation today, but is not able to self limit for distances.  Pt is also distracted with asking about his tennis shoes, wanted to wander on the hallway and find them.  Discussed with him the likely location, and then talked about the pending trip to SNF.  Pt is expecting to go this afternoon, and will see tomorrow if he is not transferred.  Follow for strengthening and balance with greater distances of gait.   Follow Up Recommendations  SNF     Equipment Recommendations  Rolling walker with 5" wheels    Recommendations for Other Services       Precautions / Restrictions Precautions Precautions: Fall(monitor for incontinence) Restrictions Weight Bearing Restrictions: No    Mobility  Bed Mobility Overal bed mobility: Needs Assistance Bed Mobility: Supine to Sit;Sit to Supine     Supine to sit: Min guard Sit to supine: Min guard      Transfers Overall transfer level: Needs assistance Equipment used: Rolling walker (2 wheeled);1 person hand held assist Transfers: Sit to/from Stand Sit to Stand: Min assist         General transfer comment: cues for hand placement and safety  Ambulation/Gait Ambulation/Gait assistance: Min guard Gait Distance (Feet): 80 Feet Assistive device: Rolling walker (2 wheeled);IV Pole Gait Pattern/deviations: Step-through  pattern;Decreased stride length;Wide base of support Gait velocity: reduced Gait velocity interpretation: <1.31 ft/sec, indicative of household ambulator General Gait Details: pt is safer with walker, keeping closer to him and avoiding wide turns, but is giving out a bit without self awareness   Stairs             Wheelchair Mobility    Modified Rankin (Stroke Patients Only)       Balance     Sitting balance-Leahy Scale: Good     Standing balance support: Bilateral upper extremity supported;During functional activity Standing balance-Leahy Scale: Fair                              Cognition Arousal/Alertness: Awake/alert Behavior During Therapy: Impulsive Overall Cognitive Status: No family/caregiver present to determine baseline cognitive functioning                                 General Comments: pt is safer with transfers but talking about things that are not accurate      Exercises      General Comments General comments (skin integrity, edema, etc.): pt is getting up with RW better, PT monitored his use of walker but pt mainly is not setting limits of distance now.  He is asking to go to another part of the unit to get his shoes, perceives that he saw them this AM in the hall during the tornado drill      Pertinent Vitals/Pain Pain Assessment: No/denies pain  Home Living                      Prior Function            PT Goals (current goals can now be found in the care plan section) Acute Rehab PT Goals Patient Stated Goal: to get to rehab and get stronger Progress towards PT goals: Progressing toward goals    Frequency    Min 2X/week      PT Plan Current plan remains appropriate    Co-evaluation              AM-PAC PT "6 Clicks" Mobility   Outcome Measure  Help needed turning from your back to your side while in a flat bed without using bedrails?: A Little Help needed moving from lying on  your back to sitting on the side of a flat bed without using bedrails?: A Little Help needed moving to and from a bed to a chair (including a wheelchair)?: A Little Help needed standing up from a chair using your arms (e.g., wheelchair or bedside chair)?: A Little Help needed to walk in hospital room?: A Little Help needed climbing 3-5 steps with a railing? : A Lot 6 Click Score: 17    End of Session Equipment Utilized During Treatment: Gait belt Activity Tolerance: Patient tolerated treatment well Patient left: in bed;with call bell/phone within reach;with bed alarm set Nurse Communication: Mobility status PT Visit Diagnosis: Unsteadiness on feet (R26.81);Muscle weakness (generalized) (M62.81);Repeated falls (R29.6);Difficulty in walking, not elsewhere classified (R26.2);Adult, failure to thrive (R62.7)     Time: 9326-7124 PT Time Calculation (min) (ACUTE ONLY): 30 min  Charges:  $Gait Training: 8-22 mins $Therapeutic Activity: 8-22 mins                    Ivar Drape 06/19/2018, 4:16 PM  Samul Dada, PT MS Acute Rehab Dept. Number: [facility] [id number] and [facility] [phone]

## 2018-06-19 NOTE — Discharge Summary (Addendum)
[employer] [facility] [place] at [facility]   PATIENT NAME: Trace Wirick    MR#:  [id number]  DATE OF BIRTH:  1950-09-02  DATE OF ADMISSION:  06/13/2018   ADMITTING PHYSICIAN: Auburn Bilberry, MD  DATE OF DISCHARGE: 06/19/18  PRIMARY CARE PHYSICIAN: Patient, No Pcp Per   ADMISSION DIAGNOSIS:  Dehydration [E86.0] Hypernatremia [E87.0] Respiratory distress [R06.03] Hyperglycemia [R73.9] Diabetic ketoacidosis with coma associated with other specified diabetes mellitus (HCC) [E13.11] Glasgow coma scale total score 3-8, unspecified coma timing (HCC) [R40.2430] DISCHARGE DIAGNOSIS:  Active Problems:   Acute respiratory failure (HCC)   Protein-calorie malnutrition, severe   Pressure injury of skin  SECONDARY DIAGNOSIS:   Past Medical History:  Diagnosis Date  . Hypothyroid    HOSPITAL COURSE:   Dillin is a 68 year old male who presented to the ED with an episode of unresponsiveness. In the ED, he was found to have severe hyperglycemia with DKA. He was unable to protect his airway, so he was intubated. He was admitted for further management.  Altered mental status/encephalopathy- resolved.   -Initially on precedex drip, but stable off drip for 72 hours prior to discharge  Acute DKA in type 2 diabetes- resolved. Patient apparently was not on diabetic meds prior to coming to the hospital.  -Patient discharged on Novolin 70/30 21 units twice daily -Check blood sugars 2-3 times daily  Elevated troponin- likely demand ischemia. Troponin has trended up as high as 4. -Cardiology consulted- plan to defer invasive cardiac evaluation at this time -Continued aspirin and beta blocker -Needs to follow-up with cardiology as an outpatient  Congestive heart failure with mid-range EF- ECHO with EF 40-45%. -Continued Coreg and aspirin  Aspiration pneumonia- patient is stable on room air.  -Discharged on augmentin for a total 7 day course (end 06/21/18)  GERD-continued  Protonix.  Hyperthyroidism- TSH low, but T3 and T4 normal this admission -Continued methimazole  daily -Needs thyroid function labs rechecked as an outpatient  DISCHARGE CONDITIONS:  Type 2 diabetes Congestive heart failure with mid-range EF Aspiration pneumonia GERD Hyperthyroidism CONSULTS OBTAINED:  CCM DRUG ALLERGIES:   Allergies  Allergen Reactions  . Heparin    DISCHARGE MEDICATIONS:   Allergies as of 06/19/2018      Reactions   Heparin       Medication List    TAKE these medications   amoxicillin-clavulanate 875-125 MG tablet Commonly known as:  AUGMENTIN Take 1 tablet by mouth every 12 (twelve) hours.   aspirin 81 MG EC tablet Take 1 tablet (81 mg total) by mouth daily. Start taking on:  June 20, 2018   feeding supplement (NEPRO CARB STEADY) Liqd Take 237 mLs by mouth 2 (two) times daily between meals for 30 days.   Insulin Isophane & Regular Human (70-30) 100 UNIT/ML PEN Commonly known as:  NOVOLIN 70/30 FLEXPEN RELION Inject 21 Units into the skin 2 (two) times daily with a meal.   methimazole 10 MG tablet Commonly known as:  TAPAZOLE Take 20 mg by mouth daily.   metoprolol succinate 50 MG 24 hr tablet Commonly known as:  TOPROL-XL Take 1 tablet (50 mg total) by mouth daily. Take with or immediately following a meal. Start taking on:  June 20, 2018            Durable Medical Equipment  (From admission, onward)         Start     Ordered   06/19/18 7846  For home use only DME Walker rolling  (  Walkers)  Once    Question:  Patient needs a walker to treat with the following condition  Answer:  Generalized weakness   06/19/18 0852           DISCHARGE INSTRUCTIONS:  1. Take Augmentin twice daily- take 1 tablet tonight and then continue for 2 more days (ending 06/21/18) 2. Take Novolog 70/30 21 units twice daily 3. Needs thyroid function testing rechecked as an outpatient DIET:  Cardiac diet and Diabetic diet DISCHARGE CONDITION:   Stable ACTIVITY:  Activity as tolerated OXYGEN:  Home Oxygen: No.  Oxygen Delivery: room air DISCHARGE LOCATION:  nursing home   If you experience worsening of your admission symptoms, develop shortness of breath, life threatening emergency, suicidal or homicidal thoughts you must seek medical attention immediately by calling 911 or calling your MD immediately  if symptoms less severe.  You Must read complete instructions/literature along with all the possible adverse reactions/side effects for all the Medicines you take and that have been prescribed to you. Take any new Medicines after you have completely understood and accpet all the possible adverse reactions/side effects.   Please note  You were cared for by a hospitalist during your hospital stay. If you have any questions about your discharge medications or the care you received while you were in the hospital after you are discharged, you can call the unit and asked to speak with the hospitalist on call if the hospitalist that took care of you is not available. Once you are discharged, your primary care physician will handle any further medical issues. Please note that NO REFILLS for any discharge medications will be authorized once you are discharged, as it is imperative that you return to your primary care physician (or establish a relationship with a primary care physician if you do not have one) for your aftercare needs so that they can reassess your need for medications and monitor your lab values.    On the day of Discharge:  VITAL SIGNS:  Blood pressure 106/61, pulse 89, temperature 98 F (36.7 C), temperature source Oral, resp. rate 17, height 6' (1.829 m), weight 67.2 kg, SpO2 97 %. PHYSICAL EXAMINATION:  GENERAL:  68 y.o.-year-old patient lying in bed, alert and talkative.  In no acute distress. EYES: Pupils equal, round, reactive to light. No scleral icterus. HEENT: Head atraumatic, normocephalic.  No scleral icterus.   EOMI.  Moist mucous membranes NECK:  Supple, no jugular venous distention. No thyroid enlargement, no tenderness.  LUNGS: Lungs CTAB,  no wheezing, rales, rhonchi. No use of accessory muscles of respiration.  CARDIOVASCULAR: RRR, S1, S2 normal. No murmurs, rubs, or gallops.  ABDOMEN: Soft, nontender, nondistended. Bowel sounds present. No organomegaly or mass.  EXTREMITIES: No cyanosis, clubbing or edema b/l.    NEUROLOGIC: CN II through XII grossly intact, no focal deficits,+ global weakness, sensation intact throughout PSYCHIATRIC: Alert and oriented x3, normal thought content SKIN: No obvious rash, lesion, or ulcer.  DATA REVIEW:   CBC Recent Labs  Lab 06/19/18 0440  WBC 6.9  HGB 10.7*  HCT 34.4*  PLT 113*    Chemistries  Recent Labs  Lab 06/15/18 0538  06/19/18 0440  NA 152*   < > 147*  K 3.9   < > 3.6  CL 123*   < > 118*  CO2 24   < > 21*  GLUCOSE 320*   < > 175*  BUN 29*   < > 10  CREATININE 1.34*   < > 0.76  CALCIUM 7.1*   < > 8.1*  MG  --    < > 2.1  AST 66*  --   --   ALT 24  --   --   ALKPHOS 44  --   --   BILITOT 0.7  --   --    < > = values in this interval not displayed.     Microbiology Results  Results for orders placed or performed during the hospital encounter of 06/13/18  Urine culture     Status: None   Collection Time: 06/13/18  3:06 PM  Result Value Ref Range Status   Specimen Description   Final    URINE, RANDOM Performed at [facility], [address] [place], [place] [address]    Special Requests   Final    NONE Performed at [facility], [address] [place], [place] [address]    Culture   Final    NO GROWTH Performed at [facility], [address] [place], [place], [place] [address]    Report Status 06/14/2018 FINAL  Final  MRSA PCR Screening     Status: None   Collection Time: 06/13/18  7:58 PM  Result Value Ref Range Status   MRSA by PCR NEGATIVE NEGATIVE Final    Comment:        The GeneXpert MRSA  Assay (FDA approved for NASAL specimens only), is one component of a comprehensive MRSA colonization surveillance program. It is not intended to diagnose MRSA infection nor to guide or monitor treatment for MRSA infections. Performed at [facility], [address] [place], [place] [address]   CULTURE, BLOOD (ROUTINE X 2) w Reflex to ID Panel     Status: None   Collection Time: 06/13/18  8:43 PM  Result Value Ref Range Status   Specimen Description BLOOD BLOOD RIGHT HAND  Final   Special Requests   Final    BOTTLES DRAWN AEROBIC AND ANAEROBIC Blood Culture adequate volume   Culture   Final    NO GROWTH 5 DAYS Performed at [facility], [address] [place], [place] [address]    Report Status 06/18/2018 FINAL  Final  CULTURE, BLOOD (ROUTINE X 2) w Reflex to ID Panel     Status: None   Collection Time: 06/13/18 10:09 PM  Result Value Ref Range Status   Specimen Description BLOOD BLOOD RIGHT FOREARM  Final   Special Requests   Final    BOTTLES DRAWN AEROBIC AND ANAEROBIC Blood Culture adequate volume   Culture   Final    NO GROWTH 5 DAYS Performed at [facility], [address] [place], [place] [address]    Report Status 06/18/2018 FINAL  Final  Culture, respiratory (non-expectorated)     Status: None   Collection Time: 06/14/18  7:45 AM  Result Value Ref Range Status   Specimen Description   Final    TRACHEAL ASPIRATE Performed at [facility], [address], [place], [place] [address]    Special Requests   Final    Normal Performed at [facility], [address] [place], [place] [address]    Gram Stain   Final    RARE WBC PRESENT, PREDOMINANTLY PMN NO ORGANISMS SEEN Performed at [facility], [address] [place], [place] [address]    Culture RARE CANDIDA ALBICANS  Final   Report Status 06/16/2018 FINAL  Final    RADIOLOGY:  No results found.  Management plans discussed with the patient,  family and they are in agreement.  CODE STATUS: Full Code   TOTAL TIME TAKING CARE OF THIS PATIENT: 35 minutes.    Jinny BlossomKaty D Deiondre Harrower M.D on 06/19/2018 at 9:50 AM  Between 7am to 6pm - Pager - [phone]  After 6pm go to [email or web address] - Social research officer, governmentpassword [email or web address] [email or web address]  [facility] [place] [facility]  Office  [phone]  CC: Primary care physician; Patient, No Pcp Per   Note: This dictation was prepared with Dragon dictation along with smaller phrase technology. Any transcriptional errors that result from this process are unintentional.

## 2018-06-19 NOTE — Plan of Care (Signed)
Pt more cooperative this shift. Pt having bowel movements without difficulty. Pt is able to repeat back safety measures for fall risk. Pdowless,rn 06/19/2018 [phone]

## 2018-06-20 DIAGNOSIS — E039 Hypothyroidism, unspecified: Secondary | ICD-10-CM | POA: Diagnosis not present

## 2018-06-20 DIAGNOSIS — E1110 Type 2 diabetes mellitus with ketoacidosis without coma: Secondary | ICD-10-CM | POA: Diagnosis not present

## 2018-06-20 DIAGNOSIS — E7841 Elevated Lipoprotein(a): Secondary | ICD-10-CM | POA: Diagnosis not present

## 2018-06-20 DIAGNOSIS — R739 Hyperglycemia, unspecified: Secondary | ICD-10-CM | POA: Diagnosis not present

## 2018-06-27 DIAGNOSIS — R739 Hyperglycemia, unspecified: Secondary | ICD-10-CM | POA: Diagnosis not present

## 2018-06-27 DIAGNOSIS — E039 Hypothyroidism, unspecified: Secondary | ICD-10-CM | POA: Diagnosis not present

## 2018-06-27 DIAGNOSIS — E7841 Elevated Lipoprotein(a): Secondary | ICD-10-CM | POA: Diagnosis not present

## 2018-06-27 DIAGNOSIS — E1110 Type 2 diabetes mellitus with ketoacidosis without coma: Secondary | ICD-10-CM | POA: Diagnosis not present

## 2018-07-03 DIAGNOSIS — E43 Unspecified severe protein-calorie malnutrition: Secondary | ICD-10-CM | POA: Diagnosis not present

## 2018-07-03 DIAGNOSIS — G4700 Insomnia, unspecified: Secondary | ICD-10-CM | POA: Diagnosis not present

## 2018-07-03 DIAGNOSIS — I130 Hypertensive heart and chronic kidney disease with heart failure and stage 1 through stage 4 chronic kidney disease, or unspecified chronic kidney disease: Secondary | ICD-10-CM | POA: Diagnosis not present

## 2018-07-03 DIAGNOSIS — Z87440 Personal history of urinary (tract) infections: Secondary | ICD-10-CM | POA: Diagnosis not present

## 2018-07-03 DIAGNOSIS — N183 Chronic kidney disease, stage 3 (moderate): Secondary | ICD-10-CM | POA: Diagnosis not present

## 2018-07-03 DIAGNOSIS — E039 Hypothyroidism, unspecified: Secondary | ICD-10-CM | POA: Diagnosis not present

## 2018-07-03 DIAGNOSIS — L89154 Pressure ulcer of sacral region, stage 4: Secondary | ICD-10-CM | POA: Diagnosis not present

## 2018-07-03 DIAGNOSIS — Z7982 Long term (current) use of aspirin: Secondary | ICD-10-CM | POA: Diagnosis not present

## 2018-07-03 DIAGNOSIS — Z9181 History of falling: Secondary | ICD-10-CM | POA: Diagnosis not present

## 2018-07-03 DIAGNOSIS — Z8701 Personal history of pneumonia (recurrent): Secondary | ICD-10-CM | POA: Diagnosis not present

## 2018-07-03 DIAGNOSIS — E789 Disorder of lipoprotein metabolism, unspecified: Secondary | ICD-10-CM | POA: Diagnosis not present

## 2018-07-03 DIAGNOSIS — Z79891 Long term (current) use of opiate analgesic: Secondary | ICD-10-CM | POA: Diagnosis not present

## 2018-07-03 DIAGNOSIS — J449 Chronic obstructive pulmonary disease, unspecified: Secondary | ICD-10-CM | POA: Diagnosis not present

## 2018-07-03 DIAGNOSIS — Z794 Long term (current) use of insulin: Secondary | ICD-10-CM | POA: Diagnosis not present

## 2018-07-03 DIAGNOSIS — M1990 Unspecified osteoarthritis, unspecified site: Secondary | ICD-10-CM | POA: Diagnosis not present

## 2018-07-03 DIAGNOSIS — K219 Gastro-esophageal reflux disease without esophagitis: Secondary | ICD-10-CM | POA: Diagnosis not present

## 2018-07-03 DIAGNOSIS — I509 Heart failure, unspecified: Secondary | ICD-10-CM | POA: Diagnosis not present

## 2018-07-03 DIAGNOSIS — K5900 Constipation, unspecified: Secondary | ICD-10-CM | POA: Diagnosis not present

## 2018-07-03 DIAGNOSIS — E119 Type 2 diabetes mellitus without complications: Secondary | ICD-10-CM | POA: Diagnosis not present

## 2018-07-04 DIAGNOSIS — I130 Hypertensive heart and chronic kidney disease with heart failure and stage 1 through stage 4 chronic kidney disease, or unspecified chronic kidney disease: Secondary | ICD-10-CM | POA: Diagnosis not present

## 2018-07-04 DIAGNOSIS — N183 Chronic kidney disease, stage 3 (moderate): Secondary | ICD-10-CM | POA: Diagnosis not present

## 2018-07-04 DIAGNOSIS — J449 Chronic obstructive pulmonary disease, unspecified: Secondary | ICD-10-CM | POA: Diagnosis not present

## 2018-07-04 DIAGNOSIS — L89154 Pressure ulcer of sacral region, stage 4: Secondary | ICD-10-CM | POA: Diagnosis not present

## 2018-07-04 DIAGNOSIS — E119 Type 2 diabetes mellitus without complications: Secondary | ICD-10-CM | POA: Diagnosis not present

## 2018-07-04 DIAGNOSIS — I509 Heart failure, unspecified: Secondary | ICD-10-CM | POA: Diagnosis not present

## 2018-07-09 DIAGNOSIS — L89154 Pressure ulcer of sacral region, stage 4: Secondary | ICD-10-CM | POA: Diagnosis not present

## 2018-07-09 DIAGNOSIS — I509 Heart failure, unspecified: Secondary | ICD-10-CM | POA: Diagnosis not present

## 2018-07-09 DIAGNOSIS — N183 Chronic kidney disease, stage 3 (moderate): Secondary | ICD-10-CM | POA: Diagnosis not present

## 2018-07-09 DIAGNOSIS — E119 Type 2 diabetes mellitus without complications: Secondary | ICD-10-CM | POA: Diagnosis not present

## 2018-07-09 DIAGNOSIS — I130 Hypertensive heart and chronic kidney disease with heart failure and stage 1 through stage 4 chronic kidney disease, or unspecified chronic kidney disease: Secondary | ICD-10-CM | POA: Diagnosis not present

## 2018-07-09 DIAGNOSIS — J449 Chronic obstructive pulmonary disease, unspecified: Secondary | ICD-10-CM | POA: Diagnosis not present

## 2018-07-11 DIAGNOSIS — J449 Chronic obstructive pulmonary disease, unspecified: Secondary | ICD-10-CM | POA: Diagnosis not present

## 2018-07-11 DIAGNOSIS — N183 Chronic kidney disease, stage 3 (moderate): Secondary | ICD-10-CM | POA: Diagnosis not present

## 2018-07-11 DIAGNOSIS — E119 Type 2 diabetes mellitus without complications: Secondary | ICD-10-CM | POA: Diagnosis not present

## 2018-07-11 DIAGNOSIS — I509 Heart failure, unspecified: Secondary | ICD-10-CM | POA: Diagnosis not present

## 2018-07-11 DIAGNOSIS — I130 Hypertensive heart and chronic kidney disease with heart failure and stage 1 through stage 4 chronic kidney disease, or unspecified chronic kidney disease: Secondary | ICD-10-CM | POA: Diagnosis not present

## 2018-07-11 DIAGNOSIS — L89154 Pressure ulcer of sacral region, stage 4: Secondary | ICD-10-CM | POA: Diagnosis not present

## 2018-07-12 DIAGNOSIS — E119 Type 2 diabetes mellitus without complications: Secondary | ICD-10-CM | POA: Diagnosis not present

## 2018-07-12 DIAGNOSIS — I130 Hypertensive heart and chronic kidney disease with heart failure and stage 1 through stage 4 chronic kidney disease, or unspecified chronic kidney disease: Secondary | ICD-10-CM | POA: Diagnosis not present

## 2018-07-12 DIAGNOSIS — I509 Heart failure, unspecified: Secondary | ICD-10-CM | POA: Diagnosis not present

## 2018-07-12 DIAGNOSIS — L89154 Pressure ulcer of sacral region, stage 4: Secondary | ICD-10-CM | POA: Diagnosis not present

## 2018-07-12 DIAGNOSIS — J449 Chronic obstructive pulmonary disease, unspecified: Secondary | ICD-10-CM | POA: Diagnosis not present

## 2018-07-12 DIAGNOSIS — N183 Chronic kidney disease, stage 3 (moderate): Secondary | ICD-10-CM | POA: Diagnosis not present

## 2018-07-15 DIAGNOSIS — E119 Type 2 diabetes mellitus without complications: Secondary | ICD-10-CM | POA: Diagnosis not present

## 2018-07-15 DIAGNOSIS — N183 Chronic kidney disease, stage 3 (moderate): Secondary | ICD-10-CM | POA: Diagnosis not present

## 2018-07-15 DIAGNOSIS — I509 Heart failure, unspecified: Secondary | ICD-10-CM | POA: Diagnosis not present

## 2018-07-15 DIAGNOSIS — L89154 Pressure ulcer of sacral region, stage 4: Secondary | ICD-10-CM | POA: Diagnosis not present

## 2018-07-15 DIAGNOSIS — I130 Hypertensive heart and chronic kidney disease with heart failure and stage 1 through stage 4 chronic kidney disease, or unspecified chronic kidney disease: Secondary | ICD-10-CM | POA: Diagnosis not present

## 2018-07-15 DIAGNOSIS — J449 Chronic obstructive pulmonary disease, unspecified: Secondary | ICD-10-CM | POA: Diagnosis not present

## 2018-07-16 DIAGNOSIS — I130 Hypertensive heart and chronic kidney disease with heart failure and stage 1 through stage 4 chronic kidney disease, or unspecified chronic kidney disease: Secondary | ICD-10-CM | POA: Diagnosis not present

## 2018-07-16 DIAGNOSIS — L89154 Pressure ulcer of sacral region, stage 4: Secondary | ICD-10-CM | POA: Diagnosis not present

## 2018-07-16 DIAGNOSIS — I509 Heart failure, unspecified: Secondary | ICD-10-CM | POA: Diagnosis not present

## 2018-07-16 DIAGNOSIS — E119 Type 2 diabetes mellitus without complications: Secondary | ICD-10-CM | POA: Diagnosis not present

## 2018-07-16 DIAGNOSIS — N183 Chronic kidney disease, stage 3 (moderate): Secondary | ICD-10-CM | POA: Diagnosis not present

## 2018-07-16 DIAGNOSIS — J449 Chronic obstructive pulmonary disease, unspecified: Secondary | ICD-10-CM | POA: Diagnosis not present

## 2018-07-17 DIAGNOSIS — E119 Type 2 diabetes mellitus without complications: Secondary | ICD-10-CM | POA: Diagnosis not present

## 2018-07-17 DIAGNOSIS — I130 Hypertensive heart and chronic kidney disease with heart failure and stage 1 through stage 4 chronic kidney disease, or unspecified chronic kidney disease: Secondary | ICD-10-CM | POA: Diagnosis not present

## 2018-07-17 DIAGNOSIS — N183 Chronic kidney disease, stage 3 (moderate): Secondary | ICD-10-CM | POA: Diagnosis not present

## 2018-07-17 DIAGNOSIS — L89154 Pressure ulcer of sacral region, stage 4: Secondary | ICD-10-CM | POA: Diagnosis not present

## 2018-07-17 DIAGNOSIS — J449 Chronic obstructive pulmonary disease, unspecified: Secondary | ICD-10-CM | POA: Diagnosis not present

## 2018-07-17 DIAGNOSIS — I509 Heart failure, unspecified: Secondary | ICD-10-CM | POA: Diagnosis not present

## 2018-07-18 DIAGNOSIS — L89154 Pressure ulcer of sacral region, stage 4: Secondary | ICD-10-CM | POA: Diagnosis not present

## 2018-07-18 DIAGNOSIS — J449 Chronic obstructive pulmonary disease, unspecified: Secondary | ICD-10-CM | POA: Diagnosis not present

## 2018-07-18 DIAGNOSIS — I130 Hypertensive heart and chronic kidney disease with heart failure and stage 1 through stage 4 chronic kidney disease, or unspecified chronic kidney disease: Secondary | ICD-10-CM | POA: Diagnosis not present

## 2018-07-18 DIAGNOSIS — I509 Heart failure, unspecified: Secondary | ICD-10-CM | POA: Diagnosis not present

## 2018-07-18 DIAGNOSIS — E119 Type 2 diabetes mellitus without complications: Secondary | ICD-10-CM | POA: Diagnosis not present

## 2018-07-18 DIAGNOSIS — N183 Chronic kidney disease, stage 3 (moderate): Secondary | ICD-10-CM | POA: Diagnosis not present

## 2018-07-22 DIAGNOSIS — N183 Chronic kidney disease, stage 3 (moderate): Secondary | ICD-10-CM | POA: Diagnosis not present

## 2018-07-22 DIAGNOSIS — L89154 Pressure ulcer of sacral region, stage 4: Secondary | ICD-10-CM | POA: Diagnosis not present

## 2018-07-22 DIAGNOSIS — I130 Hypertensive heart and chronic kidney disease with heart failure and stage 1 through stage 4 chronic kidney disease, or unspecified chronic kidney disease: Secondary | ICD-10-CM | POA: Diagnosis not present

## 2018-07-22 DIAGNOSIS — E119 Type 2 diabetes mellitus without complications: Secondary | ICD-10-CM | POA: Diagnosis not present

## 2018-07-22 DIAGNOSIS — I509 Heart failure, unspecified: Secondary | ICD-10-CM | POA: Diagnosis not present

## 2018-07-22 DIAGNOSIS — J449 Chronic obstructive pulmonary disease, unspecified: Secondary | ICD-10-CM | POA: Diagnosis not present

## 2018-07-23 DIAGNOSIS — I509 Heart failure, unspecified: Secondary | ICD-10-CM | POA: Diagnosis not present

## 2018-07-23 DIAGNOSIS — L89154 Pressure ulcer of sacral region, stage 4: Secondary | ICD-10-CM | POA: Diagnosis not present

## 2018-07-23 DIAGNOSIS — J449 Chronic obstructive pulmonary disease, unspecified: Secondary | ICD-10-CM | POA: Diagnosis not present

## 2018-07-23 DIAGNOSIS — E119 Type 2 diabetes mellitus without complications: Secondary | ICD-10-CM | POA: Diagnosis not present

## 2018-07-23 DIAGNOSIS — I130 Hypertensive heart and chronic kidney disease with heart failure and stage 1 through stage 4 chronic kidney disease, or unspecified chronic kidney disease: Secondary | ICD-10-CM | POA: Diagnosis not present

## 2018-07-23 DIAGNOSIS — N183 Chronic kidney disease, stage 3 (moderate): Secondary | ICD-10-CM | POA: Diagnosis not present

## 2018-07-24 ENCOUNTER — Telehealth: Payer: Self-pay

## 2018-07-24 DIAGNOSIS — E119 Type 2 diabetes mellitus without complications: Secondary | ICD-10-CM | POA: Diagnosis not present

## 2018-07-24 DIAGNOSIS — I509 Heart failure, unspecified: Secondary | ICD-10-CM | POA: Diagnosis not present

## 2018-07-24 DIAGNOSIS — N183 Chronic kidney disease, stage 3 (moderate): Secondary | ICD-10-CM | POA: Diagnosis not present

## 2018-07-24 DIAGNOSIS — L89154 Pressure ulcer of sacral region, stage 4: Secondary | ICD-10-CM | POA: Diagnosis not present

## 2018-07-24 DIAGNOSIS — I130 Hypertensive heart and chronic kidney disease with heart failure and stage 1 through stage 4 chronic kidney disease, or unspecified chronic kidney disease: Secondary | ICD-10-CM | POA: Diagnosis not present

## 2018-07-24 DIAGNOSIS — J449 Chronic obstructive pulmonary disease, unspecified: Secondary | ICD-10-CM | POA: Diagnosis not present

## 2018-07-24 NOTE — Telephone Encounter (Signed)
Patient does not have Internet capabilities and really does not want to do an e-visit. He wants to be seen in person.

## 2018-07-25 DIAGNOSIS — E119 Type 2 diabetes mellitus without complications: Secondary | ICD-10-CM | POA: Diagnosis not present

## 2018-07-25 DIAGNOSIS — J449 Chronic obstructive pulmonary disease, unspecified: Secondary | ICD-10-CM | POA: Diagnosis not present

## 2018-07-25 DIAGNOSIS — L89154 Pressure ulcer of sacral region, stage 4: Secondary | ICD-10-CM | POA: Diagnosis not present

## 2018-07-25 DIAGNOSIS — I130 Hypertensive heart and chronic kidney disease with heart failure and stage 1 through stage 4 chronic kidney disease, or unspecified chronic kidney disease: Secondary | ICD-10-CM | POA: Diagnosis not present

## 2018-07-25 DIAGNOSIS — I509 Heart failure, unspecified: Secondary | ICD-10-CM | POA: Diagnosis not present

## 2018-07-25 DIAGNOSIS — N183 Chronic kidney disease, stage 3 (moderate): Secondary | ICD-10-CM | POA: Diagnosis not present

## 2018-07-25 NOTE — Telephone Encounter (Signed)
Patient advised and verbalized understanding 

## 2018-07-25 NOTE — Telephone Encounter (Signed)
This is fine, he can come in as long as he is not having fever or respiratory symptoms.

## 2018-07-29 DIAGNOSIS — J449 Chronic obstructive pulmonary disease, unspecified: Secondary | ICD-10-CM | POA: Diagnosis not present

## 2018-07-29 DIAGNOSIS — I130 Hypertensive heart and chronic kidney disease with heart failure and stage 1 through stage 4 chronic kidney disease, or unspecified chronic kidney disease: Secondary | ICD-10-CM | POA: Diagnosis not present

## 2018-07-29 DIAGNOSIS — E119 Type 2 diabetes mellitus without complications: Secondary | ICD-10-CM | POA: Diagnosis not present

## 2018-07-29 DIAGNOSIS — N183 Chronic kidney disease, stage 3 (moderate): Secondary | ICD-10-CM | POA: Diagnosis not present

## 2018-07-29 DIAGNOSIS — I509 Heart failure, unspecified: Secondary | ICD-10-CM | POA: Diagnosis not present

## 2018-07-29 DIAGNOSIS — L89154 Pressure ulcer of sacral region, stage 4: Secondary | ICD-10-CM | POA: Diagnosis not present

## 2018-07-30 ENCOUNTER — Encounter: Payer: Self-pay | Admitting: Physician Assistant

## 2018-07-30 ENCOUNTER — Ambulatory Visit (INDEPENDENT_AMBULATORY_CARE_PROVIDER_SITE_OTHER): Payer: Medicare Other | Admitting: Physician Assistant

## 2018-07-30 ENCOUNTER — Other Ambulatory Visit: Payer: Self-pay

## 2018-07-30 VITALS — BP 126/74 | HR 114 | Temp 98.6°F | Resp 17 | Wt 170.0 lb

## 2018-07-30 DIAGNOSIS — E1111 Type 2 diabetes mellitus with ketoacidosis with coma: Secondary | ICD-10-CM | POA: Diagnosis not present

## 2018-07-30 DIAGNOSIS — E119 Type 2 diabetes mellitus without complications: Secondary | ICD-10-CM | POA: Diagnosis not present

## 2018-07-30 DIAGNOSIS — M79605 Pain in left leg: Secondary | ICD-10-CM | POA: Diagnosis not present

## 2018-07-30 DIAGNOSIS — E0590 Thyrotoxicosis, unspecified without thyrotoxic crisis or storm: Secondary | ICD-10-CM | POA: Diagnosis not present

## 2018-07-30 DIAGNOSIS — E1140 Type 2 diabetes mellitus with diabetic neuropathy, unspecified: Secondary | ICD-10-CM

## 2018-07-30 DIAGNOSIS — R9430 Abnormal result of cardiovascular function study, unspecified: Secondary | ICD-10-CM

## 2018-07-30 DIAGNOSIS — K760 Fatty (change of) liver, not elsewhere classified: Secondary | ICD-10-CM | POA: Diagnosis not present

## 2018-07-30 LAB — POCT GLYCOSYLATED HEMOGLOBIN (HGB A1C): Hemoglobin A1C: 9.5 % — AB (ref 4.0–5.6)

## 2018-07-30 NOTE — Progress Notes (Signed)
Patient: Joseph Luna Male    DOB: 1951-01-14   68 y.o.   MRN: [id number] Visit Date: 08/15/2018  Today's Provider: Trey Sailors, PA-C   Chief Complaint  Patient presents with  . Diabetes   Subjective:     HPI   Patient is presenting as a new patient today. Previously seen by [facility].   Lives alone in [place], has two children aged 39 and 24. 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 Worked for total of 18 years.  Tobacco Abuse Smokes 3-4 cigarettes daily currently. Previously smoked a pack a day  Started smoking at age 89. No prior lung cancer screening.  Substance use: Reports history of alcoholism for 4-5 months but stopped drinking 15 years ago. Does not use drugs.   Diabetes Mellitus: He reports not knowing he had diabetes until he was found down in his house unresponsive and had to be admitted to the hospital on 06/13/2018 for DKA and diabetic coma. A1c at that time was >15.5. Was treated for pneumonia during hospitalization as well. He reports he had to [facility]. Reports he is taking 70/30 insulin 27 units twice daily with meals. Says 70/30 is very expensive and he can't afford it.   Lab Results  Component Value Date   HGBA1C 9.5 (A) 07/30/2018    Hyperthyroidism: Previously saw Dr. Gershon Crane for hyperthyroidism remotely but was lost to follow up. Currently taking methimazole.   Left Leg Pain and Numbness: Reports he feels a numbness sensation from his knee down into his shin of his left leg. He reports when he had DKA he fell onto his legs but he has been having difficulty with this leg before this. He reports he was discharged with a rolling walker but now uses a cane. Has difficulty lifting his foot.    Allergies  Allergen Reactions  . Heparin      Current Outpatient Medications:  .  aspirin EC 81 MG EC tablet, Take 1 tablet (81 mg total) by mouth daily., Disp:  30 tablet, Rfl: 0 .  Insulin Isophane & Regular Human (NOVOLIN 70/30 FLEXPEN RELION) (70-30) 100 UNIT/ML PEN, Inject 21 Units into the skin 2 (two) times daily with a meal., Disp: 15 mL, Rfl: 0 .  methimazole (TAPAZOLE) 10 MG tablet, Take 20 mg by mouth daily., Disp: , Rfl:  .  metoprolol succinate (TOPROL-XL) 50 MG 24 hr tablet, Take 1 tablet (50 mg total) by mouth daily. Take with or immediately following a meal., Disp: 30 tablet, Rfl: 0  Review of Systems  Constitutional: Negative.   HENT: Negative.   Eyes: Negative.   Respiratory: Negative.   Cardiovascular: Negative.   Gastrointestinal: Negative.   Endocrine: Negative.   Genitourinary: Negative.   Musculoskeletal: Negative.   Skin: Negative.   Allergic/Immunologic: Negative.   Neurological: Negative.   Hematological: Negative.   Psychiatric/Behavioral: Negative.     Social History   Tobacco Use  . Smoking status: Current Every Day Smoker  . Smokeless tobacco: Never Used  Substance Use Topics  . Alcohol use: Not Currently      Objective:   BP 126/74 (BP Location: Left Arm, Patient Position: Sitting, Cuff Size: Normal)   Pulse (!) 114   Temp 98.6 F (37 C) (Oral)   Resp 17   Wt 170 lb (77.1 kg)   SpO2 95%   BMI 23.06 kg/m  Vitals:   07/30/18  1422  BP: 126/74  Pulse: (!) 114  Resp: 17  Temp: 98.6 F (37 C)  TempSrc: Oral  SpO2: 95%  Weight: 170 lb (77.1 kg)     Physical Exam Constitutional:      Appearance: Normal appearance.  Cardiovascular:     Rate and Rhythm: Normal rate and regular rhythm.     Heart sounds: Normal heart sounds.     Comments: Cap refill < 3 sec BLE Pulmonary:     Breath sounds: Normal breath sounds.  Musculoskeletal:     Comments: Left foot dorsiflexion 4/5, right foot 5/5. Hair distribution even on both legs and legs appear warm and well perfused. Knees appear grossly normal with smooth tracking of patella. Knee extension 4+/5 on left and 5/5 on right.   Skin:    General: Skin  is warm and dry.  Neurological:     Mental Status: He is alert.  Psychiatric:        Mood and Affect: Mood normal.        Behavior: Behavior normal.         Assessment & Plan    1. Type 2 diabetes mellitus with ketoacidotic coma, unspecified whether long term insulin use (HCC)  Gave samples of basaglar and humalog. May take 20 units glargine nightly and 5 units humalog TID if he runs out of mixed insulin, but he will need to see endocrinology. He is making insulin, may benefit from noninsulin therapy. Needs eye exam. Foot exam reveals neuropathy. He will need to be on a statin, can address at next visit.   - POCT HgB A1C - Comprehensive Metabolic Panel (CMET) - CBC with Differential - Lipid Profile - C-peptide - Ambulatory referral to Chronic Care Management Services - Urine Microalbumin w/creat. ratio  2. Type 2 diabetes mellitus with diabetic neuropathy, unspecified whether long term insulin use (HCC)   3. Fatty liver   4. Left leg pain  Peroneal nerve injury vs. Lumbar pathology. Knee does not appear or feel dislocated. Offered xray, gabapentin and referral to ortho. Patient prefers to wait.   5. Hyperthyroidism  He was followed previously by Dr. Turner Daniels'Connel in endocrinology, continue methimazole for now but he will make final recommendations.  - TSH+T4F+T3Free  6. Ejection fraction < 50%  EF 45-50% during hospitalization, there was a troponin bump at that time thought to be due to demand ischemia. Questionable heart failure he is on metoprolol succinate 50 mg daily.   The entirety of the information documented in the History of Present Illness, Review of Systems and Physical Exam were personally obtained by me. Portions of this information were initially documented by Loura Backesha Sutton, CMA and reviewed by me for thoroughness and accuracy.   Return in about 2 months (around 09/29/2018) for Dm.          Trey SailorsAdriana M Pollak, PA-C  [facility]

## 2018-07-31 ENCOUNTER — Telehealth: Payer: Self-pay | Admitting: Physician Assistant

## 2018-07-31 LAB — CBC WITH DIFFERENTIAL/PLATELET
Basophils Absolute: 0 10*3/uL (ref 0.0–0.2)
Basos: 0 %
EOS (ABSOLUTE): 0.1 10*3/uL (ref 0.0–0.4)
Eos: 2 %
Hematocrit: 39.2 % (ref 37.5–51.0)
Hemoglobin: 13.1 g/dL (ref 13.0–17.7)
Immature Grans (Abs): 0 10*3/uL (ref 0.0–0.1)
Immature Granulocytes: 0 %
Lymphocytes Absolute: 2.5 10*3/uL (ref 0.7–3.1)
Lymphs: 36 %
MCH: 30.8 pg (ref 26.6–33.0)
MCHC: 33.4 g/dL (ref 31.5–35.7)
MCV: 92 fL (ref 79–97)
Monocytes Absolute: 0.7 10*3/uL (ref 0.1–0.9)
Monocytes: 9 %
Neutrophils Absolute: 3.7 10*3/uL (ref 1.4–7.0)
Neutrophils: 53 %
Platelets: 246 10*3/uL (ref 150–450)
RBC: 4.26 x10E6/uL (ref 4.14–5.80)
RDW: 13.4 % (ref 11.6–15.4)
WBC: 7.1 10*3/uL (ref 3.4–10.8)

## 2018-07-31 LAB — LIPID PANEL
Chol/HDL Ratio: 4.9 ratio (ref 0.0–5.0)
Cholesterol, Total: 183 mg/dL (ref 100–199)
HDL: 37 mg/dL — ABNORMAL LOW (ref >39)
LDL Calculated: 92 mg/dL (ref 0–99)
Triglycerides: 270 mg/dL — ABNORMAL HIGH (ref 0–149)
VLDL Cholesterol Cal: 54 mg/dL — ABNORMAL HIGH (ref 5–40)

## 2018-07-31 LAB — COMPREHENSIVE METABOLIC PANEL
ALT: 8 IU/L (ref 0–44)
AST: 11 IU/L (ref 0–40)
Albumin/Globulin Ratio: 1.8 (ref 1.2–2.2)
Albumin: 4.6 g/dL (ref 3.8–4.8)
Alkaline Phosphatase: 60 IU/L (ref 39–117)
BUN/Creatinine Ratio: 17 (ref 10–24)
BUN: 15 mg/dL (ref 8–27)
Bilirubin Total: 0.2 mg/dL (ref 0.0–1.2)
CO2: 22 mmol/L (ref 20–29)
Calcium: 9.4 mg/dL (ref 8.6–10.2)
Chloride: 103 mmol/L (ref 96–106)
Creatinine, Ser: 0.87 mg/dL (ref 0.76–1.27)
GFR calc Af Amer: 103 mL/min/{1.73_m2} (ref >59)
GFR calc non Af Amer: 89 mL/min/{1.73_m2} (ref >59)
Globulin, Total: 2.6 g/dL (ref 1.5–4.5)
Glucose: 130 mg/dL — ABNORMAL HIGH (ref 65–99)
Potassium: 4.1 mmol/L (ref 3.5–5.2)
Sodium: 142 mmol/L (ref 134–144)
Total Protein: 7.2 g/dL (ref 6.0–8.5)

## 2018-07-31 LAB — TSH+T4F+T3FREE
Free T4: 1.13 ng/dL (ref 0.82–1.77)
T3, Free: 2.8 pg/mL (ref 2.0–4.4)
TSH: 1.42 u[IU]/mL (ref 0.450–4.500)

## 2018-07-31 LAB — MICROALBUMIN / CREATININE URINE RATIO
Creatinine, Urine: 188.4 mg/dL
Microalb/Creat Ratio: 10 mg/g creat (ref 0–29)
Microalbumin, Urine: 18.3 ug/mL

## 2018-07-31 LAB — C-PEPTIDE: C-Peptide: 7.2 ng/mL — ABNORMAL HIGH (ref 1.1–4.4)

## 2018-07-31 NOTE — Telephone Encounter (Signed)
I have called [facility] Endocrinology to let them know patient missed his appointment on 06/20/2018 due to being hospitalized for unresponsiveness 2/2 DKA and that he does still need to be seen. Requested he be seen for diabetes and hyperthyroidism as he was only being seen for hyperthyroidism by Dr. Gershon Crane last year. Clinic agreeable and reports they will reach out to patient.   I have also called patient to tell him this but received voice messaging system and left clinic number to call back. I am still reviewing insulin with clinical pharmacist but if he gets into endocrinologist in the next few days that would be ideal.

## 2018-07-31 NOTE — Telephone Encounter (Signed)
Spoke to pt and he advised that [facility] has called and has him scheduled for endo.  He advised that he has spoke to [place] also.  dbs

## 2018-08-01 DIAGNOSIS — N183 Chronic kidney disease, stage 3 (moderate): Secondary | ICD-10-CM | POA: Diagnosis not present

## 2018-08-01 DIAGNOSIS — I509 Heart failure, unspecified: Secondary | ICD-10-CM | POA: Diagnosis not present

## 2018-08-01 DIAGNOSIS — E119 Type 2 diabetes mellitus without complications: Secondary | ICD-10-CM | POA: Diagnosis not present

## 2018-08-01 DIAGNOSIS — L89154 Pressure ulcer of sacral region, stage 4: Secondary | ICD-10-CM | POA: Diagnosis not present

## 2018-08-01 DIAGNOSIS — I130 Hypertensive heart and chronic kidney disease with heart failure and stage 1 through stage 4 chronic kidney disease, or unspecified chronic kidney disease: Secondary | ICD-10-CM | POA: Diagnosis not present

## 2018-08-01 DIAGNOSIS — J449 Chronic obstructive pulmonary disease, unspecified: Secondary | ICD-10-CM | POA: Diagnosis not present

## 2018-08-02 DIAGNOSIS — Z8701 Personal history of pneumonia (recurrent): Secondary | ICD-10-CM | POA: Diagnosis not present

## 2018-08-02 DIAGNOSIS — Z87440 Personal history of urinary (tract) infections: Secondary | ICD-10-CM | POA: Diagnosis not present

## 2018-08-02 DIAGNOSIS — J449 Chronic obstructive pulmonary disease, unspecified: Secondary | ICD-10-CM | POA: Diagnosis not present

## 2018-08-02 DIAGNOSIS — E039 Hypothyroidism, unspecified: Secondary | ICD-10-CM | POA: Diagnosis not present

## 2018-08-02 DIAGNOSIS — Z9181 History of falling: Secondary | ICD-10-CM | POA: Diagnosis not present

## 2018-08-02 DIAGNOSIS — G4700 Insomnia, unspecified: Secondary | ICD-10-CM | POA: Diagnosis not present

## 2018-08-02 DIAGNOSIS — I130 Hypertensive heart and chronic kidney disease with heart failure and stage 1 through stage 4 chronic kidney disease, or unspecified chronic kidney disease: Secondary | ICD-10-CM | POA: Diagnosis not present

## 2018-08-02 DIAGNOSIS — E789 Disorder of lipoprotein metabolism, unspecified: Secondary | ICD-10-CM | POA: Diagnosis not present

## 2018-08-02 DIAGNOSIS — Z794 Long term (current) use of insulin: Secondary | ICD-10-CM | POA: Diagnosis not present

## 2018-08-02 DIAGNOSIS — Z79891 Long term (current) use of opiate analgesic: Secondary | ICD-10-CM | POA: Diagnosis not present

## 2018-08-02 DIAGNOSIS — K219 Gastro-esophageal reflux disease without esophagitis: Secondary | ICD-10-CM | POA: Diagnosis not present

## 2018-08-02 DIAGNOSIS — E119 Type 2 diabetes mellitus without complications: Secondary | ICD-10-CM | POA: Diagnosis not present

## 2018-08-02 DIAGNOSIS — E43 Unspecified severe protein-calorie malnutrition: Secondary | ICD-10-CM | POA: Diagnosis not present

## 2018-08-02 DIAGNOSIS — M1990 Unspecified osteoarthritis, unspecified site: Secondary | ICD-10-CM | POA: Diagnosis not present

## 2018-08-02 DIAGNOSIS — K5900 Constipation, unspecified: Secondary | ICD-10-CM | POA: Diagnosis not present

## 2018-08-02 DIAGNOSIS — Z7982 Long term (current) use of aspirin: Secondary | ICD-10-CM | POA: Diagnosis not present

## 2018-08-02 DIAGNOSIS — N183 Chronic kidney disease, stage 3 (moderate): Secondary | ICD-10-CM | POA: Diagnosis not present

## 2018-08-02 DIAGNOSIS — L89154 Pressure ulcer of sacral region, stage 4: Secondary | ICD-10-CM | POA: Diagnosis not present

## 2018-08-02 DIAGNOSIS — I509 Heart failure, unspecified: Secondary | ICD-10-CM | POA: Diagnosis not present

## 2018-08-05 ENCOUNTER — Ambulatory Visit: Payer: Self-pay

## 2018-08-05 DIAGNOSIS — J9601 Acute respiratory failure with hypoxia: Secondary | ICD-10-CM

## 2018-08-05 DIAGNOSIS — E119 Type 2 diabetes mellitus without complications: Secondary | ICD-10-CM

## 2018-08-05 DIAGNOSIS — E43 Unspecified severe protein-calorie malnutrition: Secondary | ICD-10-CM

## 2018-08-05 NOTE — Chronic Care Management (AMB) (Signed)
   Chronic Care Management   Unsuccessful Call Note 08/05/2018 Name: Joseph Luna MRN: [id number] DOB: 04/04/1951  Almira Coaster is a 68 year old male who sees Osvaldo Angst, [place] for primary care. Ms. Jodi Marble asked the CCM team to consult the patient for chronic case management secondary to his need for DM management Referral was placed 07/30/2018 during last office visit.    Was unable to reach patient via telephone today for introduction of services. I have left HIPAA compliant voicemail asking patient to return my call. (unsuccessful outreach #1).   Plan: Will follow-up within 7 business days via telephone.    Zayna Toste E. Suzie Portela, RN, BSN Nurse Care Coordinator [facility] Care Management [phone]

## 2018-08-06 DIAGNOSIS — J449 Chronic obstructive pulmonary disease, unspecified: Secondary | ICD-10-CM | POA: Diagnosis not present

## 2018-08-06 DIAGNOSIS — I509 Heart failure, unspecified: Secondary | ICD-10-CM | POA: Diagnosis not present

## 2018-08-06 DIAGNOSIS — L89154 Pressure ulcer of sacral region, stage 4: Secondary | ICD-10-CM | POA: Diagnosis not present

## 2018-08-06 DIAGNOSIS — N183 Chronic kidney disease, stage 3 (moderate): Secondary | ICD-10-CM | POA: Diagnosis not present

## 2018-08-06 DIAGNOSIS — E119 Type 2 diabetes mellitus without complications: Secondary | ICD-10-CM | POA: Diagnosis not present

## 2018-08-06 DIAGNOSIS — I130 Hypertensive heart and chronic kidney disease with heart failure and stage 1 through stage 4 chronic kidney disease, or unspecified chronic kidney disease: Secondary | ICD-10-CM | POA: Diagnosis not present

## 2018-08-07 DIAGNOSIS — E0590 Thyrotoxicosis, unspecified without thyrotoxic crisis or storm: Secondary | ICD-10-CM | POA: Diagnosis not present

## 2018-08-07 DIAGNOSIS — Z794 Long term (current) use of insulin: Secondary | ICD-10-CM | POA: Diagnosis not present

## 2018-08-07 DIAGNOSIS — E1165 Type 2 diabetes mellitus with hyperglycemia: Secondary | ICD-10-CM | POA: Diagnosis not present

## 2018-08-08 DIAGNOSIS — J449 Chronic obstructive pulmonary disease, unspecified: Secondary | ICD-10-CM | POA: Diagnosis not present

## 2018-08-08 DIAGNOSIS — L89154 Pressure ulcer of sacral region, stage 4: Secondary | ICD-10-CM | POA: Diagnosis not present

## 2018-08-08 DIAGNOSIS — N183 Chronic kidney disease, stage 3 (moderate): Secondary | ICD-10-CM | POA: Diagnosis not present

## 2018-08-08 DIAGNOSIS — I509 Heart failure, unspecified: Secondary | ICD-10-CM | POA: Diagnosis not present

## 2018-08-08 DIAGNOSIS — I130 Hypertensive heart and chronic kidney disease with heart failure and stage 1 through stage 4 chronic kidney disease, or unspecified chronic kidney disease: Secondary | ICD-10-CM | POA: Diagnosis not present

## 2018-08-08 DIAGNOSIS — E119 Type 2 diabetes mellitus without complications: Secondary | ICD-10-CM | POA: Diagnosis not present

## 2018-08-12 ENCOUNTER — Ambulatory Visit: Payer: Self-pay

## 2018-08-12 DIAGNOSIS — E43 Unspecified severe protein-calorie malnutrition: Secondary | ICD-10-CM

## 2018-08-12 DIAGNOSIS — E0590 Thyrotoxicosis, unspecified without thyrotoxic crisis or storm: Secondary | ICD-10-CM

## 2018-08-12 DIAGNOSIS — E119 Type 2 diabetes mellitus without complications: Secondary | ICD-10-CM

## 2018-08-12 NOTE — Chronic Care Management (AMB) (Signed)
   Chronic Care Management   Unsuccessful Call Note 08/05/2018 Name: Joseph Luna            MRN: [id number]       DOB: 1950-11-17  Joseph Luna is a 68 year old malewho sees Osvaldo Angst, [place] for primary care. Joseph Luna the CCM team to consult the patient for chronic case management secondary to his need for DM management Referral was placed4/14/2020 during last office visit.  Was able to reach patient via telephone today forintroduction of services however telephone connectivity was poor and lost. Immediately called back but no answer. Ihave left HIPAA compliant voicemail asking patient to return my call. (unsuccessful outreach #2).  Plan: Will follow-up within 7business days via telephone.   Lataja Newland E. Suzie Portela, RN, BSN Nurse Care Coordinator [facility] Care Management [phone]

## 2018-08-13 ENCOUNTER — Ambulatory Visit: Payer: Self-pay

## 2018-08-13 DIAGNOSIS — E43 Unspecified severe protein-calorie malnutrition: Secondary | ICD-10-CM

## 2018-08-13 DIAGNOSIS — I509 Heart failure, unspecified: Secondary | ICD-10-CM | POA: Diagnosis not present

## 2018-08-13 DIAGNOSIS — N183 Chronic kidney disease, stage 3 (moderate): Secondary | ICD-10-CM | POA: Diagnosis not present

## 2018-08-13 DIAGNOSIS — J449 Chronic obstructive pulmonary disease, unspecified: Secondary | ICD-10-CM | POA: Diagnosis not present

## 2018-08-13 DIAGNOSIS — L89154 Pressure ulcer of sacral region, stage 4: Secondary | ICD-10-CM | POA: Diagnosis not present

## 2018-08-13 DIAGNOSIS — E119 Type 2 diabetes mellitus without complications: Secondary | ICD-10-CM

## 2018-08-13 DIAGNOSIS — E0590 Thyrotoxicosis, unspecified without thyrotoxic crisis or storm: Secondary | ICD-10-CM

## 2018-08-13 DIAGNOSIS — I130 Hypertensive heart and chronic kidney disease with heart failure and stage 1 through stage 4 chronic kidney disease, or unspecified chronic kidney disease: Secondary | ICD-10-CM | POA: Diagnosis not present

## 2018-08-13 NOTE — Chronic Care Management (AMB) (Signed)
  Chronic Care Management   Note  08/13/2018 Name: DIN BOOKWALTER MRN: [id number] DOB: [birth year]  Regino Schultze Bogeris a 68year old [place], PA-Cfor primary care. Ms. Ramon Dredge the CCM team to consult the patient forchronic case management secondary to his need for DM managementReferral was placed4/14/2020 during last office visit.  SDOH (Social Determinants of Health) screening performed today. See Care Plan Entry related to challenges with: Financial Strain (medication affordability secondary to no pharmacy coverage)  Mr. Manzer was given information about Chronic Care Management services today including:  1. CCM service includes personalized support from designated clinical staff supervised by his physician, including individualized plan of care and coordination with other care providers 2. 24/7 contact phone numbers for assistance for urgent and routine care needs. 3. Service will only be billed when office clinical staff spend 20 minutes or more in a month to coordinate care. 4. Only one practitioner may furnish and bill the service in a calendar month. 5. The patient may stop CCM services at any time (effective at the end of the month) by phone call to the office staff. 6. The patient will be responsible for cost sharing (co-pay) of up to 20% of the service fee (after annual deductible is met).  Patient agreed to services and verbal consent obtained.      Plan: CCM RN CM initial telephone assessment scheduled for 08/16/2018 at 9:30. Referral to [place]. Rollene Rotunda, RN, BSN Nurse Care Coordinator [facility] Care Management [phone]

## 2018-08-13 NOTE — Patient Instructions (Signed)
1. Thank You for allowing the CCM (Chronic Care Management) Team to assist you with your healthcare goals!! We look forward to meeting you on 08/16/2018 at 9:30 2. Please bring ALL medications to your appointment! If you have a blood sugar meter or a blood pressure monitor at home, bring those as well.  3.  Contact the CCM Team if you have any question or need to reschedule your initial visit.  CCM (Chronic Care Management) Team   Trish Fountain RN, BSN Nurse Care Coordinator  [phone]  Ruben Reason PharmD  Clinical Pharmacist  [phone]   Elliot Gurney, LCSW Clinical Social Worker [phone]  Mr. Megna was given information about Chronic Care Management services today including:  1. CCM service includes personalized support from designated clinical staff supervised by his physician, including individualized plan of care and coordination with other care providers 2. 24/7 contact phone numbers for assistance for urgent and routine care needs. 3. Service will only be billed when office clinical staff spend 20 minutes or more in a month to coordinate care. 4. Only one practitioner may furnish and bill the service in a calendar month. 5. The patient may stop CCM services at any time (effective at the end of the month) by phone call to the office staff. 6. The patient will be responsible for cost sharing (co-pay) of up to 20% of the service fee (after annual deductible is met).  Patient agreed to services and verbal consent obtained.

## 2018-08-15 DIAGNOSIS — I130 Hypertensive heart and chronic kidney disease with heart failure and stage 1 through stage 4 chronic kidney disease, or unspecified chronic kidney disease: Secondary | ICD-10-CM | POA: Diagnosis not present

## 2018-08-15 DIAGNOSIS — I509 Heart failure, unspecified: Secondary | ICD-10-CM | POA: Diagnosis not present

## 2018-08-15 DIAGNOSIS — L89154 Pressure ulcer of sacral region, stage 4: Secondary | ICD-10-CM | POA: Diagnosis not present

## 2018-08-15 DIAGNOSIS — E1111 Type 2 diabetes mellitus with ketoacidosis with coma: Secondary | ICD-10-CM | POA: Insufficient documentation

## 2018-08-15 DIAGNOSIS — E1140 Type 2 diabetes mellitus with diabetic neuropathy, unspecified: Secondary | ICD-10-CM | POA: Insufficient documentation

## 2018-08-15 DIAGNOSIS — J449 Chronic obstructive pulmonary disease, unspecified: Secondary | ICD-10-CM | POA: Diagnosis not present

## 2018-08-15 DIAGNOSIS — E119 Type 2 diabetes mellitus without complications: Secondary | ICD-10-CM | POA: Diagnosis not present

## 2018-08-15 DIAGNOSIS — E0590 Thyrotoxicosis, unspecified without thyrotoxic crisis or storm: Secondary | ICD-10-CM | POA: Insufficient documentation

## 2018-08-15 DIAGNOSIS — N183 Chronic kidney disease, stage 3 (moderate): Secondary | ICD-10-CM | POA: Diagnosis not present

## 2018-08-15 DIAGNOSIS — R9430 Abnormal result of cardiovascular function study, unspecified: Secondary | ICD-10-CM | POA: Insufficient documentation

## 2018-08-16 ENCOUNTER — Other Ambulatory Visit: Payer: Self-pay

## 2018-08-16 ENCOUNTER — Ambulatory Visit (INDEPENDENT_AMBULATORY_CARE_PROVIDER_SITE_OTHER): Payer: Medicare Other

## 2018-08-16 DIAGNOSIS — E0590 Thyrotoxicosis, unspecified without thyrotoxic crisis or storm: Secondary | ICD-10-CM

## 2018-08-16 DIAGNOSIS — E119 Type 2 diabetes mellitus without complications: Secondary | ICD-10-CM | POA: Diagnosis not present

## 2018-08-16 DIAGNOSIS — E1111 Type 2 diabetes mellitus with ketoacidosis with coma: Secondary | ICD-10-CM | POA: Diagnosis not present

## 2018-08-16 NOTE — Chronic Care Management (AMB) (Signed)
Chronic Care Management   Initial Visit Note  08/16/2018 Name: Joseph Luna MRN: [id number] DOB: 1950/04/20  Subjective: "I have not had any education except what I have read in the Internet" "I don't have any prescription coverage so I need the cheapest medicine"  Objective:  Lab Results  Component Value Date   HGBA1C 9.5 (A) 07/30/2018   HGBA1C >15.5 (H) 06/13/2018    Assessment:  Joseph Luna a 68year old malewho seesAdriana Terrilee Croak, PA-Cfor primary care. Ms. Ramon Luna the CCM team to consult the patient forchronic case management secondary to his need for DM management.Referral was placed4/14/2020 during last office visit.  Joseph Luna was recently diagnoses with diabetes after sustaining a fall at home while in DKA. He was in ICU, intubated and after a long hospitalization, discharged to [facility] for rehab. He was discharged mid March to home with home health RN and PT. He has completed his PT however Nursing continues to see Joseph Luna to address pressure wound care.  Today CCM RN CM completed initial case management health assessment and assisted patient with setting goals. He was provided DM education verbally and via postal service (mail).  Review of patient status, including review of consultants reports, relevant laboratory and other test results, and collaboration with appropriate care team members and the patient's provider was performed as part of comprehensive patient evaluation and provision of chronic care management services.     1 hospitalization/0 ED visits in 6 months  <no information>   Goals Addressed            This Visit's Progress   . I have not had any diabetes education (pt-stated)       Joseph Luna admits to taking his health for granted in the past. He has hyperthyroidism and was seaking treatment from his endocrinologist but did not complete his health maintenance appointment over the past couple of years. Feb 27 he collapsed in his  home and was found to be in DKA upon hospital admission. Until this event, he was unaware of his diabetes. He has not had any formal DM education. He is interested in DM classes once Covid-19 restrictions are lifted. In the interim, CCM RN CM will provide education and support to ensure patient's success with DM self care and management. He has had his initial follow up appointment with Joseph Luna who also manages his hyperthyroidism. He has been able to decrease his A1C from >15 to 9.5 since discharge.   Current Barriers:  [name] Knowledge Deficits related to basic Diabetes pathophysiology and self care/management . Financial strain (lack of medication coverage)  Nurse Case Manager Clinical Goal(s):  [name] Over the next 14 days, patient will demonstrate improved adherence to prescribed treatment plan for diabetes self care/management as evidenced by checking blood glucose levels as prescribed, adhering to ADA/low carb diet, daily exercise, and medication adherence  Interventions:  . Provided education to patient re: diabetes . Reviewed medications with patient and discussed importance of medication adherence . Discussed plans with patient for ongoing care management follow up and provided patient with direct contact information for care management team . Provided patient with written educational materials related to hypo and hyperglycemia and importance of correct treatment . Reviewed scheduled/upcoming provider appointments including:  . Advised patient, providing education and rationale, to check cbg TID and record, calling Joseph Luna for findings outside established parameters.    Patient Self Care Activities:  . Self administers medications as prescribed . Attends all scheduled provider appointments .  Checks blood sugars as prescribed and utilize hyper and hypoglycemia protocol as needed . Adhere to ADA diet as discussed . Makes eye and dental appointments ASAP . Engage with [facility]  Pharmacist for ongoing medication management and affordibility  Plan:  . RNCM will provided patient educationa materials via mail   Initial goal documentation     . My left leg will give out on me if I am not careful (pt-stated)       Joseph Luna states he did not have any difficulty with ambulation prior to his hospitalization. Since discharge, he has had ongoing "nerve pain" in his left leg. He also has significant weakness upon standing or ambulating any length of time. He states he has to utilize his Rolator for long errands because "my leg will just give out on me and I have to sit before I fall". He is diagnoses with DM neuropathy but is unsure why it only is affecting his left side. States he is to see a neurologist when COVID-19 restrictions are lifted. He also states there are times when he is unable to pick his left foot up and it "drags". He admits he is at a high risk for falling which is why he never ambulates without an assistive device (cane for very short distances and Rolator for further lengths)   Current Barriers:  [name] Knowledge Deficits related to fall precautions . Nerve damage left leg .  Nurse Case Manager Clinical Goal(s):  [name] Over the next 14 days, patient will demonstrate improved adherence to prescribed treatment plan for decreasing falls as evidenced by patient reporting and review of EMR . Over the next 14days, patient will verbalize using fall risk reduction strategies discussed . Over the next 30 days, patient will not experience falls .   Interventions:  . Provided written and verbal education re: Potential causes of falls and Fall prevention strategies . Assessed for recent falls . Assessed patients knowledge of fall risk prevention    Patient Self Care Activities:  . Utilize Rolator or Cane appropriately with all ambulation . De-clutter walkways . Change positions slowly . Wear secure fitting shoes at all times with ambulation . Utilize home lighting for  dim lit areas  Plan: . CCM RN CM will provided written education for fall prevention via mail   Initial goal documentation         Follow up plan:  Telephone follow up appointment with CCM team member scheduled for: 2 weeks   Joseph Luna was given information about Chronic Care Management services today including:  1. CCM service includes personalized support from designated clinical staff supervised by his physician, including individualized plan of care and coordination with other care providers 2. 24/7 contact phone numbers for assistance for urgent and routine care needs. 3. Service will only be billed when office clinical staff spend 20 minutes or more in a month to coordinate care. 4. Only one practitioner may furnish and bill the service in a calendar month. 5. The patient may stop CCM services at any time (effective at the end of the month) by phone call to the office staff. 6. The patient will be responsible for cost sharing (co-pay) of up to 20% of the service fee (after annual deductible is met).  Patient agreed to services and verbal consent obtained.  Nils Thor E. Rollene Rotunda, RN, BSN Nurse Care Coordinator [facility] [phone]

## 2018-08-16 NOTE — Patient Instructions (Addendum)
Thank you allowing the Chronic Care Management Team to be a part of your care! It was a pleasure speaking with you today!  1. Continue to take you medication as prescribed. Switch to 70/30 insulin when you have run out of Basaglar 2. Continue to check you blood sugars 3-4 times a day. Every morning before you eat, before lunch, and before dinner. 3. Continue to exercise every day. Daily exercise reduces your blood sugars and reduces your risk for stroke and heart attack (your risk is higher with diabetes and smoking) 4. Please consider completing the Advanced Directives (living will and health care power of attorney) that is included in this packet. You can call me if you have any question about completion 5. Review Diabetes education material below. We will discuss in 2 weeks but you can call me if you have any questions before. 6. Please make an eye appointment AND dental appointment ASAP. If you have difficulty affording these please let me know. 7. You must quit smoking if you want to decrease your risk of Heart Attack and Stroke. You can call 1800 Quit Now for assistance   CCM (Chronic Care Management) Team   Trish Fountain RN, BSN Nurse Care Coordinator  [phone]  Ruben Reason PharmD  Clinical Pharmacist  [phone]   Elliot Gurney, LCSW Clinical Social Worker [phone]  Goals Addressed            This Visit's Progress   . I have not had any diabetes education (pt-stated)       Current Barriers:  [name] Knowledge Deficits related to basic Diabetes pathophysiology and self care/management . Financial strain (lack of medication coverage)  Nurse Case Manager Clinical Goal(s):  [name] Over the next 14 days, patient will demonstrate improved adherence to prescribed treatment plan for diabetes self care/management as evidenced by checking blood glucose levels as prescribed, adhering to ADA/low carb diet, daily exercise, and medication adherence  Interventions:   . Provided education to patient re: diabetes . Reviewed medications with patient and discussed importance of medication adherence . Discussed plans with patient for ongoing care management follow up and provided patient with direct contact information for care management team . Provided patient with written educational materials related to hypo and hyperglycemia and importance of correct treatment . Reviewed scheduled/upcoming provider appointments including:  . Advised patient, providing education and rationale, to check cbg TID and record, calling Dr. Honor Junes for findings outside established parameters.    Patient Self Care Activities:  . Self administers medications as prescribed . Attends all scheduled provider appointments . Checks blood sugars as prescribed and utilize hyper and hypoglycemia protocol as needed . Adhere to ADA diet as discussed . Makes eye and dental appointments ASAP . Engage with [facility] Pharmacist for ongoing medication management and affordibility  Plan:  . RNCM will provided patient educationa materials via mail   Initial goal documentation     . My left leg will give out on me if I am not careful (pt-stated)       Current Barriers:  [name] Knowledge Deficits related to fall precautions . Nerve damage left leg .  Nurse Case Manager Clinical Goal(s):  [name] Over the next 14 days, patient will demonstrate improved adherence to prescribed treatment plan for decreasing falls as evidenced by patient reporting and review of EMR . Over the next 14days, patient will verbalize using fall risk reduction strategies discussed . Over the next 30 days, patient will not experience falls .  Interventions:  . Provided written and verbal education re: Potential causes of falls and Fall prevention strategies . Assessed for recent falls . Assessed patients knowledge of fall risk prevention    Patient Self Care Activities:  . Utilize Rolator or Cane appropriately with all  ambulation . De-clutter walkways . Change positions slowly . Wear secure fitting shoes at all times with ambulation . Utilize home lighting for dim lit areas  Plan: . CCM RN CM will provided written education for fall prevention via mail   Initial goal documentation        Print copy of patient instructions provided.   Telephone follow up appointment with CCM team member scheduled for:2 weeks  SIGNS AND SYMPTOMS OF A STROKE   You have any symptoms of stroke. "BE FAST" is an easy way to remember the main warning signs: ? B - Balance. Signs are dizziness, sudden trouble walking, or loss of balance. ? E - Eyes. Signs are trouble seeing or a sudden change in how you see. ? F - Face. Signs are sudden weakness or loss of feeling of the face, or the face or eyelid drooping on one side. ? A - Arms. Signs are weakness or loss of feeling in an arm. This happens suddenly and usually on one side of the body. ? S - Speech. Signs are sudden trouble speaking, slurred speech, or trouble understanding what people say. ? T - Time. Time to call emergency services. Write down what time symptoms started.  You have other signs of stroke, such as: ? A sudden, very bad headache with no known cause. ? Feeling sick to your stomach (nausea). ? Throwing up (vomiting). ? Jerky movements you cannot control (seizure).  SIGNS AND SYMPTOMS OF A HEART ATTACK Symptoms of this condition include:  Chest pain. It may feel like: ? Crushing or squeezing. ? Tightness, pressure, fullness, or heaviness.  Pain in the arm, neck, jaw, back, or upper body.  Shortness of breath.  Heartburn.  Indigestion.  Nausea.  Cold sweats.  Feeling tired.  Sudden lightheadedness.  TIPS FOR AVOIDING FALLS It is important to avoid accidents which may result in broken bones.  Here are a few ideas on how to make your home safer so you will be less likely to trip or fall.  1. Use nonskid mats or non slip strips in your  shower or tub, on your bathroom floor and around sinks.  If you know that you have spilled water, wipe it up! 2. In the bathroom, it is important to have properly installed grab bars on the walls or on the edge of the tub.  Towel racks are NOT strong enough for you to hold onto or to pull on for support. 3. Stairs and hallways should have enough light.  Add lamps or night lights if you need ore light. 4. It is good to have handrails on both sides of the stairs if possible.  Always fix broken handrails right away. 5. It is important to see the edges of steps.  Paint the edges of outdoor steps white so you can see them better.  Put colored tape on the edge of inside steps. 6. Throw-rugs are dangerous because they can slide.  Removing the rugs is the best idea, but if they must stay, add adhesive carpet tape to prevent slipping. 7. Do not keep things on stairs or in the halls.  Remove small furniture that blocks the halls as it may cause you to trip.  Keep  telephone and electrical cords out of the way where you walk. 8. Always were sturdy, rubber-soled shoes for good support.  Never wear just socks, especially on the stairs.  Socks may cause you to slip or fall.  Do not wear full-length housecoats as you can easily trip on the bottom.  9. Place the things you use the most on the shelves that are the easiest to reach.  If you use a stepstool, make sure it is in good condition.  If you feel unsteady, DO NOT climb, ask for help. 10. If a health professional advises you to use a cane or walker, do not be ashamed.  These items can keep you from falling and breaking your bones.   Type 2 Diabetes Mellitus, Self Care, Adult When you have type 2 diabetes (type 2 diabetes mellitus), you must make sure your blood sugar (glucose) stays in a healthy range. You can do this with:  Nutrition.  Exercise.  Lifestyle changes.  Medicines or insulin, if needed.  Support from your doctors and others. How to stay  aware of blood sugar   Check your blood sugar level every day, as often as told.  Have your A1c (hemoglobin A1c) level checked two or more times a year. Have it checked more often if your doctor tells you to. Your doctor will set personal treatment goals for you. Generally, you should have these blood sugar levels:  Before meals (preprandial): 80-130 mg/dL (4.4-7.2 mmol/L).  After meals (postprandial): below 180 mg/dL (10 mmol/L).  A1c level: less than 7%. How to manage high and low blood sugar Signs of high blood sugar High blood sugar is called hyperglycemia. Know the signs of high blood sugar. Signs may include:  Feeling: ? Thirsty. ? Hungry. ? Very tired.  Needing to pee (urinate) more than usual.  Blurry vision. Signs of low blood sugar Low blood sugar is called hypoglycemia. This is when blood sugar is at or below 70 mg/dL (3.9 mmol/L). Signs may include:  Feeling: ? Hungry. ? Worried or nervous (anxious). ? Sweaty and clammy. ? Confused. ? Dizzy. ? Sleepy. ? Sick to your stomach (nauseous).  Having: ? A fast heartbeat. ? A headache. ? A change in your vision. ? Jerky movements that you cannot control (seizure). ? Tingling or no feeling (numbness) around your mouth, lips, or tongue.  Having trouble with: ? Moving (coordination). ? Sleeping. ? Passing out (fainting). ? Getting upset easily (irritability). Treating low blood sugar To treat low blood sugar, eat or drink something sugary right away. If you can think clearly and swallow safely, follow the 15:15 rule:  Take 15 grams of a fast-acting carb (carbohydrate). Talk with your doctor about how much you should take.  Some fast-acting carbs are: ? Sugar tablets (glucose pills). Take 3-4 pills. ? 6-8 pieces of hard candy. ? 4-6 oz (120-150 mL) of fruit juice. ? 4-6 oz (120-150 mL) of regular (not diet) soda. ? 1 Tbsp (15 mL) honey or sugar.  Check your blood sugar 15 minutes after you take the  carb.  If your blood sugar is still at or below 70 mg/dL (3.9 mmol/L), take 15 grams of a carb again.  If your blood sugar does not go above 70 mg/dL (3.9 mmol/L) after 3 tries, get help right away.  After your blood sugar goes back to normal, eat a meal or a snack within 1 hour. Treating very low blood sugar If your blood sugar is at or below 54 mg/dL (3  mmol/L), you have very low blood sugar (severe hypoglycemia). This is an emergency. Do not wait to see if the symptoms will go away. Get medical help right away. Call your local emergency services (911 in the [place] If you have very low blood sugar and you cannot eat or drink, you may need a glucagon shot (injection). A family member or friend should learn how to check your blood sugar and how to give you a glucagon shot. Ask your doctor if you need to have a glucagon shot kit at home. Follow these instructions at home: Medicine  Take insulin and diabetes medicines as told.  If your doctor says you should take more or less insulin and medicines, do this exactly as told.  Do not run out of insulin or medicines. Having diabetes can raise your risk for other long-term conditions. These include heart disease and kidney disease. Your doctor may prescribe medicines to help you not have these problems. Food   Make healthy food choices. These include: ? Chicken, fish, egg whites, and beans. ? Oats, whole wheat, bulgur, brown rice, quinoa, and millet. ? Fresh fruits and vegetables. ? Low-fat dairy products. ? Nuts, avocado, olive oil, and canola oil.  Meet with a food specialist (dietitian). He or she can help you make an eating plan that is right for you.  Follow instructions from your doctor about what you cannot eat or drink.  Drink enough fluid to keep your pee (urine) pale yellow.  Keep track of carbs that you eat. Do this by reading food labels and learning food serving sizes.  Follow your sick day plan when you cannot eat or drink  normally. Make this plan with your doctor so it is ready to use. Activity  Exercise 3 or more times a week.  Do not go more than 2 days without exercising.  Talk with your doctor before you start a new exercise. Your doctor may need to tell you to change: ? How much insulin or medicines you take. ? How much food you eat. Lifestyle  Do not use any tobacco products. These include cigarettes, chewing tobacco, and e-cigarettes. If you need help quitting, ask your doctor.  Ask your doctor how much alcohol is safe for you.  Learn to deal with stress. If you need help with this, ask your doctor. Body care   Stay up to date with your shots (immunizations).  Have your eyes and feet checked by a doctor as often as told.  Check your skin and feet every day. Check for cuts, bruises, redness, blisters, or sores.  Brush your teeth and gums two times a day. Floss one or more times a day.  Go to the dentist one or more times every 6 months.  Stay at a healthy weight. General instructions  Take over-the-counter and prescription medicines only as told by your doctor.  Share your diabetes care plan with: ? Your work or school. ? People you live with.  Carry a card or wear jewelry that says you have diabetes.  Keep all follow-up visits as told by your doctor. This is important. Questions to ask your doctor  Do I need to meet with a diabetes educator?  Where can I find a support group for people with diabetes? Where to find more information To learn more about diabetes, visit:  American Diabetes Association: www.diabetes.org  American Association of Diabetes Educators: www.diabeteseducator.org Summary  When you have type 2 diabetes, you must make sure your blood sugar (glucose) stays  in a healthy range.  Check your blood sugar every day, as often as told.  Having diabetes can raise your risk for other conditions. Your doctor may prescribe medicines to help you not have these  problems.  Keep all follow-up visits as told by your doctor. This is important. This information is not intended to replace advice given to you by your health care provider. Make sure you discuss any questions you have with your health care provider. Document Released: 07/26/2015 Document Revised: 09/24/2017 Document Reviewed: 05/07/2015 Elsevier Interactive Patient Education  2019 [employer].

## 2018-08-20 DIAGNOSIS — N183 Chronic kidney disease, stage 3 (moderate): Secondary | ICD-10-CM | POA: Diagnosis not present

## 2018-08-20 DIAGNOSIS — I509 Heart failure, unspecified: Secondary | ICD-10-CM | POA: Diagnosis not present

## 2018-08-20 DIAGNOSIS — E119 Type 2 diabetes mellitus without complications: Secondary | ICD-10-CM | POA: Diagnosis not present

## 2018-08-20 DIAGNOSIS — I130 Hypertensive heart and chronic kidney disease with heart failure and stage 1 through stage 4 chronic kidney disease, or unspecified chronic kidney disease: Secondary | ICD-10-CM | POA: Diagnosis not present

## 2018-08-20 DIAGNOSIS — J449 Chronic obstructive pulmonary disease, unspecified: Secondary | ICD-10-CM | POA: Diagnosis not present

## 2018-08-20 DIAGNOSIS — L89154 Pressure ulcer of sacral region, stage 4: Secondary | ICD-10-CM | POA: Diagnosis not present

## 2018-08-22 ENCOUNTER — Telehealth: Payer: Self-pay

## 2018-08-22 DIAGNOSIS — N183 Chronic kidney disease, stage 3 (moderate): Secondary | ICD-10-CM | POA: Diagnosis not present

## 2018-08-22 DIAGNOSIS — E119 Type 2 diabetes mellitus without complications: Secondary | ICD-10-CM | POA: Diagnosis not present

## 2018-08-22 DIAGNOSIS — I509 Heart failure, unspecified: Secondary | ICD-10-CM | POA: Diagnosis not present

## 2018-08-22 DIAGNOSIS — L89154 Pressure ulcer of sacral region, stage 4: Secondary | ICD-10-CM | POA: Diagnosis not present

## 2018-08-22 DIAGNOSIS — I130 Hypertensive heart and chronic kidney disease with heart failure and stage 1 through stage 4 chronic kidney disease, or unspecified chronic kidney disease: Secondary | ICD-10-CM | POA: Diagnosis not present

## 2018-08-22 DIAGNOSIS — J449 Chronic obstructive pulmonary disease, unspecified: Secondary | ICD-10-CM | POA: Diagnosis not present

## 2018-08-23 ENCOUNTER — Ambulatory Visit: Payer: Medicare Other | Admitting: Pharmacist

## 2018-08-23 DIAGNOSIS — R9430 Abnormal result of cardiovascular function study, unspecified: Secondary | ICD-10-CM

## 2018-08-23 DIAGNOSIS — E1111 Type 2 diabetes mellitus with ketoacidosis with coma: Secondary | ICD-10-CM | POA: Diagnosis not present

## 2018-08-23 DIAGNOSIS — E119 Type 2 diabetes mellitus without complications: Secondary | ICD-10-CM | POA: Diagnosis not present

## 2018-08-23 DIAGNOSIS — M79605 Pain in left leg: Secondary | ICD-10-CM

## 2018-08-23 NOTE — Chronic Care Management (AMB) (Signed)
Chronic Care Management   Note  08/23/2018 Name: HERIBERTO MOLE MRN: [id number] DOB: 15-May-1950  Subjective:   Does the patient  feel that his/her medications are working for him/her?  yes  Has the patient been experiencing any side effects to the medications prescribed?  yes  Does the patient measure his/her own blood glucose at home?  yes   Does the patient measure his/her own blood pressure at home? yes   Does the patient have any problems obtaining medications due to transportation or finances?   no  Understanding of regimen: fair Understanding of indications: fair Potential of compliance: fair  Objective: Lab Results  Component Value Date   CREATININE 0.87 07/30/2018   CREATININE 0.76 06/19/2018   CREATININE 0.86 06/18/2018    Lab Results  Component Value Date   HGBA1C 9.5 (A) 07/30/2018    Lipid Panel     Component Value Date/Time   CHOL 183 07/30/2018 1530   TRIG 270 (H) 07/30/2018 1530   HDL 37 (L) 07/30/2018 1530   CHOLHDL 4.9 07/30/2018 1530   LDLCALC 92 07/30/2018 1530    BP Readings from Last 3 Encounters:  07/30/18 126/74  06/19/18 103/60    Allergies  Allergen Reactions  . Heparin     Medications Reviewed Today    Reviewed by Andee Poles, [facility] (Pharmacist) on 08/23/18 at 1258  Med List Status: <None>  Medication Order Taking? Sig Documenting Provider Last Dose Status Informant  aspirin EC 81 MG EC tablet [id number] Yes Take 1 tablet (81 mg total) by mouth daily. Mayo, Allyn Kenner, MD Taking Active   Insulin Isophane & Regular Human (NOVOLIN 70/30 FLEXPEN RELION) (70-30) 100 UNIT/ML PEN [id number] Yes Inject 21 Units into the skin 2 (two) times daily with a meal. Mayo, Allyn Kenner, MD Taking Active            Med Note Andee Poles   Fri Aug 23, 2018 12:45 PM) 27 units pr Dr. Gershon Crane        Discontinued 08/23/18 1257 (Duplicate)   methimazole (TAPAZOLE) 10 MG tablet [id number] Yes Take 20 mg by mouth daily. [provider] Taking  Active Family Member  metoprolol succinate (TOPROL-XL) 50 MG 24 hr tablet [id number] No Take 1 tablet (50 mg total) by mouth daily. Take with or immediately following a meal.  Patient not taking:  Reported on 08/16/2018   MayoAllyn Kenner, MD Not Taking Active            Assessment:   Patient reports recent discharge from [facility], where he was just given "a handful of pills at a time". He is desiring better understanding of his health conditions, educations surrounding indications, mechanisms, and side effects for medications.   No DDI or other interactions noted by pharmacist at this time. Beta blockers can mask symptoms of hypoglycemia but patient is not taking metoprolol (by his own decision)  Goals Addressed            This Visit's Progress   . I want to know more about my medicines (pt-stated)       Current Barriers:  [name] Knowledge deficit related to purpose and mechanism of medications taken for diabetes  Pharmacist Clinical Goal(s): Over the 30 days, Mr. Pudwill will verbalize understanding of medication plan as it relates to his plan of care.   Interventions: . Patient educated on purpose, proper use and potential adverse effects of insulin, ASA 81mg , metoprolol.   . Pharmacist researched reasons patient was  previously taking metoprolol, currently taking ASA  Patient provided with an easy to understand medication guide made by CCM pharmacist  Pharmacist counseled patient on signs of hypoglycemia and what to do when BG is below 70 mg/dL  Patient Self Care Activities:  . Take all medications as prescribed . Contact provider for samples if prescription of Humulin 70/30 will run out before enrollment in assistance program  Initial goal documentation        Plan: Recommendations discussed with provider - research reasons patient was taking metoprolol, ASA - research [employer] program for Humulin 70/30  Recommendations discussed with patient - Ask Ricki Rodriguezdriana about  gabapentin and/or neurology referral at upcoming appointment   Follow up: The CM team will reach out to the patient again over the next 7 days.    Karalee HeightJulie Saahas Hidrogo, PharmD Clinical Pharmacist [facility] [phone]

## 2018-08-27 DIAGNOSIS — N183 Chronic kidney disease, stage 3 (moderate): Secondary | ICD-10-CM | POA: Diagnosis not present

## 2018-08-27 DIAGNOSIS — J449 Chronic obstructive pulmonary disease, unspecified: Secondary | ICD-10-CM | POA: Diagnosis not present

## 2018-08-27 DIAGNOSIS — E119 Type 2 diabetes mellitus without complications: Secondary | ICD-10-CM | POA: Diagnosis not present

## 2018-08-27 DIAGNOSIS — I509 Heart failure, unspecified: Secondary | ICD-10-CM | POA: Diagnosis not present

## 2018-08-27 DIAGNOSIS — L89154 Pressure ulcer of sacral region, stage 4: Secondary | ICD-10-CM | POA: Diagnosis not present

## 2018-08-27 DIAGNOSIS — I130 Hypertensive heart and chronic kidney disease with heart failure and stage 1 through stage 4 chronic kidney disease, or unspecified chronic kidney disease: Secondary | ICD-10-CM | POA: Diagnosis not present

## 2018-08-28 ENCOUNTER — Ambulatory Visit: Payer: Medicare Other | Admitting: Pharmacist

## 2018-08-28 DIAGNOSIS — E119 Type 2 diabetes mellitus without complications: Secondary | ICD-10-CM

## 2018-08-28 DIAGNOSIS — M79605 Pain in left leg: Secondary | ICD-10-CM

## 2018-08-28 DIAGNOSIS — E1111 Type 2 diabetes mellitus with ketoacidosis with coma: Secondary | ICD-10-CM | POA: Diagnosis not present

## 2018-08-28 DIAGNOSIS — R9430 Abnormal result of cardiovascular function study, unspecified: Secondary | ICD-10-CM

## 2018-08-29 ENCOUNTER — Encounter: Payer: Self-pay | Admitting: Physician Assistant

## 2018-08-29 ENCOUNTER — Ambulatory Visit (INDEPENDENT_AMBULATORY_CARE_PROVIDER_SITE_OTHER): Payer: Medicare Other | Admitting: Physician Assistant

## 2018-08-29 ENCOUNTER — Other Ambulatory Visit: Payer: Self-pay

## 2018-08-29 ENCOUNTER — Ambulatory Visit: Payer: Medicare Other

## 2018-08-29 VITALS — BP 119/71 | HR 87 | Temp 98.7°F | Wt 177.6 lb

## 2018-08-29 DIAGNOSIS — R29898 Other symptoms and signs involving the musculoskeletal system: Secondary | ICD-10-CM

## 2018-08-29 DIAGNOSIS — Z1211 Encounter for screening for malignant neoplasm of colon: Secondary | ICD-10-CM | POA: Diagnosis not present

## 2018-08-29 DIAGNOSIS — E1111 Type 2 diabetes mellitus with ketoacidosis with coma: Secondary | ICD-10-CM

## 2018-08-29 DIAGNOSIS — M79605 Pain in left leg: Secondary | ICD-10-CM | POA: Diagnosis not present

## 2018-08-29 DIAGNOSIS — E0590 Thyrotoxicosis, unspecified without thyrotoxic crisis or storm: Secondary | ICD-10-CM

## 2018-08-29 DIAGNOSIS — E119 Type 2 diabetes mellitus without complications: Secondary | ICD-10-CM | POA: Diagnosis not present

## 2018-08-29 DIAGNOSIS — R9430 Abnormal result of cardiovascular function study, unspecified: Secondary | ICD-10-CM | POA: Diagnosis not present

## 2018-08-29 MED ORDER — GABAPENTIN 300 MG PO CAPS: 300.0000 mg | ORAL_CAPSULE | Freq: Three times a day (TID) | ORAL | 0 refills | Status: DC

## 2018-08-29 MED ORDER — ATORVASTATIN CALCIUM 10 MG PO TABS: 10.0000 mg | ORAL_TABLET | Freq: Every day | ORAL | 1 refills | Status: DC

## 2018-08-29 MED ORDER — METOPROLOL SUCCINATE ER 25 MG PO TB24: 12.5000 mg | ORAL_TABLET | Freq: Every day | ORAL | 1 refills | Status: DC

## 2018-08-29 NOTE — Progress Notes (Signed)
Patient: Joseph Luna Male    DOB: November 01, 1950   68 y.o.   MRN: [id number] Visit Date: 08/29/2018  Today's Provider: Trey Sailors, PA-C   Chief Complaint  Patient presents with  . Diabetes   Subjective:    HPI  Diabetes Mellitus Type II, Follow-up:   He has been to see Dr. Gershon Crane with [facility] who is managing his insulin. He is currently working with the CCM team to help with medication affordability.   Lab Results  Component Value Date   HGBA1C 9.5 (A) 07/30/2018   HGBA1C >15.5 (H) 06/13/2018    Last seen for diabetes 1 months ago.  Management since then includes none. He reports good compliance with treatment. He is not having side effects.  Current symptoms include none and have been stable. Home blood sugar records: arranges:135-160  Episodes of hypoglycemia? no   Current Insulin Regimen: yes Most Recent Eye Exam:  Weight trend: stable Prior visit with dietician: No Current exercise: walking Current diet habits: well balanced  Pertinent Labs:    Component Value Date/Time   CHOL 183 07/30/2018 1530   TRIG 270 (H) 07/30/2018 1530   HDL 37 (L) 07/30/2018 1530   LDLCALC 92 07/30/2018 1530   CREATININE 0.87 07/30/2018 1530   CREATININE 0.97 05/04/2012 1008    Wt Readings from Last 3 Encounters:  08/29/18 177 lb 9.6 oz (80.6 kg)  07/30/18 170 lb (77.1 kg)  06/17/18 148 lb 2.4 oz (67.2 kg)   HLD: LDL above goal at 92. Not currently on a statin.   Lipid Panel     Component Value Date/Time   CHOL 183 07/30/2018 1530   TRIG 270 (H) 07/30/2018 1530   HDL 37 (L) 07/30/2018 1530   CHOLHDL 4.9 07/30/2018 1530   LDLCALC 92 07/30/2018 1530   Reduced EF: During his hospitalization for DKA, he had a troponin elevation which was thought to be due to demand ischemia. He underwent echocardiogram on 06/14/2018 which showed mildly reduced LV function with Ef of 40-45% with a mildly dilated cavity size and impared left ventricular  diastole. There was mild to moderate mitral valve regurgitation. He was placed on metoprolol XL 50 mg and was recommended to see cardiology as an outpatient. He has since stopped taking metoprolol as it was making his diastolic number drop into the 83'E. He denied symptoms att this time.   Left Leg Pain: Patient reports left leg pain radiating down from his knee and into his foot. He reports low back pain prior to his accident but reports this leg pain has worsened since then. He walks with assistance of a cane. He reports his pain worsens when sitting still and improves with walking. He reports he has difficulty lifting his foot up.  ------------------------------------------------------------------------    Allergies  Allergen Reactions  . Heparin   . Penicillin G Itching     Current Outpatient Medications:  .  aspirin EC 81 MG EC tablet, Take 1 tablet (81 mg total) by mouth daily., Disp: 30 tablet, Rfl: 0 .  Insulin Isophane & Regular Human (NOVOLIN 70/30 FLEXPEN RELION) (70-30) 100 UNIT/ML PEN, Inject 21 Units into the skin 2 (two) times daily with a meal., Disp: 15 mL, Rfl: 0 .  methimazole (TAPAZOLE) 10 MG tablet, Take 20 mg by mouth daily., Disp: , Rfl:  .  metoprolol succinate (TOPROL-XL) 50 MG 24 hr tablet, Take 1 tablet (50 mg total) by mouth daily. Take with or immediately  following a meal. (Patient not taking: Reported on 08/16/2018), Disp: 30 tablet, Rfl: 0  Review of Systems  Constitutional: Negative.   Respiratory: Negative.   Genitourinary: Negative.   Neurological: Negative.     Social History   Tobacco Use  . Smoking status: Current Every Day Smoker  . Smokeless tobacco: Never Used  Substance Use Topics  . Alcohol use: Not Currently      Objective:   BP 119/71 (BP Location: Left Arm, Patient Position: Sitting, Cuff Size: Normal)   Pulse 87   Temp 98.7 F (37.1 C) (Oral)   Wt 177 lb 9.6 oz (80.6 kg)   BMI 24.09 kg/m  Vitals:   08/29/18 1358  BP: 119/71    Pulse: 87  Temp: 98.7 F (37.1 C)  TempSrc: Oral  Weight: 177 lb 9.6 oz (80.6 kg)     Physical Exam Constitutional:      Appearance: Normal appearance.  Cardiovascular:     Rate and Rhythm: Normal rate and regular rhythm.     Heart sounds: Normal heart sounds.  Pulmonary:     Effort: Pulmonary effort is normal.     Breath sounds: Normal breath sounds.  Skin:    General: Skin is warm and dry.  Neurological:     Mental Status: He is alert and oriented to person, place, and time. Mental status is at baseline.  Psychiatric:        Mood and Affect: Mood normal.        Behavior: Behavior normal.         Assessment & Plan    1. Left leg weakness  Will start gabapentin as below and refer to physiatry for further evaluation.   - Ambulatory referral to Orthopedics - gabapentin (NEURONTIN) 300 MG capsule; Take 1 capsule (300 mg total) by mouth 3 (three) times daily.  Dispense: 270 capsule; Refill: 0  2. Left leg pain  - gabapentin (NEURONTIN) 300 MG capsule; Take 1 capsule (300 mg total) by mouth 3 (three) times daily.  Dispense: 270 capsule; Refill: 0  3. Type 2 diabetes mellitus with ketoacidotic coma, unspecified whether long term insulin use (HCC)  Start statin, refer to ophthalmology. He will get his pneumovax at the next visit.   - atorvastatin (LIPITOR) 10 MG tablet; Take 1 tablet (10 mg total) by mouth daily.  Dispense: 90 tablet; Refill: 1 - Ambulatory referral to Ophthalmology  4. Ejection fraction < 50%  He does have a reduced EF and is high risk for CVD with diabetes, smoking, male gender and age. Will restart him at low dose metorpolol XL as it sounds he was hypotensive on 50 mg. Will have him follow up with cardiology.   - metoprolol succinate (TOPROL-XL) 25 MG 24 hr tablet; Take 0.5 tablets (12.5 mg total) by mouth daily.  Dispense: 45 tablet; Refill: 1 - Ambulatory referral to Cardiology  5. Colon cancer screening  - Cologuard  The entirety of the  information documented in the History of Present Illness, Review of Systems and Physical Exam were personally obtained by me. Portions of this information were initially documented by [facility], CMA and reviewed by me for thoroughness and accuracy.   F/u 3 months for DM, HLD.      Trey SailorsAdriana M Devanee Pomplun, PA-C  [facility]

## 2018-08-29 NOTE — Patient Instructions (Signed)
Thank you allowing the Chronic Care Management Team to be a part of your care! It was a pleasure speaking with you today!  1. Continue to take you medication as prescribed. 2. Continue to check you blood sugars 3-4 times a day. Every morning before you eat, before lunch, and before dinner. 3. Continue to exercise every day. Daily exercise reduces your blood sugars and reduces your risk for stroke and heart attack (your risk is higher with diabetes and smoking) 4. Please consider completing the Advanced Directives (living will and health care power of attorney) that is included in this packet. You can call me if you have any question about completion 5. Please refer back to the education materials provided to you for reinforcement. 6. Please make an eye appointment AND dental appointment ASAP. If you have difficulty affording these please let me know. 7. You must quit smoking if you want to decrease your risk of Heart Attack and Stroke. You can call 1800 Quit Now for assistance  CCM (Chronic Care Management) Team   Yvone NeuPortia Dalasia Predmore RN, BSN Nurse Care Coordinator  [phone]  Karalee HeightJulie Hedrick PharmD  Clinical Pharmacist  [phone]   Verna Czechhrystal Land, LCSW Clinical Social Worker [phone]  Goals Addressed            This Visit's Progress   . I have not had any diabetes education (pt-stated)       Current Barriers:  [name]. Knowledge Deficits related to basic Diabetes pathophysiology and self care/management . Financial strain (lack of medication coverage)  Nurse Case Manager Clinical Goal(s):  [name]. Over the next 30 days, patient will demonstrate improved adherence to prescribed treatment plan for diabetes self care/management as evidenced by checking blood glucose levels as prescribed, adhering to ADA/low carb diet, daily exercise, and medication adherence  Interventions:  . Assessed need for education reinforcement . Assessed for medication adherence . Discussed plans with patient  for ongoing care management follow up and provided patient with direct contact information for care management team . Reviewed recent CBG readings . Provided reassurance and emotional support as patient is fearful he be hospitalized again for DKA if sugars are elevated. . Advised patient, providing education and rationale, to check cbg TID and record, calling Dr. Gershon Crane'Connell for findings outside established parameters.    Patient Self Care Activities:  . Self administers medications as prescribed . Attends all scheduled provider appointments . Checks blood sugars as prescribed and utilize hyper and hypoglycemia protocol as needed . Adhere to ADA diet as discussed . Makes eye and dental appointments ASAP . Engage with CCM Clinic Pharmacist for ongoing medication management and affordibility  Plan:  . RN CM will follow up with patient in 30 days to assess goal   Please see past updates related to this goal by clicking on the "Past Updates" button in the selected goal      . My left leg will give out on me if I am not careful (pt-stated)       Current Barriers:  [name]. Knowledge Deficits related to fall precautions . Nerve damage left leg .  Nurse Case Manager Clinical Goal(s):  [name]. Over the next 14 days, patient will demonstrate improved adherence to prescribed treatment plan for decreasing falls as evidenced by patient reporting and review of EMR . Over the next 14 days, patient will verbalize using fall risk reduction strategies discussed . Over the next 30 days, patient will not experience falls .   Interventions:  . Discussed  todays PCP visit regarding neuropathy left leg . Assessed for understanding of newly prescribed medications including side effects (gabapentin) . Assessed for recent falls . Assessed patients knowledge of fall risk prevention    Patient Self Care Activities:  . Utilize Rolator or Cane appropriately with all ambulation . De-clutter walkways . Change positions  slowly . Wear secure fitting shoes at all times with ambulation . Utilize home lighting for dim lit areas  Plan: . CCM RN CM will follow up with patient next week for assessment of effectiveness of gabapentin   Please see past updates in goals section for documentation of goal progression         The patient verbalized understanding of instructions provided today and declined a print copy of patient instruction materials.   Telephone follow up appointment with CCM team member scheduled for: next week

## 2018-08-29 NOTE — Patient Instructions (Addendum)
Start with 300 mg gabapentin three times daily. If you want to increase medication, add one tablet to each dose every three days.   Need pneumonia shot, we will do it at the next visit. This is called "Pneumovax."   Diabetes Mellitus and Exercise Exercising regularly is important for your overall health, especially when you have diabetes (diabetes mellitus). Exercising is not only about losing weight. It has many other health benefits, such as increasing muscle strength and bone density and reducing body fat and stress. This leads to improved fitness, flexibility, and endurance, all of which result in better overall health. Exercise has additional benefits for people with diabetes, including:  Reducing appetite.  Helping to lower and control blood glucose.  Lowering blood pressure.  Helping to control amounts of fatty substances (lipids) in the blood, such as cholesterol and triglycerides.  Helping the body to respond better to insulin (improving insulin sensitivity).  Reducing how much insulin the body needs.  Decreasing the risk for heart disease by: ? Lowering cholesterol and triglyceride levels. ? Increasing the levels of good cholesterol. ? Lowering blood glucose levels. What is my activity plan? Your health care provider or certified diabetes educator can help you make a plan for the type and frequency of exercise (activity plan) that works for you. Make sure that you:  Do at least 150 minutes of moderate-intensity or vigorous-intensity exercise each week. This could be brisk walking, biking, or water aerobics. ? Do stretching and strength exercises, such as yoga or weightlifting, at least 2 times a week. ? Spread out your activity over at least 3 days of the week.  Get some form of physical activity every day. ? Do not go more than 2 days in a row without some kind of physical activity. ? Avoid being inactive for more than 30 minutes at a time. Take frequent breaks to walk or  stretch.  Choose a type of exercise or activity that you enjoy, and set realistic goals.  Start slowly, and gradually increase the intensity of your exercise over time. What do I need to know about managing my diabetes?   Check your blood glucose before and after exercising. ? If your blood glucose is 240 mg/dL (24.4 mmol/L) or higher before you exercise, check your urine for ketones. If you have ketones in your urine, do not exercise until your blood glucose returns to normal. ? If your blood glucose is 100 mg/dL (5.6 mmol/L) or lower, eat a snack containing 15-20 grams of carbohydrate. Check your blood glucose 15 minutes after the snack to make sure that your level is above 100 mg/dL (5.6 mmol/L) before you start your exercise.  Know the symptoms of low blood glucose (hypoglycemia) and how to treat it. Your risk for hypoglycemia increases during and after exercise. Common symptoms of hypoglycemia can include: ? Hunger. ? Anxiety. ? Sweating and feeling clammy. ? Confusion. ? Dizziness or feeling light-headed. ? Increased heart rate or palpitations. ? Blurry vision. ? Tingling or numbness around the mouth, lips, or tongue. ? Tremors or shakes. ? Irritability.  Keep a rapid-acting carbohydrate snack available before, during, and after exercise to help prevent or treat hypoglycemia.  Avoid injecting insulin into areas of the body that are going to be exercised. For example, avoid injecting insulin into: ? The arms, when playing tennis. ? The legs, when jogging.  Keep records of your exercise habits. Doing this can help you and your health care provider adjust your diabetes management plan as needed.  Write down: ? Food that you eat before and after you exercise. ? Blood glucose levels before and after you exercise. ? The type and amount of exercise you have done. ? When your insulin is expected to peak, if you use insulin. Avoid exercising at times when your insulin is  peaking.  When you start a new exercise or activity, work with your health care provider to make sure the activity is safe for you, and to adjust your insulin, medicines, or food intake as needed.  Drink plenty of water while you exercise to prevent dehydration or heat stroke. Drink enough fluid to keep your urine clear or pale yellow. Summary  Exercising regularly is important for your overall health, especially when you have diabetes (diabetes mellitus).  Exercising has many health benefits, such as increasing muscle strength and bone density and reducing body fat and stress.  Your health care provider or certified diabetes educator can help you make a plan for the type and frequency of exercise (activity plan) that works for you.  When you start a new exercise or activity, work with your health care provider to make sure the activity is safe for you, and to adjust your insulin, medicines, or food intake as needed. This information is not intended to replace advice given to you by your health care provider. Make sure you discuss any questions you have with your health care provider. Document Released: 06/24/2003 Document Revised: 10/12/2016 Document Reviewed: 09/13/2015 Elsevier Interactive Patient Education  2019 [employer]

## 2018-08-29 NOTE — Chronic Care Management (AMB) (Signed)
Chronic Care Management   Follow Up Note   08/29/2018 Name: Joseph Luna MRN: [id number] DOB: October 25, 1950  Referred by: Trey SailorsPollak, [name] M, PA-C Reason for referral : Chronic Care Management (follow up DM)   Subjective: "I think I am doing OK but this leg is giving me a fit"   Objective:  Lab Results  Component Value Date   HGBA1C 9.5 (A) 07/30/2018    Assessment: Joseph Luna a [age]year old malewho seesAdriana Jodi MarblePollak, PA-Cfor primary care. Ms. Beecher Mcardleollakasked the CCM team to consult the patient forchronic case management secondary to his need for DM management.Referral was placed4/14/2020 during last office visit.  Joseph Luna was recently diagnoses with diabetes after sustaining a fall at home while in DKA. He was in ICU, intubated and after a long hospitalization, discharged to [facility] for rehab. He was discharged mid March to home with home health RN and PT. He has completed his PT however Nursing continues to see Joseph Luna to address pressure wound care.  Today CCM RN CM followed up with Joseph Luna to make sure he received all education materials and to assess for questions or concerns.  Review of patient status, including review of consultants reports, relevant laboratory and other test results, and collaboration with appropriate care team members and the patient's provider was performed as part of comprehensive patient evaluation and provision of chronic care management services.    Goals Addressed            This Visit's Progress    I have not had any diabetes education (pt-stated)       Joseph Luna is doing very well monitoring his blood sugar and maintaining an ADA diet. He is reading food label and being very mindful not to eat more than 45 grams of carbs/meal. He is extremely fearful that he may experience another DKA episode so he is checking his sugars up to 6 times a day. CCM RN CM provided emotional support and addressed patients fears. He is attempting  to return to the "normal" activities he was doing prior to his hospitalization. He has plans to go white water rafting with his friend later this month. He is doing well with medication compliance.   Current Barriers:   Knowledge Deficits related to basic Diabetes pathophysiology and self care/management  Financial strain (lack of medication coverage)  Nurse Case Manager Clinical Goal(s):   Over the next 30 days, patient will demonstrate improved adherence to prescribed treatment plan for diabetes self care/management as evidenced by checking blood glucose levels as prescribed, adhering to ADA/low carb diet, daily exercise, and medication adherence  Interventions:   Assessed need for education reinforcement  Assessed for medication adherence  Discussed plans with patient for ongoing care management follow up and provided patient with direct contact information for care management team  Reviewed recent CBG readings  Provided reassurance and emotional support as patient is fearful he be hospitalized again for DKA if sugars are elevated.  Advised patient, providing education and rationale, to check cbg TID and record, calling Dr. Gershon Crane'Connell for findings outside established parameters.    Patient Self Care Activities:   Self administers medications as prescribed  Attends all scheduled provider appointments  Checks blood sugars as prescribed and utilize hyper and hypoglycemia protocol as needed  Adhere to ADA diet as discussed  Makes eye and dental appointments ASAP  Engage with CCM Clinic Pharmacist for ongoing medication management and affordibility  Plan:   RN CM will follow up with  patient in 30 days to assess goal   Please see past updates related to this goal by clicking on the "Past Updates" button in the selected goal       My left leg will give out on me if I am not careful (pt-stated)       Today Joseph Luna presented to his PCP with complaints of ongoing pain and  numbness in his left leg. This may be related to his recent fall or neuropathy secondary to DKA and critical condition in 05/2018. Today he has been prescribed Neurotin for neuropathy. He continues to be cautioned on his fall risk and home/ambulation safety. He continues to utilize his Rolator or cane with short distances.   Current Barriers:   Knowledge Deficits related to fall precautions  Nerve damage left leg   Nurse Case Manager Clinical Goal(s):   Over the next 14 days, patient will demonstrate improved adherence to prescribed treatment plan for decreasing falls as evidenced by patient reporting and review of EMR  Over the next 14days, patient will verbalize using fall risk reduction strategies discussed  Over the next 30 days, patient will not experience falls    Interventions:   Discussed todays PCP visit regarding neuropathy left leg  Assessed for understanding of newly prescribed medications including side effects (gabapentin)  Assessed for recent falls  Assessed patients knowledge of fall risk prevention    Patient Self Care Activities:   Utilize Rolator or [place] appropriately with all ambulation  De-clutter walkways  Change positions slowly  Wear secure fitting shoes at all times with ambulation  Utilize home lighting for dim lit areas  Plan:  CCM RN CM will follow up with patient next week for assessment of effectiveness of gabapentin   Please see past updates in goals section for documentation of goal progression          Telephone follow up appointment with CCM team member scheduled for: 1 week to access for effectiveness of gabapentin  Joseph Ziebell E. Suzie Portela, RN, BSN Nurse Care Coordinator [facility] [phone]

## 2018-08-30 DIAGNOSIS — N183 Chronic kidney disease, stage 3 (moderate): Secondary | ICD-10-CM | POA: Diagnosis not present

## 2018-08-30 DIAGNOSIS — J449 Chronic obstructive pulmonary disease, unspecified: Secondary | ICD-10-CM | POA: Diagnosis not present

## 2018-08-30 DIAGNOSIS — I130 Hypertensive heart and chronic kidney disease with heart failure and stage 1 through stage 4 chronic kidney disease, or unspecified chronic kidney disease: Secondary | ICD-10-CM | POA: Diagnosis not present

## 2018-08-30 DIAGNOSIS — E119 Type 2 diabetes mellitus without complications: Secondary | ICD-10-CM | POA: Diagnosis not present

## 2018-08-30 DIAGNOSIS — I509 Heart failure, unspecified: Secondary | ICD-10-CM | POA: Diagnosis not present

## 2018-08-30 DIAGNOSIS — L89154 Pressure ulcer of sacral region, stage 4: Secondary | ICD-10-CM | POA: Diagnosis not present

## 2018-08-30 NOTE — Chronic Care Management (AMB) (Signed)
  Chronic Care Management   Follow Up Note   08/30/2018 Name: Joseph Luna MRN: [id number] DOB: [birth year]  Referred by: Trey Sailors, PA-C Reason for referral : Chronic Care Management (Pharmacy follow up- gabapentin, Humulin)   Almira Coaster is a 68 y.o. year old male who is a primary care patient of Trey Sailors, [place]. The CCM team was consulted for assistance with chronic disease management and care coordination needs.    Review of patient status, including review of consultants reports, relevant laboratory and other test results, and collaboration with appropriate care team members and the patient's provider was performed as part of comprehensive patient evaluation and provision of chronic care management services.    Goals Addressed            This Visit's Progress   . I want to know more about my medicines (pt-stated)       Current Barriers:  [name] Knowledge deficit related to purpose and mechanism of medications taken for diabetes  Pharmacist Clinical Goal(s): Over the 30 days, Joseph Luna will verbalize understanding of medication plan as it relates to his plan of care.   Interventions: . Patient educated on purpose, proper use and potential adverse effects of insulin, ASA 81mg , metoprolol.   . Pharmacist researched reasons patient was previously taking metoprolol, currently taking ASA  Patient provided with an easy to understand medication guide made by CCM pharmacist  Pharmacist counseled patient on signs of hypoglycemia and what to do when BG is below 70 mg/dL   CCM pharmacist provided patient with [facility] application for Humulin 70/30, will fax provider portion to Dr. Gershon Crane  Patient Self Care Activities:  . Take all medications as prescribed . Contact provider for samples if prescription of Humulin 70/30 will run out before enrollment in assistance program  Please see past updates related to this goal by clicking on the "Past Updates" button in the  selected goal         Plan: Mail patient portion of [employer] Cares application to patient, fax provider portion to Dr. Gershon Crane Alternatives for metoprolol succinate due to cost   Follow up: Telephone follow up appointment with CCM team member scheduled for: 2 weeks for assistance application and f/u with [name] regarding metoprolol alternatives   Karalee Height, PharmD Clinical Pharmacist [facility] [phone]

## 2018-09-12 ENCOUNTER — Telehealth: Payer: Self-pay

## 2018-09-13 ENCOUNTER — Telehealth: Payer: Self-pay

## 2018-09-16 ENCOUNTER — Telehealth: Payer: Self-pay

## 2018-09-16 DIAGNOSIS — M5136 Other intervertebral disc degeneration, lumbar region: Secondary | ICD-10-CM | POA: Diagnosis not present

## 2018-09-16 DIAGNOSIS — M25572 Pain in left ankle and joints of left foot: Secondary | ICD-10-CM | POA: Diagnosis not present

## 2018-09-16 DIAGNOSIS — M5416 Radiculopathy, lumbar region: Secondary | ICD-10-CM | POA: Diagnosis not present

## 2018-09-18 DIAGNOSIS — M79652 Pain in left thigh: Secondary | ICD-10-CM | POA: Diagnosis not present

## 2018-09-18 DIAGNOSIS — R208 Other disturbances of skin sensation: Secondary | ICD-10-CM | POA: Diagnosis not present

## 2018-09-19 DIAGNOSIS — Z794 Long term (current) use of insulin: Secondary | ICD-10-CM | POA: Diagnosis not present

## 2018-09-19 DIAGNOSIS — E0590 Thyrotoxicosis, unspecified without thyrotoxic crisis or storm: Secondary | ICD-10-CM | POA: Diagnosis not present

## 2018-09-19 DIAGNOSIS — E1165 Type 2 diabetes mellitus with hyperglycemia: Secondary | ICD-10-CM | POA: Diagnosis not present

## 2018-09-23 ENCOUNTER — Ambulatory Visit: Payer: Medicare Other | Admitting: Pharmacist

## 2018-09-23 DIAGNOSIS — R9430 Abnormal result of cardiovascular function study, unspecified: Secondary | ICD-10-CM

## 2018-09-23 DIAGNOSIS — R208 Other disturbances of skin sensation: Secondary | ICD-10-CM | POA: Insufficient documentation

## 2018-09-23 DIAGNOSIS — E1111 Type 2 diabetes mellitus with ketoacidosis with coma: Secondary | ICD-10-CM

## 2018-09-23 DIAGNOSIS — R200 Anesthesia of skin: Secondary | ICD-10-CM | POA: Insufficient documentation

## 2018-09-23 DIAGNOSIS — M79652 Pain in left thigh: Secondary | ICD-10-CM | POA: Insufficient documentation

## 2018-09-23 NOTE — Chronic Care Management (AMB) (Signed)
  Chronic Care Management   Note  09/23/2018 Name: Joseph Luna MRN: [id number] DOB: April 17, 1951  Subjective:  #Gabapentin: patient states that the gabapentin (new rx- 300mg  TID) did well at managing his leg pain at first but gradually wore off. Yesterday he increased to 600mg  TID and that worked well. Strongly cautioned patient against increasing dose and he states that Joseph Luna increased it. I do not see this in her documentation.   #Metoprolol: discussed with Joseph Luna  #Humulin: Patient dropped off completed Scientist, forensic for pharmacist Assessment:  Goals Addressed            This Visit's Progress   . I want to know more about my medicines (pt-stated)       Current Barriers:  [name] Knowledge deficit related to purpose and mechanism of medications taken for diabetes  Pharmacist Clinical Goal(s): Over the 30 days, Joseph Luna will verbalize understanding of medication plan as it relates to his plan of care.   Interventions: . Patient educated on purpose, proper use and potential adverse effects of insulin, ASA 81mg , metoprolol.   . Pharmacist researched reasons patient was previously taking metoprolol, currently taking ASA  Patient provided with an easy to understand medication guide made by CCM pharmacist  Pharmacist counseled patient on signs of hypoglycemia and what to do when BG is below 70 mg/dL   CCM pharmacist provided patient with [facility] application for Humulin 70/30, will fax provider portion to Joseph Luna   Updated, 09/23/18: provided counseling on gabepentin, patient increased dose to 600mg  TID; faxed provider portion of Lilly application to Joseph Luna. Joseph Luna okay with metoprolol succinate --> metoprolol tartrate for cost benefit; metoprolol tartrate 25mg  1/2 tablet BID    Patient Self Care Activities:  . Take all medications as prescribed . Contact provider for samples if prescription of Humulin 70/30 will run out before enrollment in assistance program   Please see past updates related to this goal by clicking on the "Past Updates" button in the selected goal         Plan: Recommendations discussed with provider - Change metoprolol succinate 25mg  1/2 tablet daily to metoprolol tartrate 1/2 tablet BID for cost savings  Recommendations discussed with patient - Please be aware of sleepiness due to gabapentin   Follow up: Telephone follow up appointment with care management team member scheduled for: 10 days with PharmD  Care coordination with [facility] pending fax from Joseph Luna office  Joseph Luna, PharmD Clinical Pharmacist [place] [phone]

## 2018-10-02 DIAGNOSIS — E119 Type 2 diabetes mellitus without complications: Secondary | ICD-10-CM | POA: Diagnosis not present

## 2018-10-02 LAB — HM DIABETES EYE EXAM

## 2018-10-07 ENCOUNTER — Ambulatory Visit: Payer: Self-pay | Admitting: Pharmacist

## 2018-10-07 ENCOUNTER — Telehealth: Payer: Medicare Other

## 2018-10-07 DIAGNOSIS — M5136 Other intervertebral disc degeneration, lumbar region: Secondary | ICD-10-CM | POA: Diagnosis not present

## 2018-10-07 DIAGNOSIS — M25572 Pain in left ankle and joints of left foot: Secondary | ICD-10-CM | POA: Diagnosis not present

## 2018-10-07 DIAGNOSIS — M5416 Radiculopathy, lumbar region: Secondary | ICD-10-CM | POA: Diagnosis not present

## 2018-10-07 DIAGNOSIS — R9430 Abnormal result of cardiovascular function study, unspecified: Secondary | ICD-10-CM

## 2018-10-07 DIAGNOSIS — E1111 Type 2 diabetes mellitus with ketoacidosis with coma: Secondary | ICD-10-CM

## 2018-10-07 MED ORDER — METOPROLOL TARTRATE 25 MG PO TABS: 12.5000 mg | ORAL_TABLET | Freq: Two times a day (BID) | ORAL | 3 refills | Status: DC

## 2018-10-07 NOTE — Chronic Care Management (AMB) (Signed)
  Chronic Care Management   Follow Up Note   10/07/2018 Name: Joseph Luna MRN: [id number] DOB: Dec 13, 1950  Referred by: Trinna Post, PA-C Reason for referral : Chronic Care Management (Follow up- Lilly Cares, metoprolol) and Care Coordination   Joseph Luna is a 68 y.o. year old male who is a primary care patient of Paulene Floor. The CCM team was consulted for assistance with chronic disease management and care coordination needs.    Review of patient status, including review of consultants reports, relevant laboratory and other test results, and collaboration with appropriate care team members and the patient's provider was performed as part of comprehensive patient evaluation and provision of chronic care management services.    Goals Addressed            This Visit's Progress   . I want to know more about my medicines (pt-stated)       Current Barriers:  [name] Knowledge deficit related to purpose and mechanism of medications taken for diabetes  Pharmacist Clinical Goal(s): Over the 30 days, Joseph Luna will verbalize understanding of medication plan as it relates to his plan of care.   Interventions: . Patient educated on purpose, proper use and potential adverse effects of insulin, ASA 81mg , metoprolol.   . Pharmacist researched reasons patient was previously taking metoprolol, currently taking ASA  Patient provided with an easy to understand medication guide made by CCM pharmacist  Pharmacist counseled patient on signs of hypoglycemia and what to do when BG is below 70 mg/dL   CCM pharmacist provided patient with [facility] application for Humulin 70/30, will fax provider portion to Dr. Honor Junes   Updated, 09/23/18: provided counseling on gabepentin, patient increased dose to 600mg  TID; faxed provider portion of Lilly application to Dr. Ladell Pier. [name] okay with metoprolol succinate --> metoprolol tartrate for cost benefit; metoprolol tartrate 25mg  1/2 tablet  BID   Updated, 10/07/18: [name] sent in metoprolol tartate 25mg  1/2 tablet BID   Patient Self Care Activities:  . Take all medications as prescribed . Contact provider for samples if prescription of Humulin 70/30 will run out before enrollment in assistance program  Please see past updates related to this goal by clicking on the "Past Updates" button in the selected goal         Follow up: A HIPPA compliant phone message was left for the patient providing contact information and requesting a return call.  Telephone follow up appointment with care management team member scheduled for: Thursday, June 25  Ruben Reason, PharmD Clinical Pharmacist [place] [phone]

## 2018-10-11 ENCOUNTER — Ambulatory Visit (INDEPENDENT_AMBULATORY_CARE_PROVIDER_SITE_OTHER): Payer: Medicare Other | Admitting: Pharmacist

## 2018-10-11 DIAGNOSIS — E119 Type 2 diabetes mellitus without complications: Secondary | ICD-10-CM

## 2018-10-11 DIAGNOSIS — E1111 Type 2 diabetes mellitus with ketoacidosis with coma: Secondary | ICD-10-CM

## 2018-10-11 NOTE — Chronic Care Management (AMB) (Signed)
  Chronic Care Management   Follow Up Note   10/11/2018 Name: Joseph Luna MRN: [id number] DOB: [birth year]  Referred by: Trinna Post, PA-C Reason for referral : Chronic Care Management (Humulin assistance)  Subjective Joseph Luna is a 68 y.o. year old male who is a primary care patient of Trinna Post, [place]. The CCM clinical pharmacist contacted the patient today regarding Lilly Cares application  Assessment: Received Dr. Phylliss Blakes portion of [facility] application. Submitted application and uploaded copy under media tab. Approval and refills process will all go through [facility] endocrine and Dr. Hilton Sinclair.   Contacted patient to make him aware of where we are in process. He will receive a letter in the mail from [employer] regarding his approval and be contacted via telephone from [facility] to schedule his delivery of insulin. If he needs refills in the future, he needs to contact Dr. Ladell Pier and [facility], NOT Almyra Free or [facility]. Patient verbalized understanding.   Goals Addressed            This Visit's Progress   . I want to know more about my medicines (pt-stated)       Current Barriers:  [name] Knowledge deficit related to purpose and mechanism of medications taken for diabetes  Pharmacist Clinical Goal(s): Over the 30 days, Mr. Blazejewski will verbalize understanding of medication plan as it relates to his plan of care.   Interventions: . Patient educated on purpose, proper use and potential adverse effects of insulin, ASA 81mg , metoprolol.   . Pharmacist researched reasons patient was previously taking metoprolol, currently taking ASA  Patient provided with an easy to understand medication guide made by CCM pharmacist  Pharmacist counseled patient on signs of hypoglycemia and what to do when BG is below 70 mg/dL   CCM pharmacist provided patient with [facility] application for Humulin 70/30, will fax provider portion to Dr. Honor Junes    CCM  pharmacist submitted application to [facility]; approval and refills process will go through [facility]   Patient Self Care Activities:  . Take all medications as prescribed . Contact provider for samples if prescription of Humulin 70/30 will run out before enrollment in assistance program  Please see past updates related to this goal by clicking on the "Past Updates" button in the selected goal         Follow up: Telephone follow up appointment with care management team member scheduled for: 30 days as scheduled with PharmD for med mgmt   Ruben Reason, PharmD Clinical Pharmacist [place] [phone]

## 2018-10-26 NOTE — Progress Notes (Signed)
Cardiology Office Note  Date:  10/29/2018   ID:  Joseph Luna, DOB 01/10/1951, MRN [id number]  PCP:  Joseph Post, PA-C   Chief Complaint  Patient presents with  . Other    Per Dr. Terrilee Luna for EF. Meds reviewed verbally with patient.     HPI:  Mr. Joseph Luna is a 68 year old gentleman with past medical history of Smoker, COPD Poorly controlled diabetes type 2 In the hospital 05/2018 Elevated troponin, 0.06, 0.22, 1.14, 4.78,  DKA with severe metabolic acidosis with rhabdomyolysis, and acute hypoxic respiratory failure due to aspiration pneumonia Protein-calorie malnutrition, severe Referred by Joseph Luna for cardiomyopathy on echo 05/2018  Recent hospital admission reviewed with him in detail Echo 06/14/2018 reviewed with him  1. The left ventricle has mild-moderately reduced systolic function, with an ejection fraction of 40-45%. The cavity size was mildly dilated. Left ventricular diastolic Doppler parameters are consistent with impaired relaxation.  2. The right ventricle has normal systolic function. The cavity was normal. There is no increase in right ventricular wall thickness.  3. The mitral valve is normal in structure. Mitral valve regurgitation is mild to moderate by color flow Doppler.  He reports that he feels relatively well, sedentary, walks with a cane Has some chronic baseline shortness of breath Denies having significant chest pain Weight trending back upwards over the past 6 months  Discussed his family history with him Father cad, smoker  EKG personally reviewed by myself on todays visit Shows normal sinus rhythm with rate 72 bpm no significant ST or T wave changes  PMH:   has a past medical history of Arthritis, Diabetes mellitus without complication ([place]), and Thyroid disease.  PSH:    Past Surgical History:  Procedure Laterality Date  . APPENDECTOMY    . none      Current Outpatient Medications  Medication Sig Dispense Refill  . aspirin EC  81 MG EC tablet Take 1 tablet (81 mg total) by mouth daily. 30 tablet 0  . atorvastatin (LIPITOR) 10 MG tablet Take 1 tablet (10 mg total) by mouth daily. 90 tablet 1  . gabapentin (NEURONTIN) 300 MG capsule Take 1 capsule (300 mg total) by mouth 3 (three) times daily. 270 capsule 0  . Insulin Isophane & Regular Human (NOVOLIN 70/30 FLEXPEN RELION) (70-30) 100 UNIT/ML PEN Inject 21 Units into the skin 2 (two) times daily with a meal. 15 mL 0  . methimazole (TAPAZOLE) 10 MG tablet Take 15 mg by mouth daily.     . metoprolol tartrate (LOPRESSOR) 25 MG tablet Take 0.5 tablets (12.5 mg total) by mouth 2 (two) times daily. 180 tablet 3   No current facility-administered medications for this visit.      Allergies:   Heparin and Penicillin g   Social History:  The patient  reports that he has been smoking. He has never used smokeless tobacco. He reports previous alcohol use. He reports that he does not use drugs.   Family History:   family history includes Cancer in his mother and sister; Heart failure in his father.    Review of Systems: Review of Systems  Constitutional: Negative.   HENT: Negative.   Respiratory: Positive for shortness of breath.   Cardiovascular: Negative.   Gastrointestinal: Negative.   Musculoskeletal: Negative.   Neurological: Negative.   Psychiatric/Behavioral: Negative.   All other systems reviewed and are negative.   PHYSICAL EXAM: VS:  BP 123/69 (BP Location: Left Arm, Patient Position: Sitting, Cuff Size: Normal)  Pulse 72   Ht 6\' 1"  (1.854 m)   Wt 185 lb (83.9 kg)   BMI 24.41 kg/m  , BMI Body mass index is 24.41 kg/m. GEN: Well nourished, well developed, in no acute distress HEENT: normal Neck: no JVD, carotid bruits, or masses Cardiac: RRR; no murmurs, rubs, or gallops,no edema  Respiratory: Moderately decreased breath sounds throughout, normal work of breathing GI: soft, nontender, nondistended, + BS MS: no deformity or atrophy Skin: warm and  dry, no rash Neuro:  Strength and sensation are intact Psych: euthymic mood, full affect   Recent Labs: 06/19/2018: Magnesium 2.1 07/30/2018: ALT 8; BUN 15; Creatinine, Ser 0.87; Hemoglobin 13.1; Platelets 246; Potassium 4.1; Sodium 142; TSH 1.420    Lipid Panel Lab Results  Component Value Date   CHOL 183 07/30/2018   HDL 37 (L) 07/30/2018   LDLCALC 92 07/30/2018   TRIG 270 (H) 07/30/2018      Wt Readings from Last 3 Encounters:  10/29/18 185 lb (83.9 kg)  08/29/18 177 lb 9.6 oz (80.6 kg)  07/30/18 170 lb (77.1 kg)       ASSESSMENT AND PLAN:  Problem List Items Addressed This Visit    Type 2 diabetes mellitus with diabetic neuropathy (HCC)   Protein-calorie malnutrition, severe    Other Visit Diagnoses    Dilated cardiomyopathy (HCC)    -  Primary   Relevant Orders   EKG 12-Lead   Non-ST elevation (NSTEMI) myocardial infarction (HCC)         Cardiomyopathy Low ejection fraction 40 to 45% in the hospital admission February 2020 We have offered repeat echocardiogram and stress testing Discussed each test in detail,  He would like to think about whether he would like any further testing He would be high risk of underlying coronary disease, we have suggested he consider the pharmacologic Myoview as he is unable to treadmill  Smoking We have encouraged him to continue to work on weaning his cigarettes and smoking cessation. He will continue to work on this and does not want any assistance with chantix.   Hyperlipidemia Ideally goal LDL less than 70 Would benefit from increased dose Lipitor, possibly adding Zetia He will talk with primary care  Non-STEMI Likely underlying coronary disease, we have recommended pharmacologic Myoview Reports he is currently asymptomatic and will call us back if he would like to schedule any studies  Type 2 diabetes Managed by primary care Hemoglobin A1c 9.5 Discussed the need to get this number lower A1c is likely higher given  recent 15 pound weight gain in the past several months   Disposition:   He will call us if he would like any further testing   Total encounter time more than 45 minutes  Greater than 50% was spent in counseling and coordination of care with the patient    Signed, Dossie Arbourim Xsavier Seeley, M.D., Ph.D. [facility], [place] [phone]

## 2018-10-29 ENCOUNTER — Ambulatory Visit (INDEPENDENT_AMBULATORY_CARE_PROVIDER_SITE_OTHER): Payer: Medicare Other | Admitting: Cardiovascular Disease

## 2018-10-29 ENCOUNTER — Encounter: Payer: Self-pay | Admitting: Cardiovascular Disease

## 2018-10-29 ENCOUNTER — Other Ambulatory Visit: Payer: Self-pay

## 2018-10-29 VITALS — BP 123/69 | HR 72 | Ht 73.0 in | Wt 185.0 lb

## 2018-10-29 DIAGNOSIS — E1140 Type 2 diabetes mellitus with diabetic neuropathy, unspecified: Secondary | ICD-10-CM | POA: Diagnosis not present

## 2018-10-29 DIAGNOSIS — E43 Unspecified severe protein-calorie malnutrition: Secondary | ICD-10-CM | POA: Diagnosis not present

## 2018-10-29 DIAGNOSIS — I420 Dilated cardiomyopathy: Secondary | ICD-10-CM | POA: Diagnosis not present

## 2018-10-29 DIAGNOSIS — I214 Non-ST elevation (NSTEMI) myocardial infarction: Secondary | ICD-10-CM

## 2018-10-29 NOTE — Patient Instructions (Signed)
Research: 1) lexiscan myoview: in the hospital lobby: Takes 2 hours, looks for blockages Looks at [facility]  Or/and  2) echocardiogram looks at heart pump ejection fraction    Medication Instructions:  No changes  If you need a refill on your cardiac medications before your next appointment, please call your pharmacy.    Lab work: No new labs needed   If you have labs (blood work) drawn today and your tests are completely normal, you will receive your results only by: [name] MyChart Message (if you have MyChart) OR . A paper copy in the mail If you have any lab test that is abnormal or we need to change your treatment, we will call you to review the results.   Testing/Procedures: No new testing needed   Follow-Up: At [facility], you and your health needs are our priority.  As part of our continuing mission to provide you with exceptional heart care, we have created designated Provider Care Teams.  These Care Teams include your primary Cardiologist (physician) and Advanced Practice Providers (APPs -  Physician Assistants and Nurse Practitioners) who all work together to provide you with the care you need, when you need it.  . You will need a follow up appointment as needed  . Providers on your designated Care Team:   . Murray Hodgkins, NP . Christell Faith, PA-C . Marrianne Mood, PA-C  Any Other Special Instructions Will Be Listed Below (If Applicable).  For educational health videos Log in to : [email or web address] Or : [email or web address], password : [email or web address]

## 2018-11-12 ENCOUNTER — Ambulatory Visit: Payer: Self-pay | Admitting: Pharmacist

## 2018-11-12 DIAGNOSIS — E1111 Type 2 diabetes mellitus with ketoacidosis with coma: Secondary | ICD-10-CM

## 2018-11-12 DIAGNOSIS — E119 Type 2 diabetes mellitus without complications: Secondary | ICD-10-CM

## 2018-11-12 NOTE — Chronic Care Management (AMB) (Signed)
  Chronic Care Management   Follow Up Note   11/12/2018 Name: Joseph Luna MRN: [id number] DOB: [birth year]   Subjective Joseph Luna is a 68 y.o. year old male who is a primary care patient of Trinna Post, [place]. The CCM clinical pharmacist received incoming call today from patient regarding insulin from [facility]. CCM pharmacist spent >60 minutes in care coordination and returned call to patient.   Assessment: Corrected issue with [facility] in patient's profile (mail order pharmacy fulfilling [employer] prescriptions). Patient should receive phone call scheduling shipment within the next few days and receive medications approximately 1 week after that. Patient has been using OTC relion insulin in the meantime to control blood sugars .Confirms dose of 27 units BID.   Goals Addressed            This Visit's Progress   . I have not had any diabetes education (pt-stated)   On track    Current Barriers:  [name] Knowledge Deficits related to basic Diabetes pathophysiology and self care/management . Financial strain (lack of medication coverage)  Nurse Case Manager Clinical Goal(s):  [name] Over the next 30 days, patient will demonstrate improved adherence to prescribed treatment plan for diabetes self care/management as evidenced by checking blood glucose levels as prescribed, adhering to ADA/low carb diet, daily exercise, and medication adherence  Interventions:  . Assessed need for education reinforcement . Assessed for medication adherence . Discussed plans with patient for ongoing care management follow up and provided patient with direct contact information for care management team . Reviewed recent CBG readings . Provided reassurance and emotional support as patient is fearful he be hospitalized again for DKA if sugars are elevated. . Advised patient, providing education and rationale, to check cbg TID and record, calling Dr. Honor Junes for findings outside established parameters.     Patient Self Care Activities:  . Self administers medications as prescribed . Attends all scheduled provider appointments . Checks blood sugars as prescribed and utilize hyper and hypoglycemia protocol as needed . Adhere to ADA diet as discussed . Makes eye and dental appointments ASAP . Engage with [facility] Pharmacist for ongoing medication management and affordibility  Plan:  . RN CM will follow up with patient in 30 days to assess goal   Please see past updates related to this goal by clicking on the "Past Updates" button in the selected goal         Follow up: Telephone follow up appointment with care management team member scheduled for: 2 weeks with PharmD    Ruben Reason, PharmD Clinical Pharmacist [place] [phone]

## 2018-11-25 ENCOUNTER — Ambulatory Visit: Payer: Medicare Other | Admitting: Pharmacist

## 2018-11-25 DIAGNOSIS — E1111 Type 2 diabetes mellitus with ketoacidosis with coma: Secondary | ICD-10-CM

## 2018-11-25 DIAGNOSIS — E119 Type 2 diabetes mellitus without complications: Secondary | ICD-10-CM

## 2018-11-26 ENCOUNTER — Telehealth: Payer: Self-pay

## 2018-11-26 ENCOUNTER — Ambulatory Visit: Payer: Self-pay

## 2018-11-26 NOTE — Chronic Care Management (AMB) (Signed)
   Chronic Care Management   Unsuccessful Call Note 11/26/2018 Name: Joseph Luna MRN: [id number] DOB: 26-Jun-1950  [name] is a 68 y.o. year old male who is a primary care patient of Trinna Post, [place]. The CCM team was consulted for assistance with chronic disease management and care coordination needs.     Was unable to reach patient via telephone today for follow up DM. I have left HIPAA compliant voicemail asking patient to return my call. (unsuccessful outreach #1).   Plan: Will follow-up within 7 business days via telephone.    Jusitn Salsgiver E. Rollene Rotunda, RN, BSN Nurse Care Coordinator [facility] Care Management [phone]

## 2018-11-26 NOTE — Chronic Care Management (AMB) (Signed)
  Chronic Care Management   Follow Up Note   11/26/2018 Name: Joseph Luna MRN: [id number] DOB: April 28, 1950   Subjective Joseph Luna is a 68 y.o. year old male who is a primary care patient of Trinna Post, [place]. The CCM clinical pharmacist received incoming call today from patient regarding insulin from [facility]. CCM pharmacist spent >60 minutes in care coordination and returned call to patient.   Assessment: Corrected issue with [facility] in patient's profile (mail order pharmacy fulfilling [employer] prescriptions). This is the second time there has been an issue with [facility], taking >60 minutes to resolve. Patient expresses frustrations, seems to think it is pharmacist's error.  Patient should receive phone call scheduling shipment within the next few days and receive medications approximately 1 week after that. Patient has been using OTC relion insulin in the meantime to control blood sugars .Confirms dose of 27 units BID.   Follow up: Telephone follow up appointment with care management team member scheduled for: 2 weeks with PharmD    Ruben Reason, PharmD Clinical Pharmacist [place] [phone]

## 2018-12-02 ENCOUNTER — Other Ambulatory Visit: Payer: Self-pay

## 2018-12-02 ENCOUNTER — Encounter: Payer: Self-pay | Admitting: Physician Assistant

## 2018-12-02 ENCOUNTER — Ambulatory Visit (INDEPENDENT_AMBULATORY_CARE_PROVIDER_SITE_OTHER): Payer: Medicare Other | Admitting: Physician Assistant

## 2018-12-02 ENCOUNTER — Ambulatory Visit: Payer: Self-pay | Admitting: Pharmacist

## 2018-12-02 VITALS — Wt 184.2 lb

## 2018-12-02 DIAGNOSIS — E1111 Type 2 diabetes mellitus with ketoacidosis with coma: Secondary | ICD-10-CM

## 2018-12-02 DIAGNOSIS — R9430 Abnormal result of cardiovascular function study, unspecified: Secondary | ICD-10-CM | POA: Diagnosis not present

## 2018-12-02 DIAGNOSIS — Z23 Encounter for immunization: Secondary | ICD-10-CM | POA: Diagnosis not present

## 2018-12-02 DIAGNOSIS — G629 Polyneuropathy, unspecified: Secondary | ICD-10-CM

## 2018-12-02 DIAGNOSIS — Z1211 Encounter for screening for malignant neoplasm of colon: Secondary | ICD-10-CM

## 2018-12-02 DIAGNOSIS — E0590 Thyrotoxicosis, unspecified without thyrotoxic crisis or storm: Secondary | ICD-10-CM | POA: Diagnosis not present

## 2018-12-02 LAB — POCT GLYCOSYLATED HEMOGLOBIN (HGB A1C): Hemoglobin A1C: 5.7 % — AB (ref 4.0–5.6)

## 2018-12-02 MED ORDER — DULOXETINE HCL 60 MG PO CPEP: 60.0000 mg | ORAL_CAPSULE | Freq: Every day | ORAL | 0 refills | Status: DC

## 2018-12-02 NOTE — Chronic Care Management (AMB) (Signed)
  Chronic Care Management   Care Coordination Note  12/02/2018 Name: Joseph Luna MRN: [id number] DOB: 1950/12/21  In [employer] from [place], [place]. Patient still has not received insulin from [facility] that was supposedly expedited 11/25/18 (see notes). Contacted patient and provided [employer] telephone number [phone]) and directed to call at 8am due to extremely long hold times. I am unsure at this time what the delay is and there is nothing more I can do. Perhaps the company for the third time is continuing to call the incorrect phone number for the patient that they have input into their system incorrectly due to the inability to transcribe from the application properly, I have no idea.   Follow up plan: Telephone follow up appointment with care management team member scheduled for: Thurday  Ruben Reason, PharmD Clinical Pharmacist [place] [phone]

## 2018-12-02 NOTE — Progress Notes (Signed)
Patient: Joseph Luna Male    DOB: 1950/07/21   68 y.o.   MRN: [id number] Visit Date: 12/02/2018  Today's Provider: Trinna Post, PA-C   Chief Complaint  Patient presents with  . Diabetes   Subjective:     HPI   3 Month Diabetes Follow Up.   DM II: Has established with Dr. Honor Junes with endocrinology. 5 months ago during his hospitalization, his A1c was >15.5. On recheck 4 months ago it was 9.5%. Today it is 5.7%. He is using novolin 70/30 mix. He is currently on a statin. His urine microalbumin was normal. Eye exam 10/02/2018 no retinopathy but he does have a mild cataract. He is not up to date with pneumovax. He is due for pneumovax.   Peripheral Neuropathy: He continues to struggle peripheral neuropathy, left worse than right leg. His EMG showed mixed severe peripheral neuropathy in bilateral lower extremities. He was initially scheduled for [employer] but then cancelled this because he didn't want to commit financially for something that could provide only temporary relief. He was previously taking gabapentin 300 mg TID which he then increased to 600 mg BID and 300 mg QD. He has stopped this because he said while the first dose worked, it stopped working. He reports his pain has increased now.   HLD: Continues with lipitor 10 mg QHS.   Dilated Cardiomyopathy: Additionally he saw Dr. Rockey Situ for dilated cardiomyopathy on 10/29/2018. Offered repeat echo and myoview. He had an NSTEMI and there was thought to be underlying cardiac disease. Patient wanted to think about additional testing but then decided not to proceed. He does not have symptoms of chest pain at this time.   Drug Assistance: There has been some delays in drug assistance due to errors with the company. Patient queries this today.   Colon Cancer Screening: Reports he has received the cologuard test kit but has not yet completed.   Allergies  Allergen Reactions  . Heparin   . Penicillin G Itching     Current  Outpatient Medications:  .  aspirin EC 81 MG EC tablet, Take 1 tablet (81 mg total) by mouth daily., Disp: 30 tablet, Rfl: 0 .  atorvastatin (LIPITOR) 10 MG tablet, Take 1 tablet (10 mg total) by mouth daily., Disp: 90 tablet, Rfl: 1 .  DULoxetine (CYMBALTA) 60 MG capsule, Take 1 capsule (60 mg total) by mouth daily., Disp: 30 capsule, Rfl: 0 .  gabapentin (NEURONTIN) 300 MG capsule, Take 1 capsule (300 mg total) by mouth 3 (three) times daily., Disp: 270 capsule, Rfl: 0 .  Insulin Isophane & Regular Human (NOVOLIN 70/30 FLEXPEN RELION) (70-30) 100 UNIT/ML PEN, Inject 21 Units into the skin 2 (two) times daily with a meal., Disp: 15 mL, Rfl: 0 .  methimazole (TAPAZOLE) 10 MG tablet, Take 15 mg by mouth daily. , Disp: , Rfl:  .  metoprolol tartrate (LOPRESSOR) 25 MG tablet, Take 0.5 tablets (12.5 mg total) by mouth 2 (two) times daily., Disp: 180 tablet, Rfl: 3  Review of Systems  Social History   Tobacco Use  . Smoking status: Current Every Day Smoker  . Smokeless tobacco: Never Used  Substance Use Topics  . Alcohol use: Not Currently      Objective:   Wt 184 lb 3.2 oz (83.6 kg)   BMI 24.30 kg/m  Vitals:   12/02/18 1333  Weight: 184 lb 3.2 oz (83.6 kg)     Physical Exam Constitutional:  Appearance: Normal appearance.  Cardiovascular:     Rate and Rhythm: Normal rate and regular rhythm.     Heart sounds: Normal heart sounds.  Pulmonary:     Effort: Pulmonary effort is normal.     Breath sounds: Normal breath sounds.  Neurological:     Mental Status: He is alert and oriented to person, place, and time. Mental status is at baseline.  Psychiatric:        Mood and Affect: Mood normal.        Behavior: Behavior normal.      Results for orders placed or performed in visit on 12/02/18  POCT HgB A1C  Result Value Ref Range   Hemoglobin A1C 5.7 (A) 4.0 - 5.6 %   HbA1c POC (<> result, manual entry)     HbA1c, POC (prediabetic range)     HbA1c, POC (controlled diabetic  range)         Assessment & Plan    1. Type 2 diabetes mellitus with ketoacidotic coma, unspecified whether long term insulin use (HCC)  A1c is 5.7% and is much better controlled now. Continue follow up with Dr. Honor Junes. He does not have prescription drug coverage and is paying 25$ out of pocket for his insulin at [facility].   - POCT HgB A1C  2. Hyperthyroidism  Followed by Dr. Honor Junes.  3. Ejection fraction < 50%  History of NSTEMI. Recommended repeat echo with Myoview but patient has declined.   4. Polyneuropathy  Patient stopped taking gabapentin. Cancelled ESI due to uncertain benefit and tight finances. Counseled that gabapentin was not at maximum dose and may have helped pain if we had increase dose. Patient would like to try different method. Will start on Cymbalta. I have written for 60 mg, #30 which would be 15 dollars at [facility] but I would like him to start with 1/2 capsule or tablet. I wrote it this way for financial reasons.  Counseled patient I typically have people follow up 6 weeks after starting this. However, he is in a difficult financial situation and I will give him option to leave follow up more open ended and to schedule if he has issues. Otherwise, will see him back in 6 months.   - DULoxetine (CYMBALTA) 60 MG capsule; Take 1 capsule (60 mg total) by mouth daily.  Dispense: 30 capsule; Refill: 0  5. Need for vaccination against Streptococcus pneumoniae  Updated pneumovax today.  6. Colon cancer screening  Reminded to complete cologuard.  The entirety of the information documented in the History of Present Illness, Review of Systems and Physical Exam were personally obtained by me. Portions of this information were initially documented by Doran Clay, LPN and reviewed by me for thoroughness and accuracy.           Trinna Post, PA-C  [facility]

## 2018-12-02 NOTE — Patient Instructions (Addendum)
Take 1/2 tablet of cymbalta or 1/2 capsule daily to start.    Diabetes Mellitus and Exercise Exercising regularly is important for your overall health, especially when you have diabetes (diabetes mellitus). Exercising is not only about losing weight. It has many other health benefits, such as increasing muscle strength and bone density and reducing body fat and stress. This leads to improved fitness, flexibility, and endurance, all of which result in better overall health. Exercise has additional benefits for people with diabetes, including:  Reducing appetite.  Helping to lower and control blood glucose.  Lowering blood pressure.  Helping to control amounts of fatty substances (lipids) in the blood, such as cholesterol and triglycerides.  Helping the body to respond better to insulin (improving insulin sensitivity).  Reducing how much insulin the body needs.  Decreasing the risk for heart disease by: ? Lowering cholesterol and triglyceride levels. ? Increasing the levels of good cholesterol. ? Lowering blood glucose levels. What is my activity plan? Your health care provider or certified diabetes educator can help you make a plan for the type and frequency of exercise (activity plan) that works for you. Make sure that you:  Do at least 150 minutes of moderate-intensity or vigorous-intensity exercise each week. This could be brisk walking, biking, or water aerobics. ? Do stretching and strength exercises, such as yoga or weightlifting, at least 2 times a week. ? Spread out your activity over at least 3 days of the week.  Get some form of physical activity every day. ? Do not go more than 2 days in a row without some kind of physical activity. ? Avoid being inactive for more than 30 minutes at a time. Take frequent breaks to walk or stretch.  Choose a type of exercise or activity that you enjoy, and set realistic goals.  Start slowly, and gradually increase the intensity of your  exercise over time. What do I need to know about managing my diabetes?   Check your blood glucose before and after exercising. ? If your blood glucose is 240 mg/dL (40.913.3 mmol/L) or higher before you exercise, check your urine for ketones. If you have ketones in your urine, do not exercise until your blood glucose returns to normal. ? If your blood glucose is 100 mg/dL (5.6 mmol/L) or lower, eat a snack containing 15-20 grams of carbohydrate. Check your blood glucose 15 minutes after the snack to make sure that your level is above 100 mg/dL (5.6 mmol/L) before you start your exercise.  Know the symptoms of low blood glucose (hypoglycemia) and how to treat it. Your risk for hypoglycemia increases during and after exercise. Common symptoms of hypoglycemia can include: ? Hunger. ? Anxiety. ? Sweating and feeling clammy. ? Confusion. ? Dizziness or feeling light-headed. ? Increased heart rate or palpitations. ? Blurry vision. ? Tingling or numbness around the mouth, lips, or tongue. ? Tremors or shakes. ? Irritability.  Keep a rapid-acting carbohydrate snack available before, during, and after exercise to help prevent or treat hypoglycemia.  Avoid injecting insulin into areas of the body that are going to be exercised. For example, avoid injecting insulin into: ? The arms, when playing tennis. ? The legs, when jogging.  Keep records of your exercise habits. Doing this can help you and your health care provider adjust your diabetes management plan as needed. Write down: ? Food that you eat before and after you exercise. ? Blood glucose levels before and after you exercise. ? The type and amount of exercise  you have done. ? When your insulin is expected to peak, if you use insulin. Avoid exercising at times when your insulin is peaking.  When you start a new exercise or activity, work with your health care provider to make sure the activity is safe for you, and to adjust your insulin,  medicines, or food intake as needed.  Drink plenty of water while you exercise to prevent dehydration or heat stroke. Drink enough fluid to keep your urine clear or pale yellow. Summary  Exercising regularly is important for your overall health, especially when you have diabetes (diabetes mellitus).  Exercising has many health benefits, such as increasing muscle strength and bone density and reducing body fat and stress.  Your health care provider or certified diabetes educator can help you make a plan for the type and frequency of exercise (activity plan) that works for you.  When you start a new exercise or activity, work with your health care provider to make sure the activity is safe for you, and to adjust your insulin, medicines, or food intake as needed. This information is not intended to replace advice given to you by your health care provider. Make sure you discuss any questions you have with your health care provider. Document Released: 06/24/2003 Document Revised: 10/26/2016 Document Reviewed: 09/13/2015 Elsevier Patient Education  2020 [employer].

## 2018-12-03 ENCOUNTER — Ambulatory Visit (INDEPENDENT_AMBULATORY_CARE_PROVIDER_SITE_OTHER): Payer: Medicare Other

## 2018-12-03 DIAGNOSIS — E1111 Type 2 diabetes mellitus with ketoacidosis with coma: Secondary | ICD-10-CM | POA: Diagnosis not present

## 2018-12-03 NOTE — Chronic Care Management (AMB) (Signed)
Chronic Care Management   Follow Up Note   12/03/2018 Name: Joseph Luna MRN: [id number] DOB: January 15, 1951  Referred by: Trinna Post, PA-C Reason for referral : Chronic Care Management (follow up DM)   Subjective: "I am doing great"   Objective:  Lab Results  Component Value Date   HGBA1C 5.7 (A) 12/02/2018   HGBA1C 9.5 (A) 07/30/2018   HGBA1C >15.5 (H) 06/13/2018   Lab Results  Component Value Date   LDLCALC 92 07/30/2018   CREATININE 0.87 07/30/2018     Assessment: Joseph Luna is a 68 y.o. year old male who is a primary care patient of Trinna Post, PA-C. The CCM team was consulted for assistance with chronic disease management and care coordination needs.    Review of patient status, including review of consultants reports, relevant laboratory and other test results, and collaboration with appropriate care team members and the patient's provider was performed as part of comprehensive patient evaluation and provision of chronic care management services.    Goals Addressed            This Visit's Progress   . I don't have any medication insurance coverage" (pt-stated)       Joseph Luna is limited in the medications he can be prescribed secondary to cost. He has no medication coverage and is currently paying for all medications out of pocket. He is working with CCM clinic pharmacist on assistance with insulin from [facility]. He is open to purchasing a part D plan if affordable.   Current Barriers:  Joseph Luna Knowledge deficit related to where to find information on the cost of Medicare part D coverage  Nurse Case Manager Clinical Goal(s):  Joseph Luna Over the next 90 days, patient will make appointment with [facility] office to discuss options for Medicare part D coverage enrollment  Interventions:  . Provided patient with contact information for [facility] office: o [address], [place] [address] o [phone]  Patient Self  Care Activities:  . Self administers medications as prescribed . Attends all scheduled provider appointments . Calls pharmacy for medication refills . Attends church or other social activities . Performs ADL's independently . Performs IADL's independently . Calls provider office for new concerns or questions  Initial goal documentation     . I have not had any diabetes education (pt-stated)   On track    Joseph Luna is doing well managing his diabetes through medication compliance, daily activity/exercise although limited by neuropathy and foot drop, and diet modifications. His recent A1C is down to 5.7   Current Barriers:  Joseph Luna Knowledge Deficits related to basic Diabetes pathophysiology and self care/management . Financial strain (lack of medication coverage)  Nurse Case Manager Clinical Goal(s):  Joseph Luna Over the next 30 days, patient will demonstrate improved adherence to prescribed treatment plan for diabetes self care/management as evidenced by checking blood glucose levels as prescribed, adhering to ADA/low carb diet, daily exercise, and medication adherence-goal met 12/03/2018 . Over the next 90 days, patient will demonstrate continued adherence to prescribed treatment plan for diabetes self care management as evidenced by checkinb blood glucose levels as prescribed, adhering to ADA/low carb diet, daily exercise, and medication adherence  Interventions:  . Assessed need for education reinforcement . Assessed for medication adherence . Discussed plans with patient for ongoing care management follow up and provided patient with direct contact information for care management team . Reviewed recent CBG readings . Advised patient,  providing education and rationale, to check cbg TID and record, calling Dr. Honor Junes for findings outside established parameters.    Patient Self Care Activities:  . Self administers medications as prescribed . Attends all scheduled provider appointments . Checks  blood sugars as prescribed and utilize hyper and hypoglycemia protocol as needed . Adhere to ADA diet as discussed . Makes eye and dental appointments ASAP . Engage with [facility] Pharmacist for ongoing medication management and affordibility   Please see past updates related to this goal by clicking on the "Past Updates" button in the selected goal      . COMPLETED: My left leg will give out on me if I am not careful (pt-stated)       Current Barriers:  Joseph Luna Knowledge Deficits related to fall precautions . Nerve damage left leg .  Nurse Case Manager Clinical Goal(s):  Joseph Luna Over the next 14 days, patient will demonstrate improved adherence to prescribed treatment plan for decreasing falls as evidenced by patient reporting and review of EMR . Over the next 14 days, patient will verbalize using fall risk reduction strategies discussed . Over the next 30 days, patient will not experience falls .   Interventions:  . Discussed todays PCP visit regarding neuropathy left leg . Assessed for understanding of newly prescribed medications including side effects (gabapentin) . Assessed for recent falls . Assessed patients knowledge of fall risk prevention    Patient Self Care Activities:  . Utilize Rolator or Cane appropriately with all ambulation . De-clutter walkways . Change positions slowly . Wear secure fitting shoes at all times with ambulation . Utilize home lighting for dim lit areas  Plan: . CCM RN CM will follow up with patient next week for assessment of effectiveness of gabapentin   Please see past updates in goals section for documentation of goal progression          The care management team will reach out to the patient again over the next 90 days.  The patient has been provided with contact information for the care management team and has been advised to call with any health related questions or concerns.    Joseph Allum E. Rollene Rotunda, RN, BSN Nurse Care Coordinator [facility] Care Management [phone]

## 2018-12-03 NOTE — Patient Instructions (Signed)
Thank you allowing the Chronic Care Management Team to be a part of your care! It was a pleasure speaking with you today!  1. Continue to take all medications as prescribed 2. Please utilize the [facility] office for information on medicare part D plans 3. Continue to check your blood sugars as Dr. Honor Junes requested 4. Continue to exercise daily as tolerated and adhere to a diabetic/carb counting diet 5.   CCM (Chronic Care Management) Team   Trish Fountain RN, BSN Nurse Care Coordinator  [phone]  Ruben Reason PharmD  Clinical Pharmacist  [phone]   Elliot Gurney, LCSW Clinical Social Worker [phone]  Goals Addressed            This Visit's Progress   . I don't have any medication insurance coverage" (pt-stated)       Current Barriers:  [name] Knowledge deficit related to where to find information on the cost of Medicare part D coverage  Nurse Case Manager Clinical Goal(s):  [name] Over the next 90 days, patient will make appointment with [facility] office to discuss options for Medicare part D coverage enrollment  Interventions:  . Provided patient with contact information for [facility] office: o [address], [place] [address] o [phone]  Patient Self Care Activities:  . Self administers medications as prescribed . Attends all scheduled provider appointments . Calls pharmacy for medication refills . Attends church or other social activities . Performs ADL's independently . Performs IADL's independently . Calls provider office for new concerns or questions  Initial goal documentation     . I have not had any diabetes education (pt-stated)   On track    Current Barriers:  [name] Knowledge Deficits related to basic Diabetes pathophysiology and self care/management . Financial strain (lack of medication coverage)  Nurse Case Manager Clinical Goal(s):  [name] Over the next 30 days, patient will demonstrate improved  adherence to prescribed treatment plan for diabetes self care/management as evidenced by checking blood glucose levels as prescribed, adhering to ADA/low carb diet, daily exercise, and medication adherence-goal met 12/03/2018 . Over the next 90 days, patient will demonstrate continued adherence to prescribed treatment plan for diabetes self care management as evidenced by checkinb blood glucose levels as prescribed, adhering to ADA/low carb diet, daily exercise, and medication adherence  Interventions:  . Assessed need for education reinforcement . Assessed for medication adherence . Discussed plans with patient for ongoing care management follow up and provided patient with direct contact information for care management team . Reviewed recent CBG readings . Advised patient, providing education and rationale, to check cbg TID and record, calling Dr. Honor Junes for findings outside established parameters.    Patient Self Care Activities:  . Self administers medications as prescribed . Attends all scheduled provider appointments . Checks blood sugars as prescribed and utilize hyper and hypoglycemia protocol as needed . Adhere to ADA diet as discussed . Makes eye and dental appointments ASAP . Engage with [facility] Pharmacist for ongoing medication management and affordibility   Please see past updates related to this goal by clicking on the "Past Updates" button in the selected goal      . COMPLETED: My left leg will give out on me if I am not careful (pt-stated)       Current Barriers:  [name] Knowledge Deficits related to fall precautions . Nerve damage left leg .  Nurse Case Manager Clinical Goal(s):  [name] Over the next  14 days, patient will demonstrate improved adherence to prescribed treatment plan for decreasing falls as evidenced by patient reporting and review of EMR . Over the next 14 days, patient will verbalize using fall risk reduction strategies discussed . Over the next 30 days,  patient will not experience falls .   Interventions:  . Discussed todays PCP visit regarding neuropathy left leg . Assessed for understanding of newly prescribed medications including side effects (gabapentin) . Assessed for recent falls . Assessed patients knowledge of fall risk prevention    Patient Self Care Activities:  . Utilize Rolator or Cane appropriately with all ambulation . De-clutter walkways . Change positions slowly . Wear secure fitting shoes at all times with ambulation . Utilize home lighting for dim lit areas  Plan: . CCM RN CM will follow up with patient next week for assessment of effectiveness of gabapentin   Please see past updates in goals section for documentation of goal progression         The patient verbalized understanding of instructions provided today and declined a print copy of patient instruction materials.   The care management team will reach out to the patient again over the next 90 days.  The patient has been provided with contact information for the care management team and has been advised to call with any health related questions or concerns.   SYMPTOMS OF A STROKE   You have any symptoms of stroke. "BE FAST" is an easy way to remember the main warning signs: ? B - Balance. Signs are dizziness, sudden trouble walking, or loss of balance. ? E - Eyes. Signs are trouble seeing or a sudden change in how you see. ? F - Face. Signs are sudden weakness or loss of feeling of the face, or the face or eyelid drooping on one side. ? A - Arms. Signs are weakness or loss of feeling in an arm. This happens suddenly and usually on one side of the body. ? S - Speech. Signs are sudden trouble speaking, slurred speech, or trouble understanding what people say. ? T - Time. Time to call emergency services. Write down what time symptoms started.  You have other signs of stroke, such as: ? A sudden, very bad headache with no known cause. ? Feeling sick  to your stomach (nausea). ? Throwing up (vomiting). ? Jerky movements you cannot control (seizure).  SYMPTOMS OF A HEART ATTACK  What are the signs or symptoms? Symptoms of this condition include:  Chest pain. It may feel like: ? Crushing or squeezing. ? Tightness, pressure, fullness, or heaviness.  Pain in the arm, neck, jaw, back, or upper body.  Shortness of breath.  Heartburn.  Indigestion.  Nausea.  Cold sweats.  Feeling tired.  Sudden lightheadedness.

## 2018-12-12 ENCOUNTER — Ambulatory Visit: Payer: Medicare Other | Admitting: Pharmacist

## 2018-12-12 DIAGNOSIS — G629 Polyneuropathy, unspecified: Secondary | ICD-10-CM

## 2018-12-12 DIAGNOSIS — E1111 Type 2 diabetes mellitus with ketoacidosis with coma: Secondary | ICD-10-CM

## 2018-12-12 NOTE — Chronic Care Management (AMB) (Signed)
  Chronic Care Management   Follow Up Note   12/12/2018 Name: Joseph Luna MRN: [id number] DOB: 08/23/1950   Subjective Joseph Luna is a 68 y.o. year old male who is a primary care patient of Trinna Post, [place]. The CCM clinical pharmacist has been engaged with patient to assist patient with medication management and enrollment in [facility] program for Humulin 70/30. Successful telephone outreach, HIPAA identifiers verified.  Assessment: Patient finally received shipment of Humulin 70/30. However did not receive pen needles. Explained refill process to patient. I am still unsure of what the issue was with [name].   Goals Addressed            This Visit's Progress   . COMPLETED: I want to know more about my medicines (pt-stated)       Current Barriers:  [name] Knowledge deficit related to purpose and mechanism of medications taken for diabetes  Pharmacist Clinical Goal(s): Over the 30 days, Joseph Luna will verbalize understanding of medication plan as it relates to his plan of care.   Interventions: . Patient educated on purpose, proper use and potential adverse effects of insulin, ASA 81mg , metoprolol.   . Pharmacist researched reasons patient was previously taking metoprolol, currently taking ASA  Patient provided with an easy to understand medication guide made by CCM pharmacist  Pharmacist counseled patient on signs of hypoglycemia and what to do when BG is below 70 mg/dL   CCM pharmacist provided patient with [facility] application for Humulin 70/30, will fax provider portion to Dr. Honor Junes    CCM pharmacist submitted application to [facility]; approval and refills process will go through [place]  Updated 8/27: patient has received insulin finally through [place] [facility]   Patient Self Care Activities:  . Take all medications as prescribed . Contact provider for samples if prescription of Humulin 70/30 will run out before  enrollment in assistance program  Please see past updates related to this goal by clicking on the "Past Updates" button in the selected goal         Follow up: The patient has been provided with contact information for the care management team and has been advised to call with any health related questions or concerns.    Ruben Reason, PharmD Clinical Pharmacist [place] [phone]

## 2018-12-18 DIAGNOSIS — Z794 Long term (current) use of insulin: Secondary | ICD-10-CM | POA: Diagnosis not present

## 2018-12-18 DIAGNOSIS — E1165 Type 2 diabetes mellitus with hyperglycemia: Secondary | ICD-10-CM | POA: Diagnosis not present

## 2018-12-18 DIAGNOSIS — E0590 Thyrotoxicosis, unspecified without thyrotoxic crisis or storm: Secondary | ICD-10-CM | POA: Diagnosis not present

## 2018-12-25 DIAGNOSIS — Z794 Long term (current) use of insulin: Secondary | ICD-10-CM | POA: Diagnosis not present

## 2018-12-25 DIAGNOSIS — E1165 Type 2 diabetes mellitus with hyperglycemia: Secondary | ICD-10-CM | POA: Diagnosis not present

## 2018-12-25 DIAGNOSIS — E0590 Thyrotoxicosis, unspecified without thyrotoxic crisis or storm: Secondary | ICD-10-CM | POA: Diagnosis not present

## 2019-02-03 ENCOUNTER — Telehealth: Payer: Self-pay | Admitting: Physician Assistant

## 2019-02-03 DIAGNOSIS — G629 Polyneuropathy, unspecified: Secondary | ICD-10-CM

## 2019-02-03 NOTE — Telephone Encounter (Signed)
Patient is requesting a refill on the following medication  gabapentin (NEURONTIN) 300 MG capsule   He would like it sent to [place]

## 2019-02-04 MED ORDER — GABAPENTIN 300 MG PO CAPS: 300.0000 mg | ORAL_CAPSULE | Freq: Three times a day (TID) | ORAL | 1 refills | Status: DC

## 2019-03-30 ENCOUNTER — Other Ambulatory Visit: Payer: Self-pay | Admitting: Physician Assistant

## 2019-03-30 DIAGNOSIS — G629 Polyneuropathy, unspecified: Secondary | ICD-10-CM

## 2019-04-28 ENCOUNTER — Other Ambulatory Visit: Payer: Self-pay | Admitting: Physician Assistant

## 2019-04-28 DIAGNOSIS — G629 Polyneuropathy, unspecified: Secondary | ICD-10-CM

## 2019-05-29 ENCOUNTER — Other Ambulatory Visit: Payer: Self-pay | Admitting: Physician Assistant

## 2019-05-29 DIAGNOSIS — G629 Polyneuropathy, unspecified: Secondary | ICD-10-CM

## 2019-05-29 NOTE — Telephone Encounter (Signed)
Requested Prescriptions  Pending Prescriptions Disp Refills  . gabapentin (NEURONTIN) 300 MG capsule [Pharmacy Med Name: Gabapentin 300 MG Oral Capsule] 90 capsule 0    Sig: TAKE 1 CAPSULE BY MOUTH THREE TIMES DAILY     Neurology: Anticonvulsants - gabapentin Passed - 05/29/2019 10:00 AM      Passed - Valid encounter within last 12 months    Recent Outpatient Visits          5 months ago Type 2 diabetes mellitus with ketoacidotic coma, unspecified whether long term insulin use [facility])   [facility] [place], Ricki Rodriguez M, [place]   9 months ago Left leg weakness   [facility] Osvaldo Angst M, PA-C   10 months ago Type 2 diabetes mellitus with ketoacidotic coma, unspecified whether long term insulin use [facility])   [facility], Lavella [place], [place]      Future Appointments            In 6 days Trey Sailors, PA-C [employer], [facility]

## 2019-06-04 ENCOUNTER — Encounter: Payer: Self-pay | Admitting: Physician Assistant

## 2019-06-04 ENCOUNTER — Ambulatory Visit (INDEPENDENT_AMBULATORY_CARE_PROVIDER_SITE_OTHER): Payer: Medicare Other | Admitting: Physician Assistant

## 2019-06-04 ENCOUNTER — Other Ambulatory Visit: Payer: Self-pay

## 2019-06-04 VITALS — BP 132/74 | HR 90 | Temp 96.8°F | Wt 207.4 lb

## 2019-06-04 DIAGNOSIS — G629 Polyneuropathy, unspecified: Secondary | ICD-10-CM

## 2019-06-04 DIAGNOSIS — E1169 Type 2 diabetes mellitus with other specified complication: Secondary | ICD-10-CM | POA: Diagnosis not present

## 2019-06-04 DIAGNOSIS — I420 Dilated cardiomyopathy: Secondary | ICD-10-CM | POA: Diagnosis not present

## 2019-06-04 DIAGNOSIS — E1111 Type 2 diabetes mellitus with ketoacidosis with coma: Secondary | ICD-10-CM | POA: Diagnosis not present

## 2019-06-04 DIAGNOSIS — E785 Hyperlipidemia, unspecified: Secondary | ICD-10-CM | POA: Diagnosis not present

## 2019-06-04 MED ORDER — GABAPENTIN 300 MG PO CAPS: 300.0000 mg | ORAL_CAPSULE | Freq: Three times a day (TID) | ORAL | 1 refills | Status: DC

## 2019-06-04 MED ORDER — ATORVASTATIN CALCIUM 10 MG PO TABS: 10.0000 mg | ORAL_TABLET | Freq: Every day | ORAL | 1 refills | Status: DC

## 2019-06-04 NOTE — Progress Notes (Signed)
Patient: Joseph Luna Male    DOB: Sep 13, 1950   69 y.o.   MRN: [id number] Visit Date: 06/04/2019  Today's Provider: Trey Sailors, PA-C   Chief Complaint  Patient presents with  . Diabetes Mellitus   Subjective:    HPI  Diabetes Mellitus Type II, Follow-up:   Lab Results  Component Value Date   HGBA1C 5.7 (A) 12/02/2018   HGBA1C 9.5 (A) 07/30/2018   HGBA1C >15.5 (H) 06/13/2018    Last seen for diabetes 6 months ago. Last A1c 6.1% in 03/2019. Management since then includes insulin mix with [facility] endocrinology. He reports good compliance with treatment. He is having side effects.  Current symptoms include polydipsia and polyuria and have been unchanged. Home blood sugar records: 158  Episodes of hypoglycemia? no   Current insulin regiment: 21 units novolin twice daily Most Recent Eye Exam: UTD Weight trend: increasing steadily Prior visit with dietician: No Current exercise: walking Current diet habits: in general, a "healthy" diet     Pertinent Labs:    Component Value Date/Time   CHOL 183 07/30/2018 1530   TRIG 270 (H) 07/30/2018 1530   HDL 37 (L) 07/30/2018 1530   LDLCALC 92 07/30/2018 1530   CREATININE 0.87 07/30/2018 1530   CREATININE 0.97 05/04/2012 1008    Wt Readings from Last 3 Encounters:  06/04/19 207 lb 6.4 oz (94.1 kg)  12/02/18 184 lb 3.2 oz (83.6 kg)  10/29/18 185 lb (83.9 kg)   HLD: Taking lipitor 10 mg QHS. Doing well.   Lipid Panel     Component Value Date/Time   CHOL 183 07/30/2018 1530   TRIG 270 (H) 07/30/2018 1530   HDL 37 (L) 07/30/2018 1530   CHOLHDL 4.9 07/30/2018 1530   LDLCALC 92 07/30/2018 1530   LABVLDL 54 (H) 07/30/2018 1530   Continues to take gabapentin for neuropathy.   ------------------------------------------------------------------------  Patient would like sodium levels checked because they were elevated when he was in the hospital.   Allergies  Allergen Reactions  . Heparin   . Penicillin G  Itching     Current Outpatient Medications:  .  aspirin EC 81 MG EC tablet, Take 1 tablet (81 mg total) by mouth daily., Disp: 30 tablet, Rfl: 0 .  atorvastatin (LIPITOR) 10 MG tablet, Take 1 tablet (10 mg total) by mouth daily., Disp: 90 tablet, Rfl: 1 .  gabapentin (NEURONTIN) 300 MG capsule, TAKE 1 CAPSULE BY MOUTH THREE TIMES DAILY, Disp: 90 capsule, Rfl: 0 .  Insulin Isophane & Regular Human (NOVOLIN 70/30 FLEXPEN RELION) (70-30) 100 UNIT/ML PEN, Inject 21 Units into the skin 2 (two) times daily with a meal., Disp: 15 mL, Rfl: 0 .  methimazole (TAPAZOLE) 10 MG tablet, Take 15 mg by mouth daily. , Disp: , Rfl:  .  metoprolol tartrate (LOPRESSOR) 25 MG tablet, Take 0.5 tablets (12.5 mg total) by mouth 2 (two) times daily., Disp: 180 tablet, Rfl: 3 .  DULoxetine (CYMBALTA) 60 MG capsule, Take 1 capsule (60 mg total) by mouth daily., Disp: 30 capsule, Rfl: 0  Review of Systems  Social History   Tobacco Use  . Smoking status: Current Every Day Smoker  . Smokeless tobacco: Never Used  Substance Use Topics  . Alcohol use: Not Currently      Objective:   BP 132/74 (BP Location: Left Arm, Patient Position: Sitting, Cuff Size: Normal)   Pulse 90   Temp (!) 96.8 F (36 C) (Temporal)   Wt 207  lb 6.4 oz (94.1 kg)   BMI 27.36 kg/m  Vitals:   06/04/19 1407  BP: 132/74  Pulse: 90  Temp: (!) 96.8 F (36 C)  TempSrc: Temporal  Weight: 207 lb 6.4 oz (94.1 kg)  Body mass index is 27.36 kg/m.   Physical Exam Constitutional:      Appearance: Normal appearance.  Cardiovascular:     Rate and Rhythm: Normal rate and regular rhythm.     Heart sounds: Normal heart sounds.  Pulmonary:     Effort: Pulmonary effort is normal.     Breath sounds: Normal breath sounds.  Skin:    General: Skin is warm and dry.  Neurological:     Mental Status: He is alert and oriented to person, place, and time. Mental status is at baseline.  Psychiatric:        Mood and Affect: Mood normal.         Behavior: Behavior normal.      No results found for any visits on 06/04/19.     Assessment & Plan  1. Type 2 diabetes mellitus with ketoacidotic coma, unspecified whether long term insulin use ([place])  Controlled, continue with insulin and [facility].   - Comprehensive Metabolic Panel (CMET) - Lipid Profile - Urine Microalbumin w/creat. ratio - atorvastatin (LIPITOR) 10 MG tablet; Take 1 tablet (10 mg total) by mouth daily.  Dispense: 90 tablet; Refill: 1  2. Hyperlipidemia associated with type 2 diabetes mellitus (HCC)  Continue lipitor.  3. Dilated cardiomyopathy ([place])  Reassessed by cardiology with PRN followup.  4. Neuropathy  Continue.  - gabapentin (NEURONTIN) 300 MG capsule; Take 1 capsule (300 mg total) by mouth 3 (three) times daily.  Dispense: 90 capsule; Refill: 1  The entirety of the information documented in the History of Present Illness, Review of Systems and Physical Exam were personally obtained by me. Portions of this information were initially documented by [facility] and reviewed by me for thoroughness and accuracy.      Trinna Post, PA-C  [facility]

## 2019-06-05 ENCOUNTER — Telehealth: Payer: Self-pay

## 2019-06-05 LAB — COMPREHENSIVE METABOLIC PANEL
ALT: 19 IU/L (ref 0–44)
AST: 17 IU/L (ref 0–40)
Albumin/Globulin Ratio: 1.9 (ref 1.2–2.2)
Albumin: 4.5 g/dL (ref 3.8–4.8)
Alkaline Phosphatase: 52 IU/L (ref 39–117)
BUN/Creatinine Ratio: 15 (ref 10–24)
BUN: 17 mg/dL (ref 8–27)
Bilirubin Total: 0.3 mg/dL (ref 0.0–1.2)
CO2: 19 mmol/L — ABNORMAL LOW (ref 20–29)
Calcium: 9.4 mg/dL (ref 8.6–10.2)
Chloride: 107 mmol/L — ABNORMAL HIGH (ref 96–106)
Creatinine, Ser: 1.17 mg/dL (ref 0.76–1.27)
GFR calc Af Amer: 74 mL/min/{1.73_m2} (ref >59)
GFR calc non Af Amer: 64 mL/min/{1.73_m2} (ref >59)
Globulin, Total: 2.4 g/dL (ref 1.5–4.5)
Glucose: 113 mg/dL — ABNORMAL HIGH (ref 65–99)
Potassium: 4.4 mmol/L (ref 3.5–5.2)
Sodium: 142 mmol/L (ref 134–144)
Total Protein: 6.9 g/dL (ref 6.0–8.5)

## 2019-06-05 LAB — LIPID PANEL
Chol/HDL Ratio: 3.1 ratio (ref 0.0–5.0)
Cholesterol, Total: 94 mg/dL — ABNORMAL LOW (ref 100–199)
HDL: 30 mg/dL — ABNORMAL LOW (ref >39)
LDL Chol Calc (NIH): 44 mg/dL (ref 0–99)
Triglycerides: 109 mg/dL (ref 0–149)
VLDL Cholesterol Cal: 20 mg/dL (ref 5–40)

## 2019-06-05 LAB — MICROALBUMIN / CREATININE URINE RATIO
Creatinine, Urine: 139.4 mg/dL
Microalb/Creat Ratio: 5 mg/g creat (ref 0–29)
Microalbumin, Urine: 7 ug/mL

## 2019-06-05 NOTE — Telephone Encounter (Signed)
Patient advised as below.  

## 2019-06-05 NOTE — Telephone Encounter (Signed)
-----   Message from Trey Sailors, [place] sent at 06/05/2019  1:46 PM EST ----- Please let patient know labs look stable and his sodium is normal.   Best, Osval[place] Angst, PA-C

## 2019-07-01 ENCOUNTER — Other Ambulatory Visit: Payer: Self-pay | Admitting: Physician Assistant

## 2019-07-01 DIAGNOSIS — G629 Polyneuropathy, unspecified: Secondary | ICD-10-CM

## 2019-08-06 DIAGNOSIS — E1165 Type 2 diabetes mellitus with hyperglycemia: Secondary | ICD-10-CM | POA: Diagnosis not present

## 2019-08-06 DIAGNOSIS — E0590 Thyrotoxicosis, unspecified without thyrotoxic crisis or storm: Secondary | ICD-10-CM | POA: Diagnosis not present

## 2019-08-06 DIAGNOSIS — Z794 Long term (current) use of insulin: Secondary | ICD-10-CM | POA: Diagnosis not present

## 2019-08-12 ENCOUNTER — Other Ambulatory Visit: Payer: Self-pay | Admitting: Physician Assistant

## 2019-08-12 DIAGNOSIS — E1111 Type 2 diabetes mellitus with ketoacidosis with coma: Secondary | ICD-10-CM

## 2019-08-22 ENCOUNTER — Other Ambulatory Visit: Payer: Self-pay | Admitting: Physician Assistant

## 2019-08-22 DIAGNOSIS — E1111 Type 2 diabetes mellitus with ketoacidosis with coma: Secondary | ICD-10-CM

## 2019-08-22 NOTE — Telephone Encounter (Signed)
[facility] faxed refill request for the following medications:  gabapentin (NEURONTIN) 300 MG capsule  atorvastatin (LIPITOR) 10 MG tablet   Please advise.

## 2019-08-25 NOTE — Telephone Encounter (Signed)
Medication refilled was send into pharmacy already.

## 2019-08-27 ENCOUNTER — Other Ambulatory Visit: Payer: Self-pay | Admitting: Physician Assistant

## 2019-08-27 DIAGNOSIS — G629 Polyneuropathy, unspecified: Secondary | ICD-10-CM

## 2019-08-27 DIAGNOSIS — E1111 Type 2 diabetes mellitus with ketoacidosis with coma: Secondary | ICD-10-CM

## 2019-08-27 MED ORDER — GABAPENTIN 300 MG PO CAPS: 300.0000 mg | ORAL_CAPSULE | Freq: Three times a day (TID) | ORAL | 2 refills | Status: DC

## 2019-08-27 MED ORDER — ATORVASTATIN CALCIUM 10 MG PO TABS: 10.0000 mg | ORAL_TABLET | Freq: Every day | ORAL | 2 refills | Status: DC

## 2019-08-27 NOTE — Telephone Encounter (Signed)
[facility] faxed refill request for the following medications:  gabapentin (NEURONTIN) capsule atorvastatin (LIPITOR) Tablet  Please advise.  Thanks, [employer]

## 2019-11-03 ENCOUNTER — Other Ambulatory Visit: Payer: Self-pay | Admitting: Physician Assistant

## 2019-11-03 DIAGNOSIS — G629 Polyneuropathy, unspecified: Secondary | ICD-10-CM

## 2019-11-03 NOTE — Telephone Encounter (Signed)
Requested Prescriptions  Pending Prescriptions Disp Refills  . gabapentin (NEURONTIN) 300 MG capsule [Pharmacy Med Name: GABAPENTIN 300MG     CAP] 90 capsule 2    Sig: TAKE 1 CAPSULE BY MOUTH THREE TIMES DAILY     Neurology: Anticonvulsants - gabapentin Passed - 11/03/2019 11:17 AM      Passed - Valid encounter within last 12 months    Recent Outpatient Visits          5 months ago Type 2 diabetes mellitus with ketoacidotic coma, unspecified whether long term insulin use [facility])   [employer] [facility] [place], [name] M, PA-C   11 months ago Type 2 diabetes mellitus with ketoacidotic coma, unspecified whether long term insulin use [facility])   [facility] [place], [place], [place]   1 year ago Left leg weakness   [facility] [place], [place] M, M   1 year ago Type 2 diabetes mellitus with ketoacidotic coma, unspecified whether long term insulin use [facility])   [facility] [place], [place], Lavella Hammock      Future Appointments            In 4 weeks [place], PA-C Trey Sailors, [facility]

## 2019-11-04 NOTE — Progress Notes (Signed)
Subjective:   Joseph Luna is a 69 y.o. male who presents for an Initial Medicare Annual Wellness Visit.  I connected with Madelin Headings today by telephone and verified that I am speaking with the correct person using two identifiers. Location patient: home Location provider: work Persons participating in the virtual visit: patient, provider.   I discussed the limitations, risks, security and privacy concerns of performing an evaluation and management service by telephone and the availability of in person appointments. I also discussed with the patient that there may be a patient responsible charge related to this service. The patient expressed understanding and verbally consented to this telephonic visit.    Interactive audio and video telecommunications were attempted between this provider and patient, however failed, due to patient having technical difficulties OR patient did not have access to video capability.  We continued and completed visit with audio only.  Review of Systems    N/A Cardiac Risk Factors include: advanced age (>66mn, >>5women);diabetes mellitus;male gender;dyslipidemia;smoking/ tobacco exposure     Objective:    Today's Vitals   11/05/19 1352  PainSc: 4    There is no height or weight on file to calculate BMI.  Advanced Directives 11/05/2019 08/16/2018  Does Patient Have a Medical Advance Directive? No No  Would patient like information on creating a medical advance directive? No - Patient declined Yes (MAU/Ambulatory/Procedural Areas - Information given)    Current Medications (verified) Outpatient Encounter Medications as of 11/05/2019  Medication Sig  . acetaminophen (TYLENOL) 500 MG tablet Take 500 mg by mouth every 6 (six) hours as needed.  .[name] Kitchenaspirin EC 81 MG EC tablet Take 1 tablet (81 mg total) by mouth daily.  .[name] Kitchenatorvastatin (LIPITOR) 10 MG tablet Take 1 tablet (10 mg total) by mouth daily.  .[name] Kitchengabapentin (NEURONTIN) 300 MG capsule TAKE 1 CAPSULE BY  MOUTH THREE TIMES DAILY  . Insulin Isophane & Regular Human (NOVOLIN 70/30 FLEXPEN RELION) (70-30) 100 UNIT/ML PEN Inject 21 Units into the skin 2 (two) times daily with a meal.  . methimazole (TAPAZOLE) 10 MG tablet Take 10 mg by mouth daily. Two tablets daily on M,W,F and 1 tablets all other days.  . metoprolol tartrate (LOPRESSOR) 25 MG tablet Take 0.5 tablets (12.5 mg total) by mouth 2 (two) times daily.   No facility-administered encounter medications on file as of 11/05/2019.    Allergies (verified) Heparin and Penicillin g   History: Past Medical History:  Diagnosis Date  . Arthritis   . Diabetes mellitus without complication ([place]   . Hyperlipidemia   . Thyroid disease    Past Surgical History:  Procedure Laterality Date  . APPENDECTOMY    . none     Family History  Problem Relation Age of Onset  . Cancer Mother   . Heart failure Father   . Cancer Sister    Social History   Socioeconomic History  . Marital status: Divorced    Spouse name: Not on file  . Number of children: 2  . Years of education: Not on file  . Highest education level: Associate degree: occupational, tHotel manager or vocational program  Occupational History  . Occupation: retired  Tobacco Use  . Smoking status: Current Every Day Smoker    Packs/day: 0.50    Years: 40.00    Pack years: 20.00    Types: Cigarettes  . Smokeless tobacco: Never Used  Vaping Use  . Vaping Use: Never used  Substance and Sexual Activity  . Alcohol  use: Yes    Comment: 1-2 beers occaisonally  . Drug use: Never  . Sexual activity: Not Currently  Other Topics Concern  . Not on file  Social History Narrative  . Not on file   Social Determinants of Health   Financial Resource Strain: Low Risk   . Difficulty of Paying Living Expenses: Not hard at all  Food Insecurity: No Food Insecurity  . Worried About Charity fundraiser in the Last Year: Never true  . Ran Out of Food in the Last Year: Never true    Transportation Needs: No Transportation Needs  . Lack of Transportation (Medical): No  . Lack of Transportation (Non-Medical): No  Physical Activity: Inactive  . Days of Exercise per Week: 0 days  . Minutes of Exercise per Session: 0 min  Stress: No Stress Concern Present  . Feeling of Stress : Not at all  Social Connections: Socially Isolated  . Frequency of Communication with Friends and Family: More than three times a week  . Frequency of Social Gatherings with Friends and Family: Once a week  . Attends Religious Services: Never  . Active Member of Clubs or Organizations: No  . Attends Archivist Meetings: Never  . Marital Status: Divorced    Tobacco Counseling Ready to quit: No Counseling given: No   Clinical Intake:  Pre-visit preparation completed: Yes  Pain : 0-10 Pain Score: 4  Pain Type: Neuropathic pain Pain Location: Leg (and left foot) Pain Orientation: Left Pain Descriptors / Indicators: Aching, Spasm Pain Frequency: Constant Pain Relieving Factors: Does exercises and takes Gabapentin for the pain.  Pain Relieving Factors: Does exercises and takes Gabapentin for the pain.  Nutritional Risks: None Diabetes: Yes  How often do you need to have someone help you when you read instructions, pamphlets, or other written materials from your doctor or pharmacy?: 1 - Never  Diabetic? Yes  Nutrition Risk Assessment:  Has the patient had any N/V/D within the last 2 months?  No  Does the patient have any non-healing wounds?  No  Has the patient had any unintentional weight loss or weight gain?  No   Diabetes:  Is the patient diabetic?  Yes  If diabetic, was a CBG obtained today?  No  Did the patient bring in their glucometer from home?  No  How often do you monitor your CBG's? Three to four times a day.   Financial Strains and Diabetes Management:  Are you having any financial strains with the device, your supplies or your medication? No .  Does  the patient want to be seen by Chronic Care Management for management of their diabetes?  No  Would the patient like to be referred to a Nutritionist or for Diabetic Management?  No   Diabetic Exams:  Diabetic Eye Exam: Overdue for diabetic eye exam. Pt has been advised about the importance in completing this exam. Patient advised to call and schedule an eye exam. Diabetic Foot Exam: Overdue, Pt has been advised about the importance in completing this exam. Note made to follow up on this at next in office apt.   Interpreter Needed?: No  Information entered by :: [facility], LPN   Activities of Daily Living In your present state of health, do you have any difficulty performing the following activities: 11/05/2019  Hearing? N  Vision? N  Difficulty concentrating or making decisions? N  Walking or climbing stairs? Y  Comment Due to left leg pains.  Dressing or bathing? N  Doing errands, shopping? N  Preparing Food and eating ? N  Using the Toilet? N  In the past six months, have you accidently leaked urine? N  Do you have problems with loss of bowel control? N  Managing your Medications? N  Managing your Finances? N  Housekeeping or managing your Housekeeping? N  Some recent data might be hidden    Patient Care Team: Paulene Floor as PCP - General (Physician Assistant) Pa, [facility]) Lonia Farber, MD as Consulting Physician (Internal Medicine)  Indicate any recent Medical Services you may have received from other than [facility] providers in the past year (date may be approximate).     Assessment:   This is a routine wellness examination for Joseph Luna.  Hearing/Vision screen No exam data present  Dietary issues and exercise activities discussed: Current Exercise Habits: The patient does not participate in regular exercise at present, Exercise limited by: orthopedic condition(s)  Goals      Patient Stated   .  I don't have any medication  insurance coverage" (pt-stated)      Current Barriers:  [name] Knowledge deficit related to where to find information on the cost of Medicare part D coverage  Nurse Case Manager Clinical Goal(s):  [name] Over the next 90 days, patient will make appointment with [facility] office to discuss options for Medicare part D coverage enrollment  Interventions:  . Provided patient with contact information for [facility] office: o [address] [place], [place] [address] o [phone]  Patient Self Care Activities:  . Self administers medications as prescribed . Attends all scheduled provider appointments . Calls pharmacy for medication refills . Attends church or other social activities . Performs ADL's independently . Performs IADL's independently . Calls provider office for new concerns or questions  Initial goal documentation     .  I have not had any diabetes education (pt-stated)      Current Barriers:  [name] Knowledge Deficits related to basic Diabetes pathophysiology and self care/management . Financial strain (lack of medication coverage)  Nurse Case Manager Clinical Goal(s):  [name] Over the next 30 days, patient will demonstrate improved adherence to prescribed treatment plan for diabetes self care/management as evidenced by checking blood glucose levels as prescribed, adhering to ADA/low carb diet, daily exercise, and medication adherence-goal met 12/03/2018 . Over the next 90 days, patient will demonstrate continued adherence to prescribed treatment plan for diabetes self care management as evidenced by checkinb blood glucose levels as prescribed, adhering to ADA/low carb diet, daily exercise, and medication adherence  Interventions:  . Assessed need for education reinforcement . Assessed for medication adherence . Discussed plans with patient for ongoing care management follow up and provided patient with direct contact information for care management  team . Reviewed recent CBG readings . Advised patient, providing education and rationale, to check cbg TID and record, calling Dr. Honor Junes for findings outside established parameters.    Patient Self Care Activities:  . Self administers medications as prescribed . Attends all scheduled provider appointments . Checks blood sugars as prescribed and utilize hyper and hypoglycemia protocol as needed . Adhere to ADA diet as discussed . Makes eye and dental appointments ASAP . Engage with [facility] Pharmacist for ongoing medication management and affordibility   Please see past updates related to this goal by clicking on the "Past Updates" button in the selected goal        Other   .  Quit Smoking      Recommend to continue efforts to reduce smoking habits until no longer smoking.       Depression Screen PHQ 2/9 Scores 11/05/2019 08/16/2018 07/30/2018  PHQ - 2 Score 0 0 0    Fall Risk Fall Risk  11/05/2019 08/16/2018  Falls in the past year? 0 1  Number falls in past yr: 0 0  Injury with Fall? 0 1  Risk for fall due to : - History of fall(s);Impaired balance/gait  Follow up - Falls evaluation completed;Falls prevention discussed;Education provided    Any stairs in or around the home? Yes  If so, are there any without handrails? No  Home free of loose throw rugs in walkways, pet beds, electrical cords, etc? Yes  Adequate lighting in your home to reduce risk of falls? Yes   ASSISTIVE DEVICES UTILIZED TO PREVENT FALLS:  Life alert? No  Use of a cane, walker or w/c? No  Grab bars in the bathroom? Yes  Shower chair or bench in shower? Yes  Elevated toilet seat or a handicapped toilet? Yes   Cognitive Function: Declined today.        Immunizations Immunization History  Administered Date(s) Administered  . Pneumococcal Polysaccharide-23 12/02/2018    TDAP status: Due, Education has been provided regarding the importance of this vaccine. Advised may receive this vaccine at  local pharmacy or Health Dept. Aware to provide a copy of the vaccination record if obtained from local pharmacy or Health Dept. Verbalized acceptance and understanding. Flu Vaccine status: Due fall 2021 Pneumococcal vaccine status: Up to date Covid-19 vaccine status: Completed vaccines  Qualifies for Shingles Vaccine? Yes   Zostavax completed No   Shingrix Completed?: No.    Education has been provided regarding the importance of this vaccine. Patient has been advised to call insurance company to determine out of pocket expense if they have not yet received this vaccine. Advised may also receive vaccine at local pharmacy or Health Dept. Verbalized acceptance and understanding.  Screening Tests Health Maintenance  Topic Date Due  . COVID-19 Vaccine (1) Never done  . Fecal DNA (Cologuard)  Never done  . FOOT EXAM  07/30/2019  . OPHTHALMOLOGY EXAM  10/02/2019  . Hepatitis C Screening  06/03/2020 (Originally 01/18/1951)  . TETANUS/TDAP  11/04/2020 (Originally 06/03/1969)  . INFLUENZA VACCINE  11/16/2019  . PNA vac Low Risk Adult (2 of 2 - PCV13) 12/02/2019  . HEMOGLOBIN A1C  02/05/2020  . URINE MICROALBUMIN  06/03/2020    Health Maintenance  Health Maintenance Due  Topic Date Due  . COVID-19 Vaccine (1) Never done  . Fecal DNA (Cologuard)  Never done  . FOOT EXAM  07/30/2019  . OPHTHALMOLOGY EXAM  10/02/2019    Colorectal cancer screening: Currently due. Declined colonoscopy referral and/or cologuard order at this time.   Lung Cancer Screening: (Low Dose CT Chest recommended if Age 45-80 years, 30 pack-year currently smoking OR have quit w/in 15years.) does qualify, however had a chest x-ray 06/2018. Repeat yearly.   Additional Screening:  Hepatitis C Screening: does qualify however declines a future order.  Vision Screening: Recommended annual ophthalmology exams for early detection of glaucoma and other disorders of the eye. Is the patient up to date with their annual eye  exam?  Yes  Who is the provider or what is the name of the office in which the patient attends annual eye exams? [facility] If pt is not established with a provider, would they like to be referred  to a provider to establish care? Yes .   Dental Screening: Recommended annual dental exams for proper oral hygiene  Community Resource Referral / Chronic Care Management: CRR required this visit?  No   CCM required this visit?  No      Plan:     I have personally reviewed and noted the following in the patient's chart:   . Medical and social history . Use of alcohol, tobacco or illicit drugs  . Current medications and supplements . Functional ability and status . Nutritional status . Physical activity . Advanced directives . List of other physicians . Hospitalizations, surgeries, and ER visits in previous 12 months . Vitals . Screenings to include cognitive, depression, and falls . Referrals and appointments  In addition, I have reviewed and discussed with patient certain preventive protocols, quality metrics, and best practice recommendations. A written personalized care plan for preventive services as well as general preventive health recommendations were provided to patient.     Timmi Devora [place], [place]   [id number]   Nurse Notes: Pt needs a diabetic foot exam at next in office apt. Requested that pt bring in his COVID vaccine card information to up date in chart. Pt declined a colonoscopy referral and a cologuard order. Pt plans to set up an eye exam this year.

## 2019-11-05 ENCOUNTER — Other Ambulatory Visit: Payer: Self-pay

## 2019-11-05 ENCOUNTER — Ambulatory Visit (INDEPENDENT_AMBULATORY_CARE_PROVIDER_SITE_OTHER): Payer: Medicare Other

## 2019-11-05 DIAGNOSIS — Z0000 Encounter for general adult medical examination without abnormal findings: Secondary | ICD-10-CM

## 2019-11-05 NOTE — Patient Instructions (Signed)
Mr. Full , Thank you for taking time to come for your Medicare Wellness Visit. I appreciate your ongoing commitment to your health goals. Please review the following plan we discussed and let me know if I can assist you in the future.   Screening recommendations/referrals: Colonoscopy: Currently due, declined a colonoscopy referral and cologuard order.  Recommended yearly ophthalmology/optometry visit for glaucoma screening and checkup Recommended yearly dental visit for hygiene and checkup  Vaccinations: Influenza vaccine: Due fall 2021 Pneumococcal vaccine: Prevnar 13 due 12/02/19 Tdap vaccine: Currently due, declined at this time. Shingles vaccine: Shingrix discussed. Please contact your pharmacy for coverage information.     Advanced directives: Advance directive discussed with you today. Even though you declined this today please call our office should you change your mind and we can give you the proper paperwork for you to fill out.  Conditions/risks identified: Smoking cessation discussed today.   Next appointment: 12/02/19 @ 1:20 PM with Osvaldo Angst. Declined scheduling an AWV for 2022 at this time.   Preventive Care 41 Years and Older, Male Preventive care refers to lifestyle choices and visits with your health care provider that can promote health and wellness. What does preventive care include?  A yearly physical exam. This is also called an annual well check.  Dental exams once or twice a year.  Routine eye exams. Ask your health care provider how often you should have your eyes checked.  Personal lifestyle choices, including:  Daily care of your teeth and gums.  Regular physical activity.  Eating a healthy diet.  Avoiding tobacco and drug use.  Limiting alcohol use.  Practicing safe sex.  Taking low doses of aspirin every day.  Taking vitamin and mineral supplements as recommended by your health care provider. What happens during an annual well  check? The services and screenings done by your health care provider during your annual well check will depend on your age, overall health, lifestyle risk factors, and family history of disease. Counseling  Your health care provider may ask you questions about your:  Alcohol use.  Tobacco use.  Drug use.  Emotional well-being.  Home and relationship well-being.  Sexual activity.  Eating habits.  History of falls.  Memory and ability to understand (cognition).  Work and work Astronomer. Screening  You may have the following tests or measurements:  Height, weight, and BMI.  Blood pressure.  Lipid and cholesterol levels. These may be checked every 5 years, or more frequently if you are over 38 years old.  Skin check.  Lung cancer screening. You may have this screening every year starting at age 70 if you have a 30-pack-year history of smoking and currently smoke or have quit within the past 15 years.  Fecal occult blood test (FOBT) of the stool. You may have this test every year starting at age 12.  Flexible sigmoidoscopy or colonoscopy. You may have a sigmoidoscopy every 5 years or a colonoscopy every 10 years starting at age 75.  Prostate cancer screening. Recommendations will vary depending on your family history and other risks.  Hepatitis C blood test.  Hepatitis B blood test.  Sexually transmitted disease (STD) testing.  Diabetes screening. This is done by checking your blood sugar (glucose) after you have not eaten for a while (fasting). You may have this done every 1-3 years.  Abdominal aortic aneurysm (AAA) screening. You may need this if you are a current or former smoker.  Osteoporosis. You may be screened starting at age 77 if  you are at high risk. Talk with your health care provider about your test results, treatment options, and if necessary, the need for more tests. Vaccines  Your health care provider may recommend certain vaccines, such  as:  Influenza vaccine. This is recommended every year.  Tetanus, diphtheria, and acellular pertussis (Tdap, Td) vaccine. You may need a Td booster every 10 years.  Zoster vaccine. You may need this after age 19.  Pneumococcal 13-valent conjugate (PCV13) vaccine. One dose is recommended after age 52.  Pneumococcal polysaccharide (PPSV23) vaccine. One dose is recommended after age 53. Talk to your health care provider about which screenings and vaccines you need and how often you need them. This information is not intended to replace advice given to you by your health care provider. Make sure you discuss any questions you have with your health care provider. Document Released: 04/30/2015 Document Revised: 12/22/2015 Document Reviewed: 02/02/2015 Elsevier Interactive Patient Education  2017 [employer].  Fall Prevention in the Home Falls can cause injuries. They can happen to people of all ages. There are many things you can do to make your home safe and to help prevent falls. What can I do on the outside of my home?  Regularly fix the edges of walkways and driveways and fix any cracks.  Remove anything that might make you trip as you walk through a door, such as a raised step or threshold.  Trim any bushes or trees on the path to your home.  Use bright outdoor lighting.  Clear any walking paths of anything that might make someone trip, such as rocks or tools.  Regularly check to see if handrails are loose or broken. Make sure that both sides of any steps have handrails.  Any raised decks and porches should have guardrails on the edges.  Have any leaves, snow, or ice cleared regularly.  Use sand or salt on walking paths during winter.  Clean up any spills in your garage right away. This includes oil or grease spills. What can I do in the bathroom?  Use night lights.  Install grab bars by the toilet and in the tub and shower. Do not use towel bars as grab bars.  Use  non-skid mats or decals in the tub or shower.  If you need to sit down in the shower, use a plastic, non-slip stool.  Keep the floor dry. Clean up any water that spills on the floor as soon as it happens.  Remove soap buildup in the tub or shower regularly.  Attach bath mats securely with double-sided non-slip rug tape.  Do not have throw rugs and other things on the floor that can make you trip. What can I do in the bedroom?  Use night lights.  Make sure that you have a light by your bed that is easy to reach.  Do not use any sheets or blankets that are too big for your bed. They should not hang down onto the floor.  Have a firm chair that has side arms. You can use this for support while you get dressed.  Do not have throw rugs and other things on the floor that can make you trip. What can I do in the kitchen?  Clean up any spills right away.  Avoid walking on wet floors.  Keep items that you use a lot in easy-to-reach places.  If you need to reach something above you, use a strong step stool that has a grab bar.  Keep electrical cords  out of the way.  Do not use floor polish or wax that makes floors slippery. If you must use wax, use non-skid floor wax.  Do not have throw rugs and other things on the floor that can make you trip. What can I do with my stairs?  Do not leave any items on the stairs.  Make sure that there are handrails on both sides of the stairs and use them. Fix handrails that are broken or loose. Make sure that handrails are as long as the stairways.  Check any carpeting to make sure that it is firmly attached to the stairs. Fix any carpet that is loose or worn.  Avoid having throw rugs at the top or bottom of the stairs. If you do have throw rugs, attach them to the floor with carpet tape.  Make sure that you have a light switch at the top of the stairs and the bottom of the stairs. If you do not have them, ask someone to add them for you. What  else can I do to help prevent falls?  Wear shoes that:  Do not have high heels.  Have rubber bottoms.  Are comfortable and fit you well.  Are closed at the toe. Do not wear sandals.  If you use a stepladder:  Make sure that it is fully opened. Do not climb a closed stepladder.  Make sure that both sides of the stepladder are locked into place.  Ask someone to hold it for you, if possible.  Clearly mark and make sure that you can see:  Any grab bars or handrails.  First and last steps.  Where the edge of each step is.  Use tools that help you move around (mobility aids) if they are needed. These include:  Canes.  Walkers.  Scooters.  Crutches.  Turn on the lights when you go into a dark area. Replace any light bulbs as soon as they burn out.  Set up your furniture so you have a clear path. Avoid moving your furniture around.  If any of your floors are uneven, fix them.  If there are any pets around you, be aware of where they are.  Review your medicines with your doctor. Some medicines can make you feel dizzy. This can increase your chance of falling. Ask your doctor what other things that you can do to help prevent falls. This information is not intended to replace advice given to you by your health care provider. Make sure you discuss any questions you have with your health care provider. Document Released: 01/28/2009 Document Revised: 09/09/2015 Document Reviewed: 05/08/2014 Elsevier Interactive Patient Education  2017 [employer].

## 2019-12-01 NOTE — Progress Notes (Signed)
Established patient visit   Patient: DANDRE SISLER   DOB: 08-03-1950   69 y.o. Male  MRN: [id number] Visit Date: 12/02/2019  Today's healthcare provider: Trey Sailors, PA-C   Chief Complaint  Patient presents with  . Diabetes  I,Hero Kulish M Arnol Mcgibbon,acting as a scribe for [employer], PA-C.,have documented all relevant documentation on the behalf of Trey Sailors, PA-C,as directed by  Trey Sailors, PA-C while in the presence of Trey Sailors, PA-C.  Subjective    HPI   Patient presents today for a CPE and follow up. Has not completed colon cancer screening. He has been sent in cologuard but has not yet completed.    Diabetes Mellitus Type II, Follow-up  Lab Results  Component Value Date   HGBA1C 5.9 (A) 12/02/2019   HGBA1C 5.7 (A) 12/02/2018   HGBA1C 9.5 (A) 07/30/2018   Wt Readings from Last 3 Encounters:  12/02/19 206 lb 12.8 oz (93.8 kg)  06/04/19 207 lb 6.4 oz (94.1 kg)  12/02/18 184 lb 3.2 oz (83.6 kg)   Last seen for diabetes 6 months ago.  Management since then includes no changes. He reports good compliance with treatment. He is not having side effects.  Symptoms: No fatigue No foot ulcerations  No appetite changes No nausea  No paresthesia of the feet  No polydipsia  No polyuria No visual disturbances   No vomiting     Home blood sugar records: fasting range: 130's-160's  Episodes of hypoglycemia? No    Current insulin regiment:Humulin 21 units into the skin 2 times daily. Most Recent Eye Exam: 09/2018 at [facility] Current exercise: no regular exercise Current diet habits: well balanced  Pertinent Labs: Lab Results  Component Value Date   CHOL 94 (L) 06/04/2019   HDL 30 (L) 06/04/2019   LDLCALC 44 06/04/2019   TRIG 109 06/04/2019   CHOLHDL 3.1 06/04/2019   Lab Results  Component Value Date   NA 142 06/04/2019   K 4.4 06/04/2019   CREATININE 1.17 06/04/2019   GFRNONAA 64 06/04/2019   GFRAA 74 06/04/2019    GLUCOSE 113 (H) 06/04/2019     Lipid/Cholesterol, Follow-up  Last lipid panel Other pertinent labs  Lab Results  Component Value Date   CHOL 94 (L) 06/04/2019   HDL 30 (L) 06/04/2019   LDLCALC 44 06/04/2019   TRIG 109 06/04/2019   CHOLHDL 3.1 06/04/2019   Lab Results  Component Value Date   ALT 19 06/04/2019   AST 17 06/04/2019   PLT 246 07/30/2018   TSH 1.420 07/30/2018     He was last seen for this 6 months ago.  Management since that visit includes continue statin.  He reports excellent compliance with treatment. He is not having side effects.   Symptoms: No chest pain No chest pressure/discomfort  No dyspnea No lower extremity edema  No numbness or tingling of extremity No orthopnea  No palpitations No paroxysmal nocturnal dyspnea  No speech difficulty No syncope   Current diet: not asked Current exercise: aerobics  The ASCVD Risk score Denman George DC Jr., et al., 2013) failed to calculate for the following reasons:   The patient has a prior MI or stroke diagnosis  Periphernal Neuropathy: continues with left sided neuropathy. Continues to manage this with gabapentin. Has declined interventions from physiatry previously.  ---------------------------------------------------------------------------------------------------       Medications: Outpatient Medications Prior to Visit  Medication Sig  . acetaminophen (TYLENOL) 500 MG tablet Take  500 mg by mouth every 6 (six) hours as needed.  [name] aspirin EC 81 MG EC tablet Take 1 tablet (81 mg total) by mouth daily.  [name] atorvastatin (LIPITOR) 10 MG tablet Take 1 tablet (10 mg total) by mouth daily.  [name] gabapentin (NEURONTIN) 300 MG capsule TAKE 1 CAPSULE BY MOUTH THREE TIMES DAILY  . Insulin Isophane & Regular Human (NOVOLIN 70/30 FLEXPEN RELION) (70-30) 100 UNIT/ML PEN Inject 21 Units into the skin 2 (two) times daily with a meal.  . methimazole (TAPAZOLE) 10 MG tablet Take 10 mg by mouth daily. Two tablets daily on M,W,F and  1 tablets all other days.  . metoprolol tartrate (LOPRESSOR) 25 MG tablet Take 0.5 tablets (12.5 mg total) by mouth 2 (two) times daily.   No facility-administered medications prior to visit.    Review of Systems  Constitutional: Negative.   Respiratory: Negative.   Cardiovascular: Negative.   Endocrine: Negative.   Musculoskeletal: Negative.   Neurological: Negative.       Objective    BP 132/70 (BP Location: Left Arm, Patient Position: Sitting, Cuff Size: Normal)   Pulse 91   Temp 98.6 F (37 C) (Oral)   Wt 206 lb 12.8 oz (93.8 kg)   BMI 27.28 kg/m    Physical Exam Constitutional:      Appearance: Normal appearance.  Cardiovascular:     Rate and Rhythm: Normal rate and regular rhythm.     Heart sounds: Normal heart sounds.  Pulmonary:     Effort: Pulmonary effort is normal.     Breath sounds: Normal breath sounds.  Abdominal:     General: Bowel sounds are normal.     Palpations: Abdomen is soft.  Skin:    General: Skin is warm and dry.  Neurological:     Mental Status: He is alert and oriented to person, place, and time. Mental status is at baseline.  Psychiatric:        Mood and Affect: Mood normal.        Behavior: Behavior normal.       Results for orders placed or performed in visit on 12/02/19  POCT glycosylated hemoglobin (Hb A1C)  Result Value Ref Range   Hemoglobin A1C 5.9 (A) 4.0 - 5.6 %   HbA1c POC (<> result, manual entry)     HbA1c, POC (prediabetic range)     HbA1c, POC (controlled diabetic range)     Est. average glucose Bld gHb Est-mCnc 123     Assessment & Plan    1. Annual physical exam  He has been reminded to complete his cologuard.   2. Type 2 diabetes mellitus with ketoacidotic coma, unspecified whether long term insulin use (HCC)  Followed by endocrinology. Referred for eye exam.   - POCT glycosylated hemoglobin (Hb A1C) - Ambulatory referral to Ophthalmology  3. Hyperthyroidism  Followed by endocrinology.  4. Colon  cancer screening  - Cologuard  5. Need for vaccination against Streptococcus pneumoniae  - Pneumococcal conjugate vaccine 13-valent  6. Prostate cancer screening   7. Hyperlipidemia associated with type 2 diabetes mellitus (HCC)  Continue statin.   No follow-ups on file.      ITrey Sailors, PA-C, have reviewed all documentation for this visit. The documentation on 12/16/19 for the exam, diagnosis, procedures, and orders are all accurate and complete.  The entirety of the information documented in the History of Present Illness, Review of Systems and Physical Exam were personally obtained by me. Portions of this information  were initially documented by [facility] and reviewed by me for thoroughness and accuracy.     Maryella Shivers  [facility] [phone] (phone) [phone] (fax)  [facility]

## 2019-12-02 ENCOUNTER — Other Ambulatory Visit: Payer: Self-pay

## 2019-12-02 ENCOUNTER — Encounter: Payer: Self-pay | Admitting: Physician Assistant

## 2019-12-02 ENCOUNTER — Ambulatory Visit (INDEPENDENT_AMBULATORY_CARE_PROVIDER_SITE_OTHER): Payer: Medicare Other | Admitting: Physician Assistant

## 2019-12-02 VITALS — BP 132/70 | HR 91 | Temp 98.6°F | Wt 206.8 lb

## 2019-12-02 DIAGNOSIS — E1169 Type 2 diabetes mellitus with other specified complication: Secondary | ICD-10-CM | POA: Diagnosis not present

## 2019-12-02 DIAGNOSIS — E0590 Thyrotoxicosis, unspecified without thyrotoxic crisis or storm: Secondary | ICD-10-CM | POA: Diagnosis not present

## 2019-12-02 DIAGNOSIS — Z125 Encounter for screening for malignant neoplasm of prostate: Secondary | ICD-10-CM

## 2019-12-02 DIAGNOSIS — E1111 Type 2 diabetes mellitus with ketoacidosis with coma: Secondary | ICD-10-CM

## 2019-12-02 DIAGNOSIS — Z1211 Encounter for screening for malignant neoplasm of colon: Secondary | ICD-10-CM | POA: Diagnosis not present

## 2019-12-02 DIAGNOSIS — Z0000 Encounter for general adult medical examination without abnormal findings: Secondary | ICD-10-CM | POA: Diagnosis not present

## 2019-12-02 DIAGNOSIS — Z23 Encounter for immunization: Secondary | ICD-10-CM | POA: Diagnosis not present

## 2019-12-02 DIAGNOSIS — E785 Hyperlipidemia, unspecified: Secondary | ICD-10-CM | POA: Diagnosis not present

## 2019-12-02 LAB — POCT GLYCOSYLATED HEMOGLOBIN (HGB A1C)
Est. average glucose Bld gHb Est-mCnc: 123
Hemoglobin A1C: 5.9 % — AB (ref 4.0–5.6)

## 2019-12-02 NOTE — Patient Instructions (Signed)
Diabetes Mellitus and Exercise Exercising regularly is important for your overall health, especially when you have diabetes (diabetes mellitus). Exercising is not only about losing weight. It has many other health benefits, such as increasing muscle strength and bone density and reducing body fat and stress. This leads to improved fitness, flexibility, and endurance, all of which result in better overall health. Exercise has additional benefits for people with diabetes, including:  Reducing appetite.  Helping to lower and control blood glucose.  Lowering blood pressure.  Helping to control amounts of fatty substances (lipids) in the blood, such as cholesterol and triglycerides.  Helping the body to respond better to insulin (improving insulin sensitivity).  Reducing how much insulin the body needs.  Decreasing the risk for heart disease by: ? Lowering cholesterol and triglyceride levels. ? Increasing the levels of good cholesterol. ? Lowering blood glucose levels. What is my activity plan? Your health care provider or certified diabetes educator can help you make a plan for the type and frequency of exercise (activity plan) that works for you. Make sure that you:  Do at least 150 minutes of moderate-intensity or vigorous-intensity exercise each week. This could be brisk walking, biking, or water aerobics. ? Do stretching and strength exercises, such as yoga or weightlifting, at least 2 times a week. ? Spread out your activity over at least 3 days of the week.  Get some form of physical activity every day. ? Do not go more than 2 days in a row without some kind of physical activity. ? Avoid being inactive for more than 30 minutes at a time. Take frequent breaks to walk or stretch.  Choose a type of exercise or activity that you enjoy, and set realistic goals.  Start slowly, and gradually increase the intensity of your exercise over time. What do I need to know about managing my  diabetes?   Check your blood glucose before and after exercising. ? If your blood glucose is 240 mg/dL (13.3 mmol/L) or higher before you exercise, check your urine for ketones. If you have ketones in your urine, do not exercise until your blood glucose returns to normal. ? If your blood glucose is 100 mg/dL (5.6 mmol/L) or lower, eat a snack containing 15-20 grams of carbohydrate. Check your blood glucose 15 minutes after the snack to make sure that your level is above 100 mg/dL (5.6 mmol/L) before you start your exercise.  Know the symptoms of low blood glucose (hypoglycemia) and how to treat it. Your risk for hypoglycemia increases during and after exercise. Common symptoms of hypoglycemia can include: ? Hunger. ? Anxiety. ? Sweating and feeling clammy. ? Confusion. ? Dizziness or feeling light-headed. ? Increased heart rate or palpitations. ? Blurry vision. ? Tingling or numbness around the mouth, lips, or tongue. ? Tremors or shakes. ? Irritability.  Keep a rapid-acting carbohydrate snack available before, during, and after exercise to help prevent or treat hypoglycemia.  Avoid injecting insulin into areas of the body that are going to be exercised. For example, avoid injecting insulin into: ? The arms, when playing tennis. ? The legs, when jogging.  Keep records of your exercise habits. Doing this can help you and your health care provider adjust your diabetes management plan as needed. Write down: ? Food that you eat before and after you exercise. ? Blood glucose levels before and after you exercise. ? The type and amount of exercise you have done. ? When your insulin is expected to peak, if you use   insulin. Avoid exercising at times when your insulin is peaking.  When you start a new exercise or activity, work with your health care provider to make sure the activity is safe for you, and to adjust your insulin, medicines, or food intake as needed.  Drink plenty of water while  you exercise to prevent dehydration or heat stroke. Drink enough fluid to keep your urine clear or pale yellow. Summary  Exercising regularly is important for your overall health, especially when you have diabetes (diabetes mellitus).  Exercising has many health benefits, such as increasing muscle strength and bone density and reducing body fat and stress.  Your health care provider or certified diabetes educator can help you make a plan for the type and frequency of exercise (activity plan) that works for you.  When you start a new exercise or activity, work with your health care provider to make sure the activity is safe for you, and to adjust your insulin, medicines, or food intake as needed. This information is not intended to replace advice given to you by your health care provider. Make sure you discuss any questions you have with your health care provider. Document Revised: 10/26/2016 Document Reviewed: 09/13/2015 Elsevier Patient Education  2020 [employer]  

## 2019-12-03 DIAGNOSIS — E1165 Type 2 diabetes mellitus with hyperglycemia: Secondary | ICD-10-CM | POA: Diagnosis not present

## 2019-12-03 DIAGNOSIS — Z794 Long term (current) use of insulin: Secondary | ICD-10-CM | POA: Diagnosis not present

## 2019-12-03 DIAGNOSIS — E0590 Thyrotoxicosis, unspecified without thyrotoxic crisis or storm: Secondary | ICD-10-CM | POA: Diagnosis not present

## 2019-12-16 DIAGNOSIS — E1169 Type 2 diabetes mellitus with other specified complication: Secondary | ICD-10-CM | POA: Insufficient documentation

## 2019-12-16 DIAGNOSIS — E785 Hyperlipidemia, unspecified: Secondary | ICD-10-CM | POA: Insufficient documentation

## 2020-01-14 ENCOUNTER — Telehealth: Payer: Self-pay

## 2020-01-14 NOTE — Telephone Encounter (Signed)
Copied from CRM [id number]. Topic: General - Call Back - No Documentation >> Jan 14, 2020 11:22 AM Marylen Ponto wrote: Reason for CRM: Pt stated he just had a missed call from the office. Pt requests call back

## 2020-01-14 NOTE — Telephone Encounter (Signed)
Called patient and no answer. LVM advising patient that no one from the office called him.

## 2020-02-06 ENCOUNTER — Other Ambulatory Visit: Payer: Self-pay | Admitting: Physician Assistant

## 2020-02-06 DIAGNOSIS — G629 Polyneuropathy, unspecified: Secondary | ICD-10-CM

## 2020-03-10 ENCOUNTER — Other Ambulatory Visit: Payer: Self-pay | Admitting: Physician Assistant

## 2020-03-10 DIAGNOSIS — G629 Polyneuropathy, unspecified: Secondary | ICD-10-CM

## 2020-06-01 ENCOUNTER — Other Ambulatory Visit: Payer: Self-pay | Admitting: Physician Assistant

## 2020-06-01 DIAGNOSIS — E1111 Type 2 diabetes mellitus with ketoacidosis with coma: Secondary | ICD-10-CM

## 2020-06-07 ENCOUNTER — Ambulatory Visit (INDEPENDENT_AMBULATORY_CARE_PROVIDER_SITE_OTHER): Payer: Medicare Other | Admitting: Physician Assistant

## 2020-06-07 ENCOUNTER — Encounter: Payer: Self-pay | Admitting: Physician Assistant

## 2020-06-07 ENCOUNTER — Other Ambulatory Visit: Payer: Self-pay

## 2020-06-07 VITALS — BP 132/75 | HR 84 | Temp 98.4°F | Wt 218.0 lb

## 2020-06-07 DIAGNOSIS — E785 Hyperlipidemia, unspecified: Secondary | ICD-10-CM | POA: Diagnosis not present

## 2020-06-07 DIAGNOSIS — D649 Anemia, unspecified: Secondary | ICD-10-CM | POA: Diagnosis not present

## 2020-06-07 DIAGNOSIS — E0590 Thyrotoxicosis, unspecified without thyrotoxic crisis or storm: Secondary | ICD-10-CM | POA: Diagnosis not present

## 2020-06-07 DIAGNOSIS — I420 Dilated cardiomyopathy: Secondary | ICD-10-CM

## 2020-06-07 DIAGNOSIS — E1140 Type 2 diabetes mellitus with diabetic neuropathy, unspecified: Secondary | ICD-10-CM

## 2020-06-07 DIAGNOSIS — G4700 Insomnia, unspecified: Secondary | ICD-10-CM | POA: Diagnosis not present

## 2020-06-07 DIAGNOSIS — E1169 Type 2 diabetes mellitus with other specified complication: Secondary | ICD-10-CM

## 2020-06-07 NOTE — Progress Notes (Signed)
Established patient visit   Patient: Joseph Luna   DOB: March 01, 1951   70 y.o. Male  MRN: [id number] Visit Date: 06/07/2020  Today's healthcare provider: Trey SailorsAdriana M Shronda Boeh, PA-C   Chief Complaint  Patient presents with  . Diabetes  I,Davinity Fanara M Ilse Billman,acting as a scribe for [employer] Corporationdriana M Shivan Hodes, PA-C.,have documented all relevant documentation on the behalf of Trey Sailorsdriana M [place]oder, PA-C,as directed by  Trey SailorsAdriana M Srishti Strnad, PA-C while in the presence of Trey SailorsAdriana M Tyress Loden, PA-C.  Subjective    HPI  Diabetes Mellitus Type II, Follow-up  Lab Results  Component Value Date   HGBA1C 6.1 (H) 06/07/2020   HGBA1C 5.9 (A) 12/02/2019   HGBA1C 5.7 (A) 12/02/2018   Wt Readings from Last 3 Encounters:  06/07/20 218 lb (98.9 kg)  12/02/19 206 lb 12.8 oz (93.8 kg)  06/04/19 207 lb 6.4 oz (94.1 kg)   Last seen for diabetes 6 months ago.  Management since then includes followed by endocrinology, but has not been in a while. He reports good compliance with treatment. He is not having side effects.  Symptoms: No fatigue No foot ulcerations  No appetite changes No nausea  No paresthesia of the feet  No polydipsia  No polyuria No visual disturbances   No vomiting     Home blood sugar records: fasting range: 90's-130's  Episodes of hypoglycemia? No    Current insulin regiment:Humulin Most Recent Eye Exam: 10/02/2018. Says he went back from 11/2019 to [facility] but left because the wait was too long.  Current exercise: no regular exercise Current diet habits: well balanced  Pertinent Labs: Lab Results  Component Value Date   CHOL 135 06/07/2020   HDL 28 (L) 06/07/2020   LDLCALC 49 06/07/2020   TRIG 390 (H) 06/07/2020   CHOLHDL 4.8 06/07/2020   Lab Results  Component Value Date   NA 140 06/07/2020   K 4.9 06/07/2020   CREATININE 1.20 06/07/2020   GFRNONAA 61 06/07/2020   GFRAA 71 06/07/2020   GLUCOSE 77 06/07/2020      --------------------------------------------------------------------------------------------------- Lipid/Cholesterol, Follow-up  Last lipid panel Other pertinent labs  Lab Results  Component Value Date   CHOL 135 06/07/2020   HDL 28 (L) 06/07/2020   LDLCALC 49 06/07/2020   TRIG 390 (H) 06/07/2020   CHOLHDL 4.8 06/07/2020   Lab Results  Component Value Date   ALT 17 06/07/2020   AST 18 06/07/2020   PLT 189 06/07/2020   TSH 0.969 06/07/2020     He was last seen for this 6 months ago.  Management since that visit includes continue statin.  He reports excellent compliance with treatment. He is not having side effects.   Symptoms: No chest pain No chest pressure/discomfort  No dyspnea No lower extremity edema  No numbness or tingling of extremity No orthopnea  No palpitations No paroxysmal nocturnal dyspnea  No speech difficulty No syncope   Current diet: well balanced Current exercise: aerobics  The ASCVD Risk score Denman George(Goff DC Jr., et al., 2013) failed to calculate for the following reasons:   The patient has a prior MI or stroke diagnosis  ---------------------------------------------------------------------------------------------------  Hypertension, follow-up  BP Readings from Last 3 Encounters:  06/07/20 132/75  12/02/19 132/70  06/04/19 132/74   Wt Readings from Last 3 Encounters:  06/07/20 218 lb (98.9 kg)  12/02/19 206 lb 12.8 oz (93.8 kg)  06/04/19 207 lb 6.4 oz (94.1 kg)     He was last seen for hypertension 6  months ago.  BP at that visit was normal. Management since that visit includes continue medications.  He reports excellent compliance with treatment. He is not having side effects.  He is following a Regular diet. He is exercising. He does smoke.  Use of agents associated with hypertension: none.   Outside blood pressures are not checked. Symptoms: No chest pain No chest pressure  No palpitations No syncope  No dyspnea No orthopnea   No paroxysmal nocturnal dyspnea No lower extremity edema   Pertinent labs: Lab Results  Component Value Date   CHOL 135 06/07/2020   HDL 28 (L) 06/07/2020   LDLCALC 49 06/07/2020   TRIG 390 (H) 06/07/2020   CHOLHDL 4.8 06/07/2020   Lab Results  Component Value Date   NA 140 06/07/2020   K 4.9 06/07/2020   CREATININE 1.20 06/07/2020   GFRNONAA 61 06/07/2020   GFRAA 71 06/07/2020   GLUCOSE 77 06/07/2020     The ASCVD Risk score Denman George DC Jr., et al., 2013) failed to calculate for the following reasons:   The patient has a prior MI or stroke diagnosis   ---------------------------------------------------------------------------------------------------  Reports he didn't get around to completing the cologuard. Says when he had a stroke they checked everything so it should have been done then.   Reports he has been having trouble sleeping recently. Can fall asleep but will not stay asleep. Will feel tired frequently and doze off during the day. Has had a sleep study twenty years ago but did not complete it. Has tried melatonin without success.      Medications: Outpatient Medications Prior to Visit  Medication Sig  . acetaminophen (TYLENOL) 500 MG tablet Take 500 mg by mouth every 6 (six) hours as needed.  [name] aspirin EC 81 MG EC tablet Take 1 tablet (81 mg total) by mouth daily.  [name] atorvastatin (LIPITOR) 10 MG tablet Take 1 tablet by mouth once daily  . gabapentin (NEURONTIN) 300 MG capsule TAKE 1 CAPSULE BY MOUTH THREE TIMES DAILY  . Insulin Isophane & Regular Human (NOVOLIN 70/30 FLEXPEN RELION) (70-30) 100 UNIT/ML PEN Inject 21 Units into the skin 2 (two) times daily with a meal.  . methimazole (TAPAZOLE) 10 MG tablet Take 10 mg by mouth daily. Two tablets daily on M,W,F and 1 tablets all other days.  . metoprolol tartrate (LOPRESSOR) 25 MG tablet Take 0.5 tablets (12.5 mg total) by mouth 2 (two) times daily.   No facility-administered medications prior to visit.    Review  of Systems     Objective    BP 132/75 (BP Location: Left Arm, Patient Position: Sitting, Cuff Size: Normal)   Pulse 84   Temp 98.4 F (36.9 C) (Oral)   Wt 218 lb (98.9 kg)   SpO2 99%   BMI 28.76 kg/m     Physical Exam Constitutional:      Appearance: Normal appearance.  Cardiovascular:     Rate and Rhythm: Normal rate and regular rhythm.     Heart sounds: Normal heart sounds.  Pulmonary:     Effort: Pulmonary effort is normal.     Breath sounds: Normal breath sounds.  Skin:    General: Skin is warm and dry.  Neurological:     Mental Status: He is alert and oriented to person, place, and time. Mental status is at baseline.  Psychiatric:        Mood and Affect: Mood normal.        Behavior: Behavior normal.  Results for orders placed or performed in visit on 06/07/20  TSH  Result Value Ref Range   TSH 0.969 0.450 - 4.500 uIU/mL  Lipid panel  Result Value Ref Range   Cholesterol, Total 135 100 - 199 mg/dL   Triglycerides 725 (H) 0 - 149 mg/dL   HDL 28 (L) >36 mg/dL   VLDL Cholesterol Cal 58 (H) 5 - 40 mg/dL   LDL Chol Calc (NIH) 49 0 - 99 mg/dL   Chol/HDL Ratio 4.8 0.0 - 5.0 ratio  Comprehensive metabolic panel  Result Value Ref Range   Glucose 77 65 - 99 mg/dL   BUN 19 8 - 27 mg/dL   Creatinine, Ser 6.44 0.76 - 1.27 mg/dL   GFR calc non Af Amer 61 >59 mL/min/1.73   GFR calc Af Amer 71 >59 mL/min/1.73   BUN/Creatinine Ratio 16 10 - 24   Sodium 140 134 - 144 mmol/L   Potassium 4.9 3.5 - 5.2 mmol/L   Chloride 103 96 - 106 mmol/L   CO2 18 (L) 20 - 29 mmol/L   Calcium 9.3 8.6 - 10.2 mg/dL   Total Protein 7.0 6.0 - 8.5 g/dL   Albumin 4.7 3.8 - 4.8 g/dL   Globulin, Total 2.3 1.5 - 4.5 g/dL   Albumin/Globulin Ratio 2.0 1.2 - 2.2   Bilirubin Total 0.3 0.0 - 1.2 mg/dL   Alkaline Phosphatase 49 44 - 121 IU/L   AST 18 0 - 40 IU/L   ALT 17 0 - 44 IU/L  CBC with Differential/Platelet  Result Value Ref Range   WBC 9.5 3.4 - 10.8 x10E3/uL   RBC 4.78 4.14 -  5.80 x10E6/uL   Hemoglobin 14.7 13.0 - 17.7 g/dL   Hematocrit 03.4 74.2 - 51.0 %   MCV 92 79 - 97 fL   MCH 30.8 26.6 - 33.0 pg   MCHC 33.6 31.5 - 35.7 g/dL   RDW 59.5 63.8 - 75.6 %   Platelets 189 150 - 450 x10E3/uL   Neutrophils 65 Not Estab. %   Lymphs 24 Not Estab. %   Monocytes 7 Not Estab. %   Eos 3 Not Estab. %   Basos 1 Not Estab. %   Neutrophils Absolute 6.2 1.4 - 7.0 x10E3/uL   Lymphocytes Absolute 2.2 0.7 - 3.1 x10E3/uL   Monocytes Absolute 0.7 0.1 - 0.9 x10E3/uL   EOS (ABSOLUTE) 0.3 0.0 - 0.4 x10E3/uL   Basophils Absolute 0.1 0.0 - 0.2 x10E3/uL   Immature Granulocytes 0 Not Estab. %   Immature Grans (Abs) 0.0 0.0 - 0.1 x10E3/uL  HgB A1c  Result Value Ref Range   Hgb A1c MFr Bld 6.1 (H) 4.8 - 5.6 %   Est. average glucose Bld gHb Est-mCnc 128 mg/dL  Urine Microalbumin w/creat. ratio  Result Value Ref Range   Creatinine, Urine WILL FOLLOW    Microalbumin, Urine WILL FOLLOW    Microalb/Creat Ratio WILL FOLLOW   Vitamin B12  Result Value Ref Range   Vitamin B-12 358 232 - 1,245 pg/mL    Assessment & Plan    1. Hyperlipidemia associated with type 2 diabetes mellitus (HCC)  Continue statins.   - Lipid panel - Comprehensive metabolic panel - CBC with Differential/Platelet  2. Type 2 diabetes mellitus with diabetic neuropathy, unspecified whether long term insulin use (HCC)  Continue diabetes medications.   - HgB A1c - Urine Microalbumin w/creat. ratio - Ambulatory referral to Ophthalmology - Ambulatory referral to Podiatry  3. Hyperthyroidism  - TSH  4. Dilated  cardiomyopathy (HCC)   5. Insomnia, unspecified type  Suspect sleep apnea, recommend sleep study which patient declines. He would prefer to observe.   6. Anemia, unspecified type  - B12   Return in about 6 months (around 12/05/2020).      ITrey Sailors, PA-C, have reviewed all documentation for this visit. The documentation on 06/08/20 for the exam, diagnosis, procedures, and  orders are all accurate and complete.  The entirety of the information documented in the History of Present Illness, Review of Systems and Physical Exam were personally obtained by me. Portions of this information were initially documented by [facility] and reviewed by me for thoroughness and accuracy.     Maryella Shivers  [facility] [phone] (phone) [phone] (fax)  [facility]

## 2020-06-08 LAB — COMPREHENSIVE METABOLIC PANEL
ALT: 17 IU/L (ref 0–44)
AST: 18 IU/L (ref 0–40)
Albumin/Globulin Ratio: 2 (ref 1.2–2.2)
Albumin: 4.7 g/dL (ref 3.8–4.8)
Alkaline Phosphatase: 49 IU/L (ref 44–121)
BUN/Creatinine Ratio: 16 (ref 10–24)
BUN: 19 mg/dL (ref 8–27)
Bilirubin Total: 0.3 mg/dL (ref 0.0–1.2)
CO2: 18 mmol/L — ABNORMAL LOW (ref 20–29)
Calcium: 9.3 mg/dL (ref 8.6–10.2)
Chloride: 103 mmol/L (ref 96–106)
Creatinine, Ser: 1.2 mg/dL (ref 0.76–1.27)
GFR calc Af Amer: 71 mL/min/{1.73_m2} (ref >59)
GFR calc non Af Amer: 61 mL/min/{1.73_m2} (ref >59)
Globulin, Total: 2.3 g/dL (ref 1.5–4.5)
Glucose: 77 mg/dL (ref 65–99)
Potassium: 4.9 mmol/L (ref 3.5–5.2)
Sodium: 140 mmol/L (ref 134–144)
Total Protein: 7 g/dL (ref 6.0–8.5)

## 2020-06-08 LAB — CBC WITH DIFFERENTIAL/PLATELET
Basophils Absolute: 0.1 10*3/uL (ref 0.0–0.2)
Basos: 1 %
EOS (ABSOLUTE): 0.3 10*3/uL (ref 0.0–0.4)
Eos: 3 %
Hematocrit: 43.8 % (ref 37.5–51.0)
Hemoglobin: 14.7 g/dL (ref 13.0–17.7)
Immature Grans (Abs): 0 10*3/uL (ref 0.0–0.1)
Immature Granulocytes: 0 %
Lymphocytes Absolute: 2.2 10*3/uL (ref 0.7–3.1)
Lymphs: 24 %
MCH: 30.8 pg (ref 26.6–33.0)
MCHC: 33.6 g/dL (ref 31.5–35.7)
MCV: 92 fL (ref 79–97)
Monocytes Absolute: 0.7 10*3/uL (ref 0.1–0.9)
Monocytes: 7 %
Neutrophils Absolute: 6.2 10*3/uL (ref 1.4–7.0)
Neutrophils: 65 %
Platelets: 189 10*3/uL (ref 150–450)
RBC: 4.78 x10E6/uL (ref 4.14–5.80)
RDW: 13.2 % (ref 11.6–15.4)
WBC: 9.5 10*3/uL (ref 3.4–10.8)

## 2020-06-08 LAB — HEMOGLOBIN A1C
Est. average glucose Bld gHb Est-mCnc: 128 mg/dL
Hgb A1c MFr Bld: 6.1 % — ABNORMAL HIGH (ref 4.8–5.6)

## 2020-06-08 LAB — LIPID PANEL
Chol/HDL Ratio: 4.8 ratio (ref 0.0–5.0)
Cholesterol, Total: 135 mg/dL (ref 100–199)
HDL: 28 mg/dL — ABNORMAL LOW (ref >39)
LDL Chol Calc (NIH): 49 mg/dL (ref 0–99)
Triglycerides: 390 mg/dL — ABNORMAL HIGH (ref 0–149)
VLDL Cholesterol Cal: 58 mg/dL — ABNORMAL HIGH (ref 5–40)

## 2020-06-08 LAB — MICROALBUMIN / CREATININE URINE RATIO
Creatinine, Urine: 119.3 mg/dL
Microalb/Creat Ratio: 4 mg/g{creat} (ref 0–29)
Microalbumin, Urine: 4.7 ug/mL

## 2020-06-08 LAB — VITAMIN B12: Vitamin B-12: 358 pg/mL (ref 232–1245)

## 2020-06-08 LAB — TSH: TSH: 0.969 u[IU]/mL (ref 0.450–4.500)

## 2020-06-09 DIAGNOSIS — E1165 Type 2 diabetes mellitus with hyperglycemia: Secondary | ICD-10-CM | POA: Diagnosis not present

## 2020-06-09 DIAGNOSIS — E0590 Thyrotoxicosis, unspecified without thyrotoxic crisis or storm: Secondary | ICD-10-CM | POA: Diagnosis not present

## 2020-06-09 DIAGNOSIS — Z794 Long term (current) use of insulin: Secondary | ICD-10-CM | POA: Diagnosis not present

## 2020-06-16 ENCOUNTER — Ambulatory Visit: Payer: Medicare Other | Admitting: Podiatry

## 2020-06-30 ENCOUNTER — Encounter: Payer: Self-pay | Admitting: Podiatry

## 2020-06-30 ENCOUNTER — Ambulatory Visit: Payer: Medicare Other | Admitting: Podiatry

## 2020-06-30 ENCOUNTER — Other Ambulatory Visit: Payer: Self-pay

## 2020-06-30 DIAGNOSIS — B351 Tinea unguium: Secondary | ICD-10-CM

## 2020-06-30 DIAGNOSIS — M79676 Pain in unspecified toe(s): Secondary | ICD-10-CM | POA: Diagnosis not present

## 2020-06-30 DIAGNOSIS — E1142 Type 2 diabetes mellitus with diabetic polyneuropathy: Secondary | ICD-10-CM | POA: Diagnosis not present

## 2020-06-30 DIAGNOSIS — R0989 Other specified symptoms and signs involving the circulatory and respiratory systems: Secondary | ICD-10-CM | POA: Diagnosis not present

## 2020-06-30 NOTE — Progress Notes (Signed)
  Subjective:  Patient ID: Joseph Luna, male    DOB: Mar 28, 1951,  MRN: [id number] HPI Chief Complaint  Patient presents with  . Diabetes    Foot Exam and nail care - patient requesting nail trim, PCP doesn't want him trimming himself, also small callus sub 1st met left, last a1c was 6.1  . New Patient (Initial Visit)    70 y.o. male presents with the above complaint.   ROS: Denies fever chills nausea vomiting muscle aches pains calf pain back pain chest pain shortness of breath.  Past Medical History:  Diagnosis Date  . Arthritis   . Diabetes mellitus without complication ([place])   . Hyperlipidemia   . Thyroid disease    Past Surgical History:  Procedure Laterality Date  . APPENDECTOMY    . none      Current Outpatient Medications:  .  acetaminophen (TYLENOL) 500 MG tablet, Take 500 mg by mouth every 6 (six) hours as needed., Disp: , Rfl:  .  aspirin EC 81 MG EC tablet, Take 1 tablet (81 mg total) by mouth daily., Disp: 30 tablet, Rfl: 0 .  atorvastatin (LIPITOR) 10 MG tablet, Take 1 tablet by mouth once daily, Disp: 90 tablet, Rfl: 0 .  gabapentin (NEURONTIN) 300 MG capsule, TAKE 1 CAPSULE BY MOUTH THREE TIMES DAILY, Disp: 90 capsule, Rfl: 0 .  Insulin Isophane & Regular Human (NOVOLIN 70/30 FLEXPEN RELION) (70-30) 100 UNIT/ML PEN, Inject 21 Units into the skin 2 (two) times daily with a meal., Disp: 15 mL, Rfl: 0 .  methimazole (TAPAZOLE) 10 MG tablet, Take 10 mg by mouth daily. Two tablets daily on M,W,F and 1 tablets all other days., Disp: , Rfl:  .  metoprolol tartrate (LOPRESSOR) 25 MG tablet, Take 0.5 tablets (12.5 mg total) by mouth 2 (two) times daily., Disp: 180 tablet, Rfl: 3  Allergies  Allergen Reactions  . Heparin   . Penicillin G Itching   Review of Systems Objective:  There were no vitals filed for this visit.  General: Well developed, nourished, in no acute distress, alert and oriented x3   Dermatological: Skin is warm, dry and supple bilateral. Nails x  10 are well maintained; remaining integument appears unremarkable at this time. There are no open sores, no preulcerative lesions, no rash or signs of infection present.  Reactive hyperkeratotic lesion subfifth met head left no open lesions or wounds are noted.  Vascular: Dorsalis Pedis artery and Posterior Tibial artery pedal pulses are 1/4 bilateral with delayed capillary fill time. Pedal hair growth present. No varicosities and no lower extremity edema present bilateral.   Neruologic: Grossly intact via light touch bilateral. Vibratory intact via tuning fork bilateral. Protective threshold with Semmes Wienstein monofilament diminished to all pedal sites bilateral. Patellar and Achilles deep tendon reflexes 2+ bilateral. No Babinski or clonus noted bilateral.   Musculoskeletal: No gross boney pedal deformities bilateral. No pain, crepitus, or limitation noted with foot and ankle range of motion bilateral. Muscular strength 5/5 in all groups tested bilateral.  Gait: Unassisted, Nonantalgic.    Radiographs:  None taken  Assessment & Plan:   Assessment: Painful elongated toenails diabetic peripheral neuropathy and peripheral vascular disease.  Plan: Debrided nails and reactive hyperkeratoses today.  I also discussed sending him for ABIs to [facility] otherwise I will follow-up with him in 6 months     Nalee Lightle T. [place], [place]

## 2020-07-12 ENCOUNTER — Other Ambulatory Visit: Payer: Self-pay | Admitting: Podiatry

## 2020-07-12 DIAGNOSIS — R0989 Other specified symptoms and signs involving the circulatory and respiratory systems: Secondary | ICD-10-CM

## 2020-07-12 DIAGNOSIS — E1142 Type 2 diabetes mellitus with diabetic polyneuropathy: Secondary | ICD-10-CM

## 2020-10-29 ENCOUNTER — Other Ambulatory Visit: Payer: Self-pay

## 2020-10-29 DIAGNOSIS — E1111 Type 2 diabetes mellitus with ketoacidosis with coma: Secondary | ICD-10-CM

## 2020-10-29 MED ORDER — ATORVASTATIN CALCIUM 10 MG PO TABS: 10.0000 mg | ORAL_TABLET | Freq: Every day | ORAL | 1 refills | Status: DC

## 2020-10-29 NOTE — Telephone Encounter (Signed)
Looks like prescription was already sent to local pharmacy today by Maurine Minister. Please check with patient if he intended to get this [facility]....its not on his pharmacy list.

## 2020-10-29 NOTE — Addendum Note (Signed)
Addended by: Malva Limes on: 10/29/2020 05:50 PM   Modules accepted: Orders

## 2020-10-29 NOTE — Telephone Encounter (Signed)
[facility] faxed refill request for the following medications:  atorvastatin (LIPITOR) 10 MG tablet   Please advise.

## 2020-11-02 IMAGING — CT CT HEAD W/O CM
3 series · 15 of 47 positions shown, 18 images · non-contrast
Comparison: None.

CLINICAL DATA: Found unresponsive this morning.

EXAM:
CT HEAD WITHOUT CONTRAST
TECHNIQUE: Contiguous axial images were obtained from the base of the skull
through the vertex without intravenous contrast.

[Series 2: head wo · axial · 0.42mm/px · z∈[-161,-36]mm · 9 of 31 slices shown, 12 images]
[im 3/31  brain]
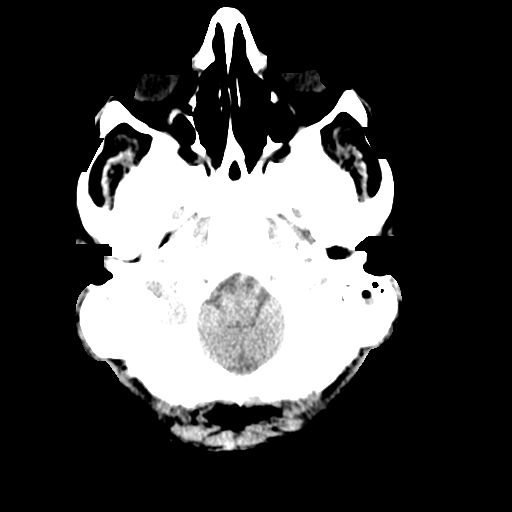
[im 3/31  bone]
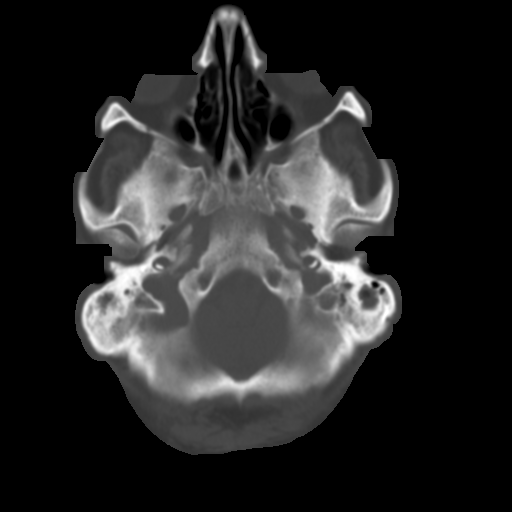
[im 6/31  brain]
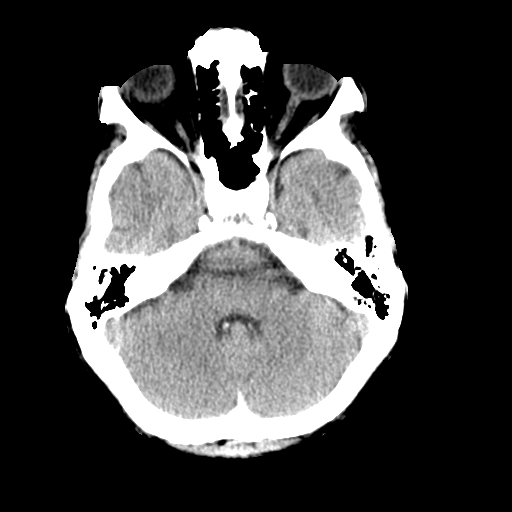
[im 9/31  brain]
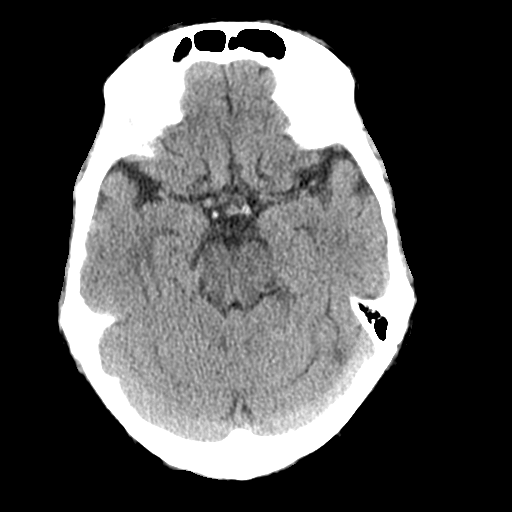
[im 12/31  brain]
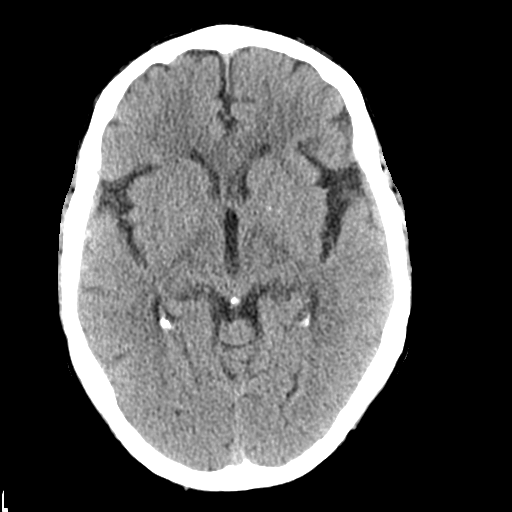
[im 16/31  brain]
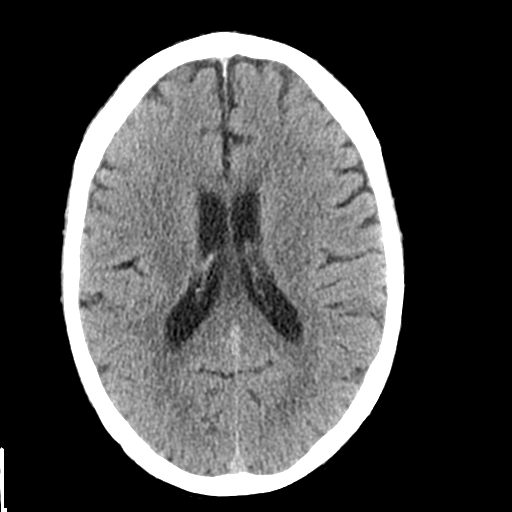
[im 16/31  bone]
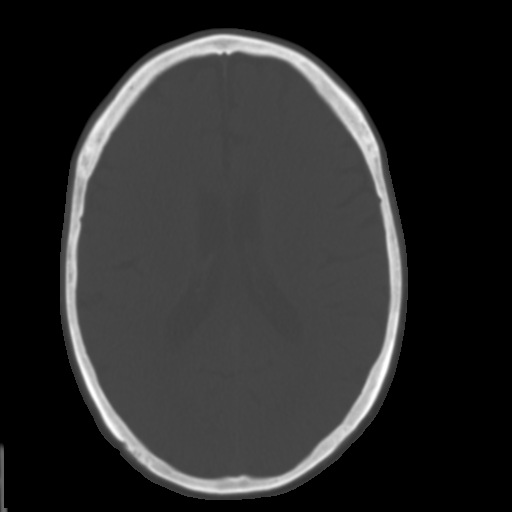
[im 19/31  brain]
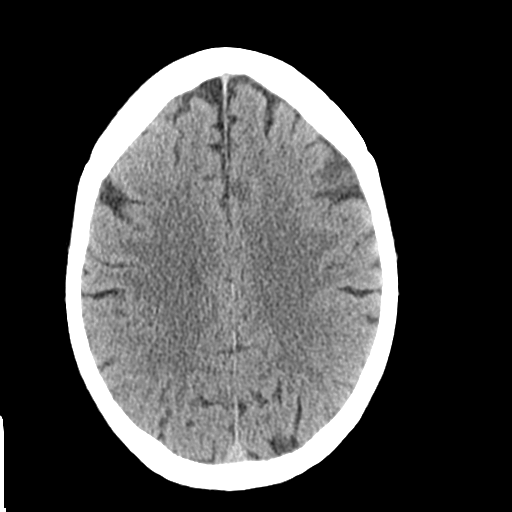
[im 22/31  brain]
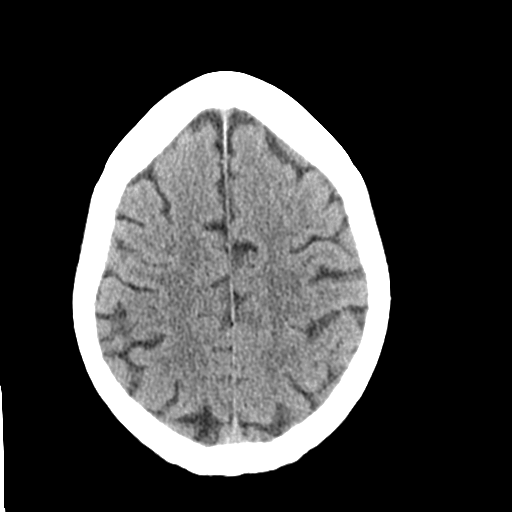
[im 25/31  brain]
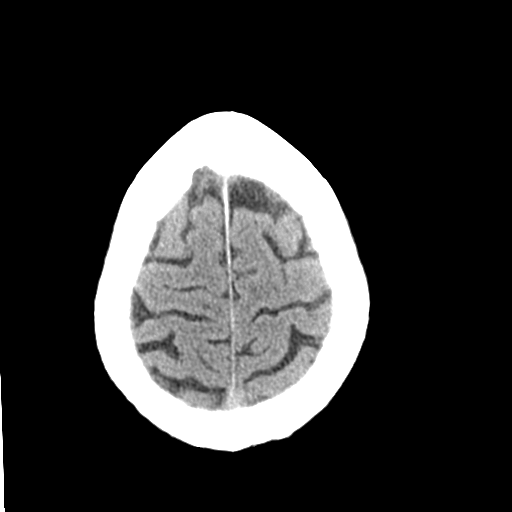
[im 28/31  brain]
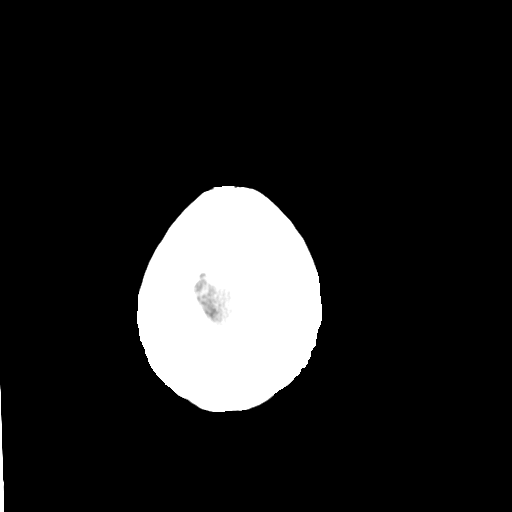
[im 28/31  bone]
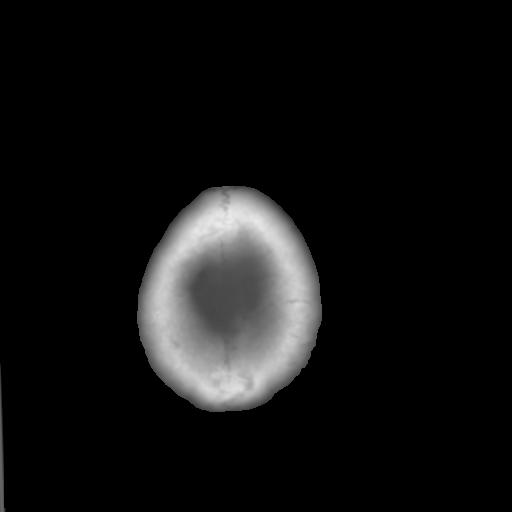

[Series 4: coronal soft tissue · coronal · 0.30mm/px · 3 of 67 slices shown]
[im 23/67  brain]
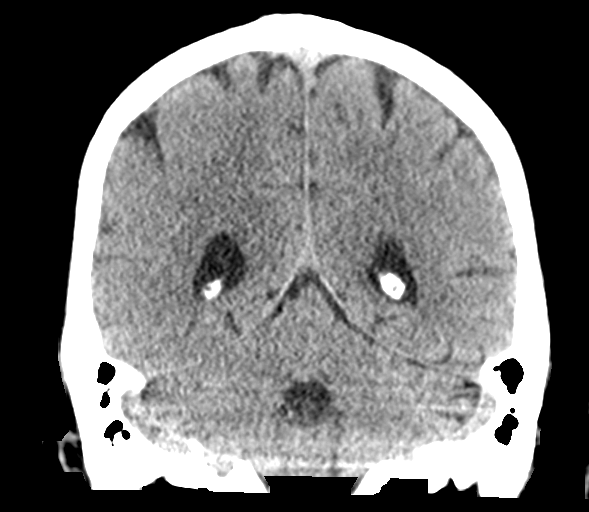
[im 30/67  brain]
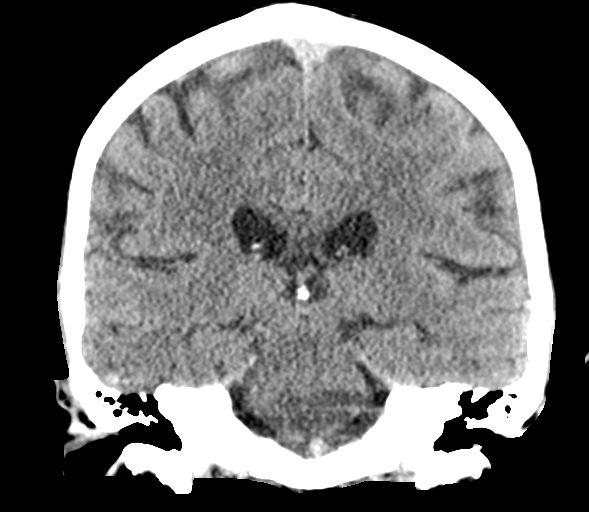
[im 37/67  brain]
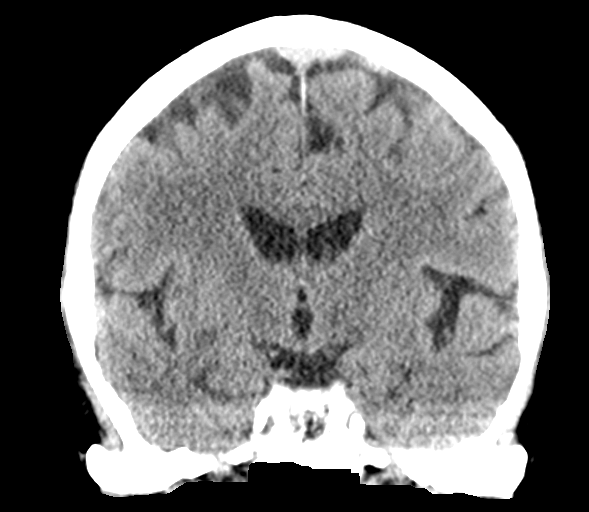

[Series 5: sagittal soft tissue · sagittal · 0.30mm/px · 3 of 50 slices shown]
[im 17/50  brain]
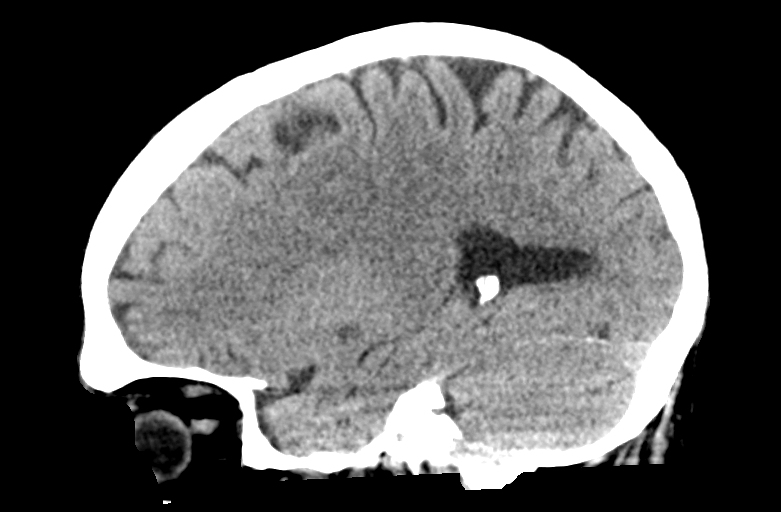
[im 25/50  brain]
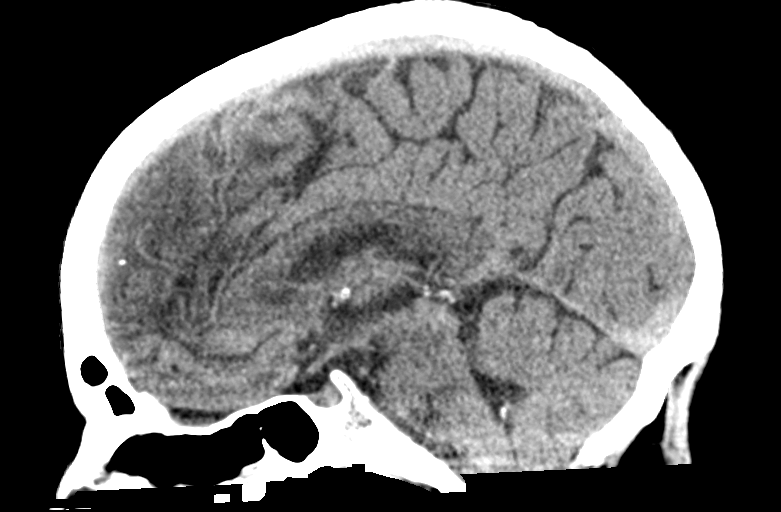
[im 33/50  brain]
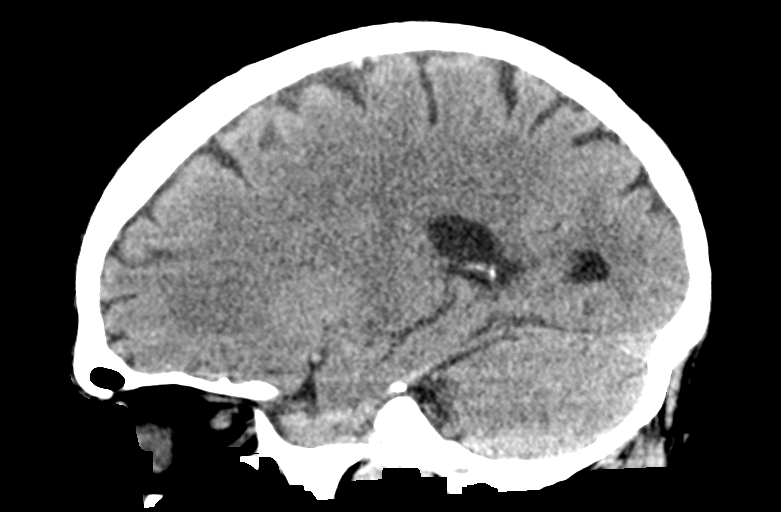

[15 of 47 positions shown; findings below may reference images not displayed]

FINDINGS: Brain: Mild chronic small vessel ischemic changes within the deep
periventricular white matter. No mass, hemorrhage, edema or other
evidence of acute parenchymal abnormality. No extra-axial
hemorrhage.

Vascular: Chronic calcified atherosclerotic changes of the large
vessels at the skull base. No unexpected hyperdense vessel.

Skull: Normal. Negative for fracture or focal lesion.

Sinuses/Orbits: No acute finding.

Other: None.
IMPRESSION: No acute findings. No intracranial mass, hemorrhage or edema.

## 2020-11-03 ENCOUNTER — Telehealth: Payer: Self-pay | Admitting: Physician Assistant

## 2020-11-03 DIAGNOSIS — E1111 Type 2 diabetes mellitus with ketoacidosis with coma: Secondary | ICD-10-CM

## 2020-11-03 MED ORDER — ATORVASTATIN CALCIUM 10 MG PO TABS: 10.0000 mg | ORAL_TABLET | Freq: Every day | ORAL | 0 refills | Status: DC

## 2020-11-03 NOTE — Telephone Encounter (Signed)
[facility] faxed refill request for the following medications:   LIPITOR  TAB 20MG   MEDICATION IS NOT ON CURRENT MED LIST   Please advise.  

## 2020-12-06 ENCOUNTER — Ambulatory Visit (INDEPENDENT_AMBULATORY_CARE_PROVIDER_SITE_OTHER): Payer: Medicare Other | Admitting: Family Medicine

## 2020-12-06 ENCOUNTER — Other Ambulatory Visit: Payer: Self-pay

## 2020-12-06 ENCOUNTER — Encounter: Payer: Self-pay | Admitting: Family Medicine

## 2020-12-06 ENCOUNTER — Ambulatory Visit: Payer: Self-pay | Admitting: Physician Assistant

## 2020-12-06 VITALS — BP 118/50 | HR 80 | Wt 222.0 lb

## 2020-12-06 DIAGNOSIS — E1169 Type 2 diabetes mellitus with other specified complication: Secondary | ICD-10-CM | POA: Diagnosis not present

## 2020-12-06 DIAGNOSIS — Z794 Long term (current) use of insulin: Secondary | ICD-10-CM

## 2020-12-06 DIAGNOSIS — E1140 Type 2 diabetes mellitus with diabetic neuropathy, unspecified: Secondary | ICD-10-CM | POA: Diagnosis not present

## 2020-12-06 DIAGNOSIS — E785 Hyperlipidemia, unspecified: Secondary | ICD-10-CM | POA: Diagnosis not present

## 2020-12-06 DIAGNOSIS — E0590 Thyrotoxicosis, unspecified without thyrotoxic crisis or storm: Secondary | ICD-10-CM | POA: Diagnosis not present

## 2020-12-06 MED ORDER — NOVOLIN 70/30 FLEXPEN RELION (70-30) 100 UNIT/ML ~~LOC~~ SUPN: 27.0000 [IU] | PEN_INJECTOR | Freq: Two times a day (BID) | SUBCUTANEOUS | 0 refills | Status: AC

## 2020-12-06 NOTE — Progress Notes (Signed)
Established patient visit   Patient: Joseph Luna   DOB: 1951-01-28   70 y.o. Male  MRN: [id number] Visit Date: 12/06/2020  Today's healthcare provider: Dortha Kern, PA-C   Chief Complaint  Patient presents with   Hyperlipidemia    Subjective  -------------------------------------------------------------------------------------------------------------------- HPI  Lipid/Cholesterol, Follow-up  Last lipid panel Other pertinent labs  Lab Results  Component Value Date   CHOL 135 06/07/2020   HDL 28 (L) 06/07/2020   LDLCALC 49 06/07/2020   TRIG 390 (H) 06/07/2020   CHOLHDL 4.8 06/07/2020   Lab Results  Component Value Date   ALT 17 06/07/2020   AST 18 06/07/2020   PLT 189 06/07/2020   TSH 0.969 06/07/2020     He was last seen for this 6 months ago.  Management since that visit includes no changes.  He reports excellent compliance with treatment. He is not having side effects.   Symptoms: No chest pain No chest pressure/discomfort  No dyspnea No lower extremity edema  No numbness or tingling of extremity No orthopnea  No palpitations No paroxysmal nocturnal dyspnea  No speech difficulty No syncope   Current diet: in general, a "healthy" diet   Current exercise: walking  The ASCVD Risk score Denman George DC Jr., et al., 2013) failed to calculate for the following reasons:   The patient has a prior MI or stroke diagnosis  ---------------------------------------------------------------------------------------------------  Follow up for Neuropathy  The patient was last seen for this 6 months ago. Changes made at last visit include none.  He reports excellent compliance with treatment. He feels that condition is Worse. He is not having side effects. Pt would like to increase gabapentin.   ----------------------------------------------------------------------------------------- Past Medical History:  Diagnosis Date   Arthritis    Diabetes mellitus without  complication (HCC)    Hyperlipidemia    Thyroid disease    Past Surgical History:  Procedure Laterality Date   APPENDECTOMY     none     Family History  Problem Relation Age of Onset   Cancer Mother    Heart failure Father    Cancer Sister    Social History   Tobacco Use   Smoking status: Every Day    Packs/day: 0.50    Years: 40.00    Pack years: 20.00    Types: Cigarettes   Smokeless tobacco: Never  Vaping Use   Vaping Use: Never used  Substance Use Topics   Alcohol use: Yes    Comment: 1-2 beers occaisonally   Drug use: Never   Allergies  Allergen Reactions   Heparin    Penicillin G Itching   Medications: Outpatient Medications Prior to Visit  Medication Sig   acetaminophen (TYLENOL) 500 MG tablet Take 500 mg by mouth every 6 (six) hours as needed.   aspirin EC 81 MG EC tablet Take 1 tablet (81 mg total) by mouth daily.   atorvastatin (LIPITOR) 10 MG tablet Take 1 tablet (10 mg total) by mouth daily.   gabapentin (NEURONTIN) 300 MG capsule TAKE 1 CAPSULE BY MOUTH THREE TIMES DAILY   Insulin Isophane & Regular Human (NOVOLIN 70/30 FLEXPEN RELION) (70-30) 100 UNIT/ML PEN Inject 21 Units into the skin 2 (two) times daily with a meal.   methimazole (TAPAZOLE) 10 MG tablet Take 10 mg by mouth daily. Two tablets daily on M,W,F and 1 tablets all other days.   metoprolol tartrate (LOPRESSOR) 25 MG tablet Take 0.5 tablets (12.5 mg total) by mouth 2 (two) times daily.  No facility-administered medications prior to visit.    Review of Systems  Constitutional: Negative.   Respiratory: Negative.    Cardiovascular: Negative.   Gastrointestinal: Negative.   Musculoskeletal:  Positive for myalgias.  Neurological:  Positive for numbness. Negative for dizziness, light-headedness and headaches.      Objective  -------------------------------------------------------------------------------------------------------------------- BP (!) 118/50 (BP Location: Right Arm, Patient  Position: Sitting, Cuff Size: Large)   Pulse 80   Wt 222 lb (100.7 kg)   SpO2 99%   BMI 29.29 kg/m     Physical Exam Constitutional:      General: He is not in acute distress.    Appearance: He is well-developed.  HENT:     Head: Normocephalic and atraumatic.     Right Ear: Hearing and tympanic membrane normal.     Left Ear: Hearing and tympanic membrane normal.     Nose: Nose normal.  Eyes:     General: Lids are normal. No scleral icterus.       Right eye: No discharge.        Left eye: No discharge.     Conjunctiva/sclera: Conjunctivae normal.  Cardiovascular:     Rate and Rhythm: Normal rate and regular rhythm.     Pulses: Normal pulses.     Heart sounds: Normal heart sounds.  Pulmonary:     Effort: Pulmonary effort is normal. No respiratory distress.     Breath sounds: Normal breath sounds.  Abdominal:     General: Bowel sounds are normal.     Palpations: Abdomen is soft.  Musculoskeletal:        General: Normal range of motion.  Skin:    Findings: No lesion or rash.  Neurological:     Mental Status: He is alert and oriented to person, place, and time.     Sensory: Sensory deficit present.     Comments: Denies sensation in the left thigh to foot.  Psychiatric:        Speech: Speech normal.        Behavior: Behavior normal.        Thought Content: Thought content normal.      No results found for any visits on 12/06/20.  Assessment & Plan  ---------------------------------------------------------------------------------------------------------------------- 1. Hyperlipidemia associated with type 2 diabetes mellitus (HCC) Tolerating Atorvastatin 10 mg qd without side effects. Continue work on low fat diet and recheck follow up labs. - Comprehensive metabolic panel - Lipid panel - TSH - T4  2. Type 2 diabetes mellitus with diabetic neuropathy, with long-term current use of insulin (HCC) Presently on Humulin 70/30 21 units BID and taking Gabapentin 300 mg TID.  Last follow up with podiatrist was on 06-30-20. No open sores on feet but no sensation in the left lower leg and foot. Recheck labs and continue follow up with Dr. Gershon Crane. - CBC with Differential/Platelet - Comprehensive metabolic panel - Lipid panel - Hemoglobin A1c  3. Hyperthyroidism Followed by Dr. Gershon Crane (endocrinologist) and taking Tapazole 10 mg two tablets MWF and 1 tablet the rest of the week. Will recheck labs and advised to continue recheck with endo. - CBC with Differential/Platelet - Comprehensive metabolic panel - TSH - T4   No follow-ups on file.      I, Maty Zeisler, PA-C, have reviewed all documentation for this visit. The documentation on 12/06/20 for the exam, diagnosis, procedures, and orders are all accurate and complete.    Dortha Kern, PA-C  [facility] [phone] (phone) [phone] (fax)  [facility]

## 2020-12-07 LAB — T4: T4, Total: 7.3 ug/dL (ref 4.5–12.0)

## 2020-12-07 LAB — CBC WITH DIFFERENTIAL/PLATELET
Basophils Absolute: 0.1 10*3/uL (ref 0.0–0.2)
Basos: 1 %
EOS (ABSOLUTE): 0.3 10*3/uL (ref 0.0–0.4)
Eos: 3 %
Hematocrit: 43 % (ref 37.5–51.0)
Hemoglobin: 14.3 g/dL (ref 13.0–17.7)
Immature Grans (Abs): 0 10*3/uL (ref 0.0–0.1)
Immature Granulocytes: 0 %
Lymphocytes Absolute: 2.1 10*3/uL (ref 0.7–3.1)
Lymphs: 22 %
MCH: 29.9 pg (ref 26.6–33.0)
MCHC: 33.3 g/dL (ref 31.5–35.7)
MCV: 90 fL (ref 79–97)
Monocytes Absolute: 0.7 10*3/uL (ref 0.1–0.9)
Monocytes: 8 %
Neutrophils Absolute: 6.5 10*3/uL (ref 1.4–7.0)
Neutrophils: 66 %
Platelets: 190 10*3/uL (ref 150–450)
RBC: 4.79 x10E6/uL (ref 4.14–5.80)
RDW: 14 % (ref 11.6–15.4)
WBC: 9.8 10*3/uL (ref 3.4–10.8)

## 2020-12-07 LAB — COMPREHENSIVE METABOLIC PANEL
ALT: 22 IU/L (ref 0–44)
AST: 22 IU/L (ref 0–40)
Albumin/Globulin Ratio: 2 (ref 1.2–2.2)
Albumin: 4.6 g/dL (ref 3.8–4.8)
Alkaline Phosphatase: 59 IU/L (ref 44–121)
BUN/Creatinine Ratio: 16 (ref 10–24)
BUN: 19 mg/dL (ref 8–27)
Bilirubin Total: 0.2 mg/dL (ref 0.0–1.2)
CO2: 22 mmol/L (ref 20–29)
Calcium: 9.7 mg/dL (ref 8.6–10.2)
Chloride: 105 mmol/L (ref 96–106)
Creatinine, Ser: 1.16 mg/dL (ref 0.76–1.27)
Globulin, Total: 2.3 g/dL (ref 1.5–4.5)
Glucose: 50 mg/dL — ABNORMAL LOW (ref 65–99)
Potassium: 4.8 mmol/L (ref 3.5–5.2)
Sodium: 140 mmol/L (ref 134–144)
Total Protein: 6.9 g/dL (ref 6.0–8.5)
eGFR: 68 mL/min/{1.73_m2} (ref >59)

## 2020-12-07 LAB — HEMOGLOBIN A1C
Est. average glucose Bld gHb Est-mCnc: 126 mg/dL
Hgb A1c MFr Bld: 6 % — ABNORMAL HIGH (ref 4.8–5.6)

## 2020-12-07 LAB — LIPID PANEL
Chol/HDL Ratio: 3.1 ratio (ref 0.0–5.0)
Cholesterol, Total: 114 mg/dL (ref 100–199)
HDL: 37 mg/dL — ABNORMAL LOW (ref >39)
LDL Chol Calc (NIH): 54 mg/dL (ref 0–99)
Triglycerides: 130 mg/dL (ref 0–149)
VLDL Cholesterol Cal: 23 mg/dL (ref 5–40)

## 2020-12-07 LAB — TSH: TSH: 2.25 u[IU]/mL (ref 0.450–4.500)

## 2021-01-07 ENCOUNTER — Other Ambulatory Visit: Payer: Self-pay

## 2021-01-07 DIAGNOSIS — G629 Polyneuropathy, unspecified: Secondary | ICD-10-CM

## 2021-01-07 MED ORDER — GABAPENTIN 300 MG PO CAPS
300.0000 mg | ORAL_CAPSULE | Freq: Three times a day (TID) | ORAL | 0 refills | Status: DC
Start: 2021-01-07 — End: 2021-01-11

## 2021-01-07 NOTE — Telephone Encounter (Signed)
[facility] faxed refill request for the following medications:  gabapentin (NEURONTIN) 300 MG capsule   Please advise.

## 2021-01-11 MED ORDER — GABAPENTIN 300 MG PO CAPS: 300.0000 mg | ORAL_CAPSULE | Freq: Three times a day (TID) | ORAL | 0 refills | Status: DC

## 2021-01-11 NOTE — Telephone Encounter (Signed)
This was sent to [facility] and was supposed to be sent to [facility].  We received another request from [facility].  Can this be sent to the correct pharmacy.  Please advise

## 2021-01-11 NOTE — Telephone Encounter (Addendum)
Looks like diabetes is followed by Endocrinology. Please advise if ok to re-send Gabapentin refill to mail order pharmacy. FYI:  Patient was seen by Endocrinology on 12/08/2020. The medication dosage and instructions are different in the Endocrinology note from what is listed in our records.

## 2021-01-11 NOTE — Addendum Note (Signed)
Addended by: Benjiman Core on: 01/11/2021 11:24 AM   Modules accepted: Orders

## 2021-01-12 ENCOUNTER — Ambulatory Visit: Payer: Self-pay | Admitting: Podiatry

## 2021-01-20 ENCOUNTER — Other Ambulatory Visit: Payer: Self-pay | Admitting: Family Medicine

## 2021-01-20 DIAGNOSIS — E1111 Type 2 diabetes mellitus with ketoacidosis with coma: Secondary | ICD-10-CM

## 2021-01-20 NOTE — Telephone Encounter (Signed)
Requested Prescriptions  Pending Prescriptions Disp Refills  . atorvastatin (LIPITOR) 10 MG tablet [Pharmacy Med Name: Atorvastatin Calcium 10 MG Oral Tablet] 90 tablet 3    Sig: TAKE 1 TABLET BY MOUTH  DAILY     Cardiovascular:  Antilipid - Statins Failed - 01/20/2021 10:39 AM      Failed - HDL in normal range and within 360 days    HDL  Date Value Ref Range Status  12/06/2020 37 (L) >39 mg/dL Final         Passed - Total Cholesterol in normal range and within 360 days    Cholesterol, Total  Date Value Ref Range Status  12/06/2020 114 100 - 199 mg/dL Final         Passed - LDL in normal range and within 360 days    LDL Chol Calc (NIH)  Date Value Ref Range Status  12/06/2020 54 0 - 99 mg/dL Final         Passed - Triglycerides in normal range and within 360 days    Triglycerides  Date Value Ref Range Status  12/06/2020 130 0 - 149 mg/dL Final         Passed - Patient is not pregnant      Passed - Valid encounter within last 12 months    Recent Outpatient Visits          1 month ago Hyperlipidemia associated with type 2 diabetes mellitus (HCC)   [employer], Jodell Cipro, PA-C   7 months ago Hyperlipidemia associated with type 2 diabetes mellitus [facility])   [facility] [place], Lavella Hammock, PA-C   1 year ago Annual physical exam   [employer], [name] M, PA-C   1 year ago Type 2 diabetes mellitus with ketoacidotic coma, unspecified whether long term insulin use [facility])   [facility] [place], [place], [place]   2 years ago Type 2 diabetes mellitus with ketoacidotic coma, unspecified whether long term insulin use [facility])   [facility] [place], [place], [place]

## 2021-01-31 ENCOUNTER — Other Ambulatory Visit: Payer: Self-pay | Admitting: Family Medicine

## 2021-01-31 DIAGNOSIS — G629 Polyneuropathy, unspecified: Secondary | ICD-10-CM

## 2021-02-15 LAB — HM HEPATITIS C SCREENING LAB: HM Hepatitis Screen: NEGATIVE

## 2021-03-01 ENCOUNTER — Encounter: Payer: Self-pay | Admitting: Family Medicine

## 2021-04-29 ENCOUNTER — Encounter: Payer: Medicare Other | Admitting: Physician Assistant

## 2021-04-29 ENCOUNTER — Ambulatory Visit: Payer: Medicare Other | Admitting: Family Medicine

## 2021-05-13 ENCOUNTER — Other Ambulatory Visit: Payer: Self-pay

## 2021-05-13 ENCOUNTER — Ambulatory Visit (INDEPENDENT_AMBULATORY_CARE_PROVIDER_SITE_OTHER): Payer: Medicare Other | Admitting: Family Medicine

## 2021-05-13 ENCOUNTER — Encounter: Payer: Self-pay | Admitting: Family Medicine

## 2021-05-13 ENCOUNTER — Other Ambulatory Visit: Payer: Self-pay | Admitting: Family Medicine

## 2021-05-13 VITALS — BP 131/63 | HR 84 | Resp 18 | Wt 231.1 lb

## 2021-05-13 DIAGNOSIS — E0590 Thyrotoxicosis, unspecified without thyrotoxic crisis or storm: Secondary | ICD-10-CM | POA: Diagnosis not present

## 2021-05-13 DIAGNOSIS — E1140 Type 2 diabetes mellitus with diabetic neuropathy, unspecified: Secondary | ICD-10-CM | POA: Diagnosis not present

## 2021-05-13 DIAGNOSIS — E1169 Type 2 diabetes mellitus with other specified complication: Secondary | ICD-10-CM

## 2021-05-13 DIAGNOSIS — Z794 Long term (current) use of insulin: Secondary | ICD-10-CM

## 2021-05-13 DIAGNOSIS — Z23 Encounter for immunization: Secondary | ICD-10-CM | POA: Diagnosis not present

## 2021-05-13 DIAGNOSIS — E785 Hyperlipidemia, unspecified: Secondary | ICD-10-CM

## 2021-05-13 LAB — POCT GLYCOSYLATED HEMOGLOBIN (HGB A1C): Hemoglobin A1C: 5.5 % (ref 4.0–5.6)

## 2021-05-13 MED ORDER — GABAPENTIN 600 MG PO TABS: 600.0000 mg | ORAL_TABLET | Freq: Three times a day (TID) | ORAL | 3 refills | Status: DC

## 2021-05-13 NOTE — Progress Notes (Signed)
Established patient visit   Patient: Joseph Luna   DOB: May 10, 1950   71 y.o. Male  MRN: [id number] Visit Date: 05/13/2021  Today's healthcare provider: Gwyneth Sprout, FNP   Chief Complaint  Patient presents with   Diabetes   Hyperlipidemia   Hypothyroidism   Subjective    HPI  Diabetes Mellitus Type II, Follow-up  Lab Results  Component Value Date   HGBA1C 5.5 05/13/2021   HGBA1C 6.0 (H) 12/06/2020   HGBA1C 6.1 (H) 06/07/2020   Wt Readings from Last 3 Encounters:  05/13/21 231 lb 1.6 oz (104.8 kg)  12/06/20 222 lb (100.7 kg)  06/07/20 218 lb (98.9 kg)   Last seen for diabetes 5 months ago.  Management since then includes none. He reports excellent compliance with treatment. He is not having side effects.  Symptoms: Yes fatigue No foot ulcerations  No appetite changes No nausea  No paresthesia of the feet  No polydipsia  Yes polyuria No visual disturbances   No vomiting     Home blood sugar records:  on average 140  Episodes of hypoglycemia? No    Current insulin regiment: Presently on Humulin 70/30 21 units BID  Most Recent Eye Exam: DUE Current exercise: walking Current diet habits: well balanced  Pertinent Labs: Lab Results  Component Value Date   CHOL 114 12/06/2020   HDL 37 (L) 12/06/2020   LDLCALC 54 12/06/2020   TRIG 130 12/06/2020   CHOLHDL 3.1 12/06/2020   Lab Results  Component Value Date   NA 140 12/06/2020   K 4.8 12/06/2020   CREATININE 1.16 12/06/2020   EGFR 68 12/06/2020   LABMICR 4.7 06/07/2020     ---------------------------------------------------------------------------------------------------  Lipid/Cholesterol, Follow-up  Last lipid panel Other pertinent labs  Lab Results  Component Value Date   CHOL 114 12/06/2020   HDL 37 (L) 12/06/2020   LDLCALC 54 12/06/2020   TRIG 130 12/06/2020   CHOLHDL 3.1 12/06/2020   Lab Results  Component Value Date   ALT 22 12/06/2020   AST 22 12/06/2020   PLT 190 12/06/2020    TSH 2.250 12/06/2020     He was last seen for this 5 months ago.  Management since that visit includes continue to work on low fat diet and exercise.  He reports good compliance with treatment. He is not having side effects.   Symptoms: No chest pain No chest pressure/discomfort  No dyspnea No lower extremity edema  No numbness or tingling of extremity No orthopnea  No palpitations No paroxysmal nocturnal dyspnea  No speech difficulty No syncope   Current diet: well balanced Current exercise: walking  The ASCVD Risk score (Arnett DK, et al., 2019) failed to calculate for the following reasons:   The patient has a prior MI or stroke diagnosis  ---------------------------------------------------------------------------------------------------  Hypothyroid, follow-up  Lab Results  Component Value Date   TSH 2.250 12/06/2020   TSH 0.969 06/07/2020   TSH 1.420 07/30/2018   FREET4 1.13 07/30/2018   T4TOTAL 7.3 12/06/2020   T4TOTAL 4.9 06/13/2018    Wt Readings from Last 3 Encounters:  05/13/21 231 lb 1.6 oz (104.8 kg)  12/06/20 222 lb (100.7 kg)  06/07/20 218 lb (98.9 kg)    He was last seen for hypothyroid 5 months ago.  Management since that visit includes Followed by Dr. Honor Junes (endocrinologist) and taking Tapazole 10 mg two tablets MWF and 1 tablet the rest of the week.[name] He reports excellent compliance with treatment. He is  not having side effects.   Symptoms: No change in energy level No constipation  No diarrhea No heat / cold intolerance  No nervousness No palpitations  No weight changes    -----------------------------------------------------------------------------------------   Medications: Outpatient Medications Prior to Visit  Medication Sig   acetaminophen (TYLENOL) 500 MG tablet Take 500 mg by mouth every 6 (six) hours as needed.   aspirin EC 81 MG EC tablet Take 1 tablet (81 mg total) by mouth daily.   atorvastatin (LIPITOR) 10 MG tablet TAKE 1  TABLET BY MOUTH  DAILY   insulin isophane & regular human KwikPen (NOVOLIN 70/30 KWIKPEN) (70-30) 100 UNIT/ML KwikPen Inject 27 Units into the skin 2 (two) times daily with a meal.   methimazole (TAPAZOLE) 10 MG tablet Take 10 mg by mouth daily. Two tablets daily on M,W,F and 1 tablets all other days.   [DISCONTINUED] gabapentin (NEURONTIN) 300 MG capsule TAKE 1 CAPSULE BY MOUTH 3  TIMES DAILY   [DISCONTINUED] metoprolol tartrate (LOPRESSOR) 25 MG tablet Take 0.5 tablets (12.5 mg total) by mouth 2 (two) times daily.   No facility-administered medications prior to visit.    Review of Systems     Objective    BP 131/63    Pulse 84    Resp 18    Wt 231 lb 1.6 oz (104.8 kg)    SpO2 99%    BMI 30.49 kg/m    Physical Exam Vitals and nursing note reviewed.  Constitutional:      Appearance: Normal appearance. He is overweight.  HENT:     Head: Normocephalic and atraumatic.  Eyes:     Pupils: Pupils are equal, round, and reactive to light.  Cardiovascular:     Rate and Rhythm: Normal rate and regular rhythm.     Pulses: Normal pulses.     Heart sounds: Normal heart sounds.  Pulmonary:     Effort: Pulmonary effort is normal.     Breath sounds: Normal breath sounds.  Musculoskeletal:        General: Normal range of motion.     Cervical back: Normal range of motion.  Skin:    General: Skin is warm and dry.     Capillary Refill: Capillary refill takes less than 2 seconds.  Neurological:     General: No focal deficit present.     Mental Status: He is alert and oriented to person, place, and time. Mental status is at baseline.     Results for orders placed or performed in visit on 05/13/21  POCT glycosylated hemoglobin (Hb A1C)  Result Value Ref Range   Hemoglobin A1C 5.5 4.0 - 5.6 %   HbA1c POC (<> result, manual entry)     HbA1c, POC (prediabetic range)     HbA1c, POC (controlled diabetic range)      Assessment & Plan     Problem List Items Addressed This Visit        Endocrine   Type 2 diabetes mellitus with diabetic neuropathy ([place]) - Primary    A1c stable Referral to podiatry for nail cut Continue to recommend balanced, lower carb meals. Smaller meal size, adding snacks. Choosing water as drink of choice and increasing purposeful exercise.       Relevant Medications   gabapentin (NEURONTIN) 600 MG tablet   Other Relevant Orders   POCT glycosylated hemoglobin (Hb A1C) (Completed)   Comprehensive metabolic panel   Ambulatory referral to Optometry   Ambulatory referral to Podiatry   Hyperthyroidism  Stable on current dosing F/b endocrine      Relevant Orders   TSH + free T4   Hyperlipidemia associated with type 2 diabetes mellitus ([place])    Repeat lipid panel; goal of LDL <70      Relevant Orders   Lipid panel     Other   Need for influenza vaccination    provided      Relevant Orders   Flu Vaccine QUAD High Dose(Fluad) (Completed)   Need for tetanus booster    provided      Relevant Orders   Tdap vaccine greater than or equal to 7yo IM (Completed)     Return in about 3 months (around 08/11/2021) for annual examination- Medicare wellness and CPE.      Vonna Kotyk, FNP, have reviewed all documentation for this visit. The documentation on 05/13/21 for the exam, diagnosis, procedures, and orders are all accurate and complete.  Patient seen and examined by Tally Joe,  FNP note scribed by [employer] Books, [place], [place] [phone] (phone) [phone] (fax)  [place]

## 2021-05-13 NOTE — Assessment & Plan Note (Signed)
provided ?

## 2021-05-13 NOTE — Assessment & Plan Note (Signed)
Stable on current dosing F/b endocrine

## 2021-05-13 NOTE — Assessment & Plan Note (Signed)
A1c stable Referral to podiatry for nail cut Continue to recommend balanced, lower carb meals. Smaller meal size, adding snacks. Choosing water as drink of choice and increasing purposeful exercise.

## 2021-05-13 NOTE — Assessment & Plan Note (Signed)
Repeat lipid panel; goal of LDL <70

## 2021-05-14 LAB — LIPID PANEL
Chol/HDL Ratio: 4.6 ratio (ref 0.0–5.0)
Cholesterol, Total: 138 mg/dL (ref 100–199)
HDL: 30 mg/dL — ABNORMAL LOW (ref >39)
LDL Chol Calc (NIH): 49 mg/dL (ref 0–99)
Triglycerides: 397 mg/dL — ABNORMAL HIGH (ref 0–149)
VLDL Cholesterol Cal: 59 mg/dL — ABNORMAL HIGH (ref 5–40)

## 2021-05-14 LAB — COMPREHENSIVE METABOLIC PANEL
ALT: 17 IU/L (ref 0–44)
AST: 17 IU/L (ref 0–40)
Albumin/Globulin Ratio: 2 (ref 1.2–2.2)
Albumin: 4.7 g/dL (ref 3.8–4.8)
Alkaline Phosphatase: 49 IU/L (ref 44–121)
BUN/Creatinine Ratio: 20 (ref 10–24)
BUN: 23 mg/dL (ref 8–27)
Bilirubin Total: <0.2 mg/dL (ref 0.0–1.2)
CO2: 20 mmol/L (ref 20–29)
Calcium: 9.5 mg/dL (ref 8.6–10.2)
Chloride: 105 mmol/L (ref 96–106)
Creatinine, Ser: 1.15 mg/dL (ref 0.76–1.27)
Globulin, Total: 2.3 g/dL (ref 1.5–4.5)
Glucose: 91 mg/dL (ref 70–99)
Potassium: 4.6 mmol/L (ref 3.5–5.2)
Sodium: 140 mmol/L (ref 134–144)
Total Protein: 7 g/dL (ref 6.0–8.5)
eGFR: 69 mL/min/{1.73_m2} (ref >59)

## 2021-05-14 LAB — TSH+FREE T4
Free T4: 1.18 ng/dL (ref 0.82–1.77)
TSH: 1.81 u[IU]/mL (ref 0.450–4.500)

## 2021-05-16 NOTE — Progress Notes (Signed)
Cholesterol panel shows -elevated triglycerides -low HDL/good cholesterol  Recommend increase in diet of healthier fat choices- low fat meats, oils that are not solid at room temperature, nuts, seeds, fish- cod, halibut, salmon, and avocado. Exercise can also increase this number.  Supplemental omega 3's can be taken as well but are not as helpful as dietary/exercise changes.  Thyroid labs are stable/normal.  Chemistry is normal/stable.  It was a pleasure to see meet in the office the other day.  Please let us know if you have any questions.  Thank you,  Merita Norton, FNP

## 2021-06-02 ENCOUNTER — Ambulatory Visit (INDEPENDENT_AMBULATORY_CARE_PROVIDER_SITE_OTHER): Payer: Medicare Other | Admitting: Podiatry

## 2021-06-02 ENCOUNTER — Other Ambulatory Visit: Payer: Self-pay

## 2021-06-02 ENCOUNTER — Encounter: Payer: Self-pay | Admitting: Podiatry

## 2021-06-02 DIAGNOSIS — M79676 Pain in unspecified toe(s): Secondary | ICD-10-CM | POA: Diagnosis not present

## 2021-06-02 DIAGNOSIS — E1142 Type 2 diabetes mellitus with diabetic polyneuropathy: Secondary | ICD-10-CM

## 2021-06-02 DIAGNOSIS — B351 Tinea unguium: Secondary | ICD-10-CM | POA: Diagnosis not present

## 2021-06-02 DIAGNOSIS — R0989 Other specified symptoms and signs involving the circulatory and respiratory systems: Secondary | ICD-10-CM

## 2021-06-02 NOTE — Progress Notes (Signed)
This patient returns to my office for at risk foot care.  This patient requires this care by a professional since this patient will be at risk due to having diabetes and neuropathy.  This patient is unable to cut nails himself since the patient cannot reach his nails.These nails are painful walking and wearing shoes.  This patient presents for at risk foot care today.  General Appearance  Alert, conversant and in no acute stress.  Vascular  Dorsalis pedis and posterior tibial  pulses are palpable  right.  Diminished dorsalis pedis and posterior tibial pulses left foot.  Capillary return is within normal limits  bilaterally. Temperature is within normal limits  bilaterally.  Neurologic  Senn-Weinstein monofilament wire test diminished  bilaterally. Muscle power within normal limits bilaterally.  Nails Thick disfigured discolored nails with subungual debris  from hallux to fifth toes bilaterally. No evidence of bacterial infection or drainage bilaterally.  Orthopedic  No limitations of motion  feet .  No crepitus or effusions noted.  No bony pathology or digital deformities noted.  Skin  normotropic skin with no porokeratosis noted bilaterally.  No signs of infections or ulcers noted.     Onychomycosis  Pain in right toes  Pain in left toes  Consent was obtained for treatment procedures.   Mechanical debridement of nails 1-5  bilaterally performed with a nail nipper.  Filed with dremel without incident.    Return office visit    prn                 Told patient to return for periodic foot care and evaluation due to potential at risk complications.   Helane Gunther DPM

## 2021-06-16 LAB — HM DIABETES EYE EXAM

## 2021-07-07 ENCOUNTER — Other Ambulatory Visit: Payer: Self-pay | Admitting: Family Medicine

## 2021-07-07 DIAGNOSIS — E1140 Type 2 diabetes mellitus with diabetic neuropathy, unspecified: Secondary | ICD-10-CM

## 2021-07-11 NOTE — Telephone Encounter (Signed)
Requested medications are due for refill today.  yes ? ?Requested medications are on the active medications list.  yes ? ?Last refill. 05/13/2021 #90 3 refills ? ?Future visit scheduled.   yes ? ?Notes to clinic.  Pharmacy requesting 1 year supply. ? ? ? ?Requested Prescriptions  ?Pending Prescriptions Disp Refills  ? gabapentin (NEURONTIN) 600 MG tablet [Pharmacy Med Name: Gabapentin 600 MG Oral Tablet] 90 tablet 2  ?  Sig: TAKE 1 TABLET BY MOUTH 3 TIMES  DAILY  ?  ? Neurology: Anticonvulsants - gabapentin Passed - 07/07/2021 11:15 PM  ?  ?  Passed - Cr in normal range and within 360 days  ?  Creatinine  ?Date Value Ref Range Status  ?05/04/2012 0.97 0.60 - 1.30 mg/dL Final  ? ?Creatinine, Ser  ?Date Value Ref Range Status  ?05/13/2021 1.15 0.76 - 1.27 mg/dL Final  ?  ?  ?  ?  Passed - Completed PHQ-2 or PHQ-9 in the last 360 days  ?  ?  Passed - Valid encounter within last 12 months  ?  Recent Outpatient Visits   ? ?      ? 1 month ago Type 2 diabetes mellitus with diabetic neuropathy, with long-term current use of insulin ([place])  ? [facility] Gwyneth Sprout, FNP  ? 7 months ago Hyperlipidemia associated with type 2 diabetes mellitus ([place])  ? [place], PA-C  ? 1 year ago Hyperlipidemia associated with type 2 diabetes mellitus ([place])  ? [facility] [place], [place], [place]  ? 1 year ago Annual physical exam  ? [facility] Carles Collet M, [place]  ? 2 years ago Type 2 diabetes mellitus with ketoacidotic coma, unspecified whether long term insulin use ([place])  ? [facility] [place], [place], [place]  ? ?  ?  ?Future Appointments   ? ?        ? In 1 month [employer], [facility]   ? ?  ? ?  ?  ?  ?  ?

## 2021-08-09 ENCOUNTER — Telehealth: Payer: Self-pay | Admitting: Family Medicine

## 2021-08-09 NOTE — Telephone Encounter (Signed)
Noted; patient is able to cancel his appt if desired. ?Jacky Kindle, FNP  ?[facility] ?[address] ?[place], [place] [address] ?[phone] (phone) ?[phone] (fax) ?[facility] ? ?Copied from CRM [id number]. Topic: General - Other ?>> Aug 08, 2021  3:46 PM Aretta Nip wrote: ?Pt wants both his visits cancelled on 4/27. He states he already saw the dr in Jan and had blood work done, He does not want to do the AWV Nurse either. He says if he has to do the CPE that he will in about 3 or 4 wks, if his dr request it, but does not feel as he needs. FU with pt and states to pls cancel for the short term. [phone] ?

## 2021-08-11 ENCOUNTER — Encounter: Payer: Medicare Other | Admitting: Family Medicine

## 2021-08-11 NOTE — Progress Notes (Deleted)
Complete physical exam   Patient: Joseph Luna   DOB: 06-Apr-1951   71 y.o. Male  MRN: [id number] Visit Date: 08/11/2021  Today's healthcare provider: Gwyneth Sprout, FNP   No chief complaint on file.  Subjective    Joseph Luna is a 71 y.o. male who presents today for a complete physical exam.  He reports consuming a {diet types:17450} diet. {Exercise:19826} He generally feels {well/fairly well/poorly:18703}. He reports sleeping {well/fairly well/poorly:18703}. He {does/does not:200015} have additional problems to discuss today.   Has AWV with [facility] today a 1:30pm.  HPI  ***  Past Medical History:  Diagnosis Date   Arthritis    Diabetes mellitus without complication ([place])    Hyperlipidemia    Thyroid disease    Past Surgical History:  Procedure Laterality Date   APPENDECTOMY     none     Social History   Socioeconomic History   Marital status: Divorced    Spouse name: Not on file   Number of children: 2   Years of education: Not on file   Highest education level: Associate degree: occupational, Hotel manager, or vocational program  Occupational History   Occupation: retired  Tobacco Use   Smoking status: Every Day    Packs/day: 0.50    Years: 40.00    Pack years: 20.00    Types: Cigarettes   Smokeless tobacco: Never  Vaping Use   Vaping Use: Never used  Substance and Sexual Activity   Alcohol use: Yes    Comment: 1-2 beers occaisonally   Drug use: Never   Sexual activity: Not Currently  Other Topics Concern   Not on file  Social History Narrative   Not on file   Social Determinants of Health   Financial Resource Strain: Not on file  Food Insecurity: Not on file  Transportation Needs: Not on file  Physical Activity: Not on file  Stress: Not on file  Social Connections: Not on file  Intimate Partner Violence: Not on file   Family Status  Relation Name Status   Mother  Deceased   Father  Deceased   Sister  Deceased   Brother  Deceased    Family History  Problem Relation Age of Onset   Cancer Mother    Heart failure Father    Cancer Sister    Allergies  Allergen Reactions   Heparin    Penicillin G Itching    Patient Care Team: Gwyneth Sprout, FNP as PCP - General (Family Medicine) Pa, [place] (Optometry) Lonia Farber, MD as Consulting Physician (Internal Medicine)   Medications: Outpatient Medications Prior to Visit  Medication Sig   acetaminophen (TYLENOL) 500 MG tablet Take 500 mg by mouth every 6 (six) hours as needed.   aspirin EC 81 MG EC tablet Take 1 tablet (81 mg total) by mouth daily.   atorvastatin (LIPITOR) 10 MG tablet TAKE 1 TABLET BY MOUTH  DAILY   gabapentin (NEURONTIN) 600 MG tablet Take 1 tablet (600 mg total) by mouth 3 (three) times daily.   insulin isophane & regular human KwikPen (NOVOLIN 70/30 KWIKPEN) (70-30) 100 UNIT/ML KwikPen Inject 27 Units into the skin 2 (two) times daily with a meal.   methimazole (TAPAZOLE) 10 MG tablet Take 10 mg by mouth daily. Two tablets daily on M,W,F and 1 tablets all other days.   No facility-administered medications prior to visit.    Review of Systems  {Labs  Heme  Chem  Endocrine  Serology  Results Review (optional):23779}  Objective    There were no vitals taken for this visit. {Show previous vital signs (optional):23777}   Physical Exam  ***  Last depression screening scores    05/13/2021    1:28 PM 12/06/2020    1:25 PM 11/05/2019    2:27 PM  PHQ 2/9 Scores  PHQ - 2 Score 0 0 0  PHQ- 9 Score 1 3    Last fall risk screening    12/06/2020    1:25 PM  Fall Risk   Falls in the past year? 0  Number falls in past yr: 0  Injury with Fall? 0  Risk for fall due to : History of fall(s)  Follow up Falls evaluation completed   Last Audit-C alcohol use screening    12/06/2020    1:26 PM  Alcohol Use Disorder Test (AUDIT)  1. How often do you have a drink containing alcohol? 1  2. How many drinks containing alcohol  do you have on a typical day when you are drinking? 0  3. How often do you have six or more drinks on one occasion? 0  AUDIT-C Score 1   A score of 3 or more in women, and 4 or more in men indicates increased risk for alcohol abuse, EXCEPT if all of the points are from question 1   No results found for any visits on 08/11/21.  Assessment & Plan    Routine Health Maintenance and Physical Exam  Exercise Activities and Dietary recommendations  Goals       I don't have any medication insurance coverage" (pt-stated)      Current Barriers:  Knowledge deficit related to where to find information on the cost of Medicare part D coverage  Nurse Case Manager Clinical Goal(s):  Over the next 90 days, patient will make appointment with [facility] office to discuss options for Medicare part D coverage enrollment  Interventions:  Provided patient with contact information for [facility] office: [address] [place], [place] [address] [phone]  Patient Self Care Activities:  Self administers medications as prescribed Attends all scheduled provider appointments Calls pharmacy for medication refills Attends church or other social activities Performs ADL's independently Performs IADL's independently Calls provider office for new concerns or questions  Initial goal documentation       I have not had any diabetes education (pt-stated)      Current Barriers:  Knowledge Deficits related to basic Diabetes pathophysiology and self care/management Financial strain (lack of medication coverage)  Nurse Case Manager Clinical Goal(s):  Over the next 30 days, patient will demonstrate improved adherence to prescribed treatment plan for diabetes self care/management as evidenced by checking blood glucose levels as prescribed, adhering to ADA/low carb diet, daily exercise, and medication adherence-goal met 12/03/2018 Over the next 90 days, patient will demonstrate  continued adherence to prescribed treatment plan for diabetes self care management as evidenced by checkinb blood glucose levels as prescribed, adhering to ADA/low carb diet, daily exercise, and medication adherence  Interventions:  Assessed need for education reinforcement Assessed for medication adherence Discussed plans with patient for ongoing care management follow up and provided patient with direct contact information for care management team Reviewed recent CBG readings Advised patient, providing education and rationale, to check cbg TID and record, calling Dr. Honor Junes for findings outside established parameters.    Patient Self Care Activities:  Self administers medications as prescribed Attends all scheduled provider appointments Checks blood sugars as  prescribed and utilize hyper and hypoglycemia protocol as needed Adhere to ADA diet as discussed Makes eye and dental appointments ASAP Engage with [facility] Pharmacist for ongoing medication management and affordibility   Please see past updates related to this goal by clicking on the "Past Updates" button in the selected goal        Quit Smoking      Recommend to continue efforts to reduce smoking habits until no longer smoking.         Immunization History  Administered Date(s) Administered   Fluad Quad(high Dose 65+) 05/13/2021   Pneumococcal Conjugate-13 12/02/2019   Pneumococcal Polysaccharide-23 12/02/2018   Tdap 05/13/2021    Health Maintenance  Topic Date Due   COVID-19 Vaccine (1) Never done   URINE MICROALBUMIN  06/07/2021   Zoster Vaccines- Shingrix (1 of 2) 08/11/2021 (Originally 06/03/2001)   Fecal DNA (Cologuard)  05/13/2022 (Originally 06/03/1996)   HEMOGLOBIN A1C  11/10/2021   INFLUENZA VACCINE  11/15/2021   FOOT EXAM  06/02/2022   OPHTHALMOLOGY EXAM  06/17/2022   TETANUS/TDAP  05/14/2031   Pneumonia Vaccine 63+ Years old  Completed   Hepatitis C Screening  Completed   HPV VACCINES  Aged Out     Discussed health benefits of physical activity, and encouraged him to engage in regular exercise appropriate for his age and condition.  ***  No follow-ups on file.     {provider attestation***:1}   Gwyneth Sprout, [place] [phone] (phone) [phone] (fax)  [place]

## 2021-08-23 ENCOUNTER — Other Ambulatory Visit: Payer: Self-pay | Admitting: Family Medicine

## 2021-08-23 DIAGNOSIS — E1140 Type 2 diabetes mellitus with diabetic neuropathy, unspecified: Secondary | ICD-10-CM

## 2021-10-12 ENCOUNTER — Other Ambulatory Visit: Payer: Self-pay | Admitting: Family Medicine

## 2021-10-12 DIAGNOSIS — E1111 Type 2 diabetes mellitus with ketoacidosis with coma: Secondary | ICD-10-CM

## 2021-10-13 NOTE — Telephone Encounter (Signed)
Requested Prescriptions  Pending Prescriptions Disp Refills  . atorvastatin (LIPITOR) 10 MG tablet [Pharmacy Med Name: Atorvastatin Calcium 10 MG Oral Tablet] 100 tablet 0    Sig: TAKE 1 TABLET BY MOUTH  DAILY     Cardiovascular:  Antilipid - Statins Failed - 10/12/2021 11:36 PM      Failed - Lipid Panel in normal range within the last 12 months    Cholesterol, Total  Date Value Ref Range Status  05/13/2021 138 100 - 199 mg/dL Final   LDL Chol Calc (NIH)  Date Value Ref Range Status  05/13/2021 49 0 - 99 mg/dL Final   HDL  Date Value Ref Range Status  05/13/2021 30 (L) >39 mg/dL Final   Triglycerides  Date Value Ref Range Status  05/13/2021 397 (H) 0 - 149 mg/dL Final         Passed - Patient is not pregnant      Passed - Valid encounter within last 12 months    Recent Outpatient Visits          5 months ago Type 2 diabetes mellitus with diabetic neuropathy, with long-term current use of insulin [facility])   [facility] Merita Norton T, FNP   10 months ago Hyperlipidemia associated with type 2 diabetes mellitus [facility])   [facility] [place], Jodell Cipro, PA-C   1 year ago Hyperlipidemia associated with type 2 diabetes mellitus [facility])   [facility] [place], Lavella [place], [place]   1 year ago Annual physical exam   [facility] Osvaldo Angst M, [place]   2 years ago Type 2 diabetes mellitus with ketoacidotic coma, unspecified whether long term insulin use [facility])   [facility], [place], [place]

## 2022-01-15 ENCOUNTER — Other Ambulatory Visit: Payer: Self-pay | Admitting: Family Medicine

## 2022-01-15 DIAGNOSIS — E1111 Type 2 diabetes mellitus with ketoacidosis with coma: Secondary | ICD-10-CM

## 2022-01-17 NOTE — Progress Notes (Unsigned)
Established patient visit   Patient: Joseph Luna   DOB: 07-08-1950   71 y.o. Male  MRN: [id number] Visit Date: 01/19/2022  Today's healthcare provider: Gwyneth Sprout, FNP  Re Introduced to nurse practitioner role and practice setting.  All questions answered.  Discussed provider/patient relationship and expectations.   I,Ecko Beasley J Minola Guin,acting as a scribe for Gwyneth Sprout, FNP.,have documented all relevant documentation on the behalf of Gwyneth Sprout, FNP,as directed by  Gwyneth Sprout, FNP while in the presence of Gwyneth Sprout, FNP.   Chief Complaint  Patient presents with   Diabetes   Hyperlipidemia   Hyperthyroidism   Subjective    HPI  Diabetes Mellitus Type II, follow-up  Lab Results  Component Value Date   HGBA1C 5.5 05/13/2021   HGBA1C 6.0 (H) 12/06/2020   HGBA1C 6.1 (H) 06/07/2020   Last seen for diabetes 8 months ago.  Management since then includes continuing the same treatment. He reports excellent compliance with treatment. He is not having side effects.   Home blood sugar records: fasting range: around 135  Episodes of hypoglycemia? Yes rarely   Current insulin regiment: humulin 70/30                                                 Most Recent Eye Exam: around 6 months ago.  --------------------------------------------------------------------------------------------------- Hyperthyroid, follow-up  Lab Results  Component Value Date   TSH 1.810 05/13/2021   TSH 2.250 12/06/2020   TSH 0.969 06/07/2020   FREET4 1.18 05/13/2021   FREET4 1.13 07/30/2018   T4TOTAL 7.3 12/06/2020   T4TOTAL 4.9 06/13/2018    Wt Readings from Last 3 Encounters:  01/19/22 232 lb (105.2 kg)  05/13/21 231 lb 1.6 oz (104.8 kg)  12/06/20 222 lb (100.7 kg)    He was last seen for hypothyroid 8 months ago.  Management since that visit includes no changes. He reports excellent compliance with treatment. He is not having side effects.   Symptoms: No change in  energy level No constipation  No diarrhea No heat / cold intolerance  No nervousness No palpitations  No weight changes    -----------------------------------------------------------------------------------------  --------------------------------------------------------------------------------------------------- Lipid/Cholesterol, follow-up  Last Lipid Panel: Lab Results  Component Value Date   CHOL 138 05/13/2021   LDLCALC 49 05/13/2021   HDL 30 (L) 05/13/2021   TRIG 397 (H) 05/13/2021    He was last seen for this 8 months ago.  Management since that visit includes no changes.  He reports excellent compliance with treatment. He is not having side effects.   Symptoms: No appetite changes No foot ulcerations  No chest pain No chest pressure/discomfort  No dyspnea No orthopnea  No fatigue No lower extremity edema  No palpitations No paroxysmal nocturnal dyspnea  No nausea Yes numbness or tingling of extremity  Yes polydipsia Yes polyuria  No speech difficulty No syncope   He is following a Regular diet. Current exercise: walking, strength training.  Last metabolic panel Lab Results  Component Value Date   GLUCOSE 91 05/13/2021   NA 140 05/13/2021   K 4.6 05/13/2021   BUN 23 05/13/2021   CREATININE 1.15 05/13/2021   EGFR 69 05/13/2021   GFRNONAA 61 06/07/2020   CALCIUM 9.5 05/13/2021   AST 17 05/13/2021   ALT 17 05/13/2021   The 10-year  ASCVD risk score (Arnett DK, et al., 2019) is: 29.5%  ---------------------------------------------------------------------------------------------------   Medications: Outpatient Medications Prior to Visit  Medication Sig   acetaminophen (TYLENOL) 500 MG tablet Take 500 mg by mouth every 6 (six) hours as needed.   aspirin EC 81 MG EC tablet Take 1 tablet (81 mg total) by mouth daily.   atorvastatin (LIPITOR) 10 MG tablet TAKE 1 TABLET BY MOUTH DAILY   gabapentin (NEURONTIN) 600 MG tablet TAKE 1 TABLET BY MOUTH 3 TIMES  DAILY    insulin isophane & regular human KwikPen (NOVOLIN 70/30 KWIKPEN) (70-30) 100 UNIT/ML KwikPen Inject 27 Units into the skin 2 (two) times daily with a meal.   methimazole (TAPAZOLE) 10 MG tablet Take 10 mg by mouth daily. Two tablets daily on M,W,F and 1 tablets all other days.   No facility-administered medications prior to visit.    Review of Systems  Last CBC Lab Results  Component Value Date   WBC 9.8 12/06/2020   HGB 14.3 12/06/2020   HCT 43.0 12/06/2020   MCV 90 12/06/2020   MCH 29.9 12/06/2020   RDW 14.0 12/06/2020   PLT 190 75/01/2584   Last metabolic panel Lab Results  Component Value Date   GLUCOSE 91 05/13/2021   NA 140 05/13/2021   K 4.6 05/13/2021   CL 105 05/13/2021   CO2 20 05/13/2021   BUN 23 05/13/2021   CREATININE 1.15 05/13/2021   EGFR 69 05/13/2021   CALCIUM 9.5 05/13/2021   PHOS 4.0 06/19/2018   PROT 7.0 05/13/2021   ALBUMIN 4.7 05/13/2021   LABGLOB 2.3 05/13/2021   AGRATIO 2.0 05/13/2021   BILITOT <0.2 05/13/2021   ALKPHOS 49 05/13/2021   AST 17 05/13/2021   ALT 17 05/13/2021   ANIONGAP 8 06/19/2018   Last lipids Lab Results  Component Value Date   CHOL 138 05/13/2021   HDL 30 (L) 05/13/2021   LDLCALC 49 05/13/2021   TRIG 397 (H) 05/13/2021   CHOLHDL 4.6 05/13/2021   Last hemoglobin A1c Lab Results  Component Value Date   HGBA1C 5.5 05/13/2021   Last thyroid functions Lab Results  Component Value Date   TSH 1.810 05/13/2021   T4TOTAL 7.3 12/06/2020   Last vitamin D No results found for: "25OHVITD2", "25OHVITD3", "VD25OH" Last vitamin B12 and Folate Lab Results  Component Value Date   VITAMINB12 358 06/07/2020       Objective    BP (!) 105/53 (BP Location: Right Arm, Patient Position: Sitting, Cuff Size: Large)   Pulse 84   Temp 98.2 F (36.8 C) (Oral)   Resp 16   Ht $R'6\' 1"'As$  (1.854 m)   Wt 232 lb (105.2 kg)   SpO2 98%   BMI 30.61 kg/m   BP Readings from Last 3 Encounters:  01/19/22 (!) 105/53  05/13/21 131/63   12/06/20 (!) 118/50   Wt Readings from Last 3 Encounters:  01/19/22 232 lb (105.2 kg)  05/13/21 231 lb 1.6 oz (104.8 kg)  12/06/20 222 lb (100.7 kg)   SpO2 Readings from Last 3 Encounters:  01/19/22 98%  05/13/21 99%  12/06/20 99%      Physical Exam Vitals and nursing note reviewed.  Constitutional:      Appearance: Normal appearance. He is obese.  HENT:     Head: Normocephalic and atraumatic.  Eyes:     Pupils: Pupils are equal, round, and reactive to light.  Cardiovascular:     Rate and Rhythm: Normal rate and regular rhythm.  Pulses: Normal pulses.     Heart sounds: Normal heart sounds.  Pulmonary:     Effort: Pulmonary effort is normal.     Breath sounds: Normal breath sounds.  Musculoskeletal:        General: Normal range of motion.     Cervical back: Normal range of motion.  Skin:    General: Skin is warm and dry.     Capillary Refill: Capillary refill takes less than 2 seconds.  Neurological:     General: No focal deficit present.     Mental Status: He is alert and oriented to person, place, and time. Mental status is at baseline.  Psychiatric:        Mood and Affect: Mood normal.        Behavior: Behavior normal.        Thought Content: Thought content normal.        Judgment: Judgment normal.      No results found for any visits on 01/19/22.  Assessment & Plan     Problem List Items Addressed This Visit       Cardiovascular and Mediastinum   Dilated cardiomyopathy ([place])    Chronic, stable Encourage HLD and DM control as well as smoking cessation; pt remains pre-contempative at this time regarding smoking cessation       Relevant Orders   CBC with Differential/Platelet   Comprehensive Metabolic Panel (CMET)     Endocrine   Hyperlipidemia associated with type 2 diabetes mellitus ([place])    Chronic, previously stable On lipitor 10 mg  Repeat LP      Relevant Orders   Lipid panel   Hyperthyroidism    Chronic, stable HR normal; no  weight changes Repeat TSH and free T4 Previously managed on tapazole 10 mg tablets       Relevant Orders   TSH + free T4   Type 2 diabetes mellitus with diabetic neuropathy (HCC) - Primary    Chronic, previously stable Recommend A1c and urine micro albumin Continue to recommend balanced, lower carb meals. Smaller meal size, adding snacks. Choosing water as drink of choice and increasing purposeful exercise.       Relevant Orders   Hemoglobin A1c   Urine Microalbumin w/creat. ratio     Other   Need for influenza vaccination    Consented; VIS made available; no immediate side effects following administration; plan to repeat annually       Relevant Orders   Flu Vaccine QUAD High Dose(Fluad) (Completed)   Screening PSA (prostate specific antigen)    Recommend PSA given frequency concerns despite previously normal A1c      Relevant Orders   PSA    Return in about 6 months (around 07/21/2022).      Vonna Kotyk, FNP, have reviewed all documentation for this visit. The documentation on 01/19/22 for the exam, diagnosis, procedures, and orders are all accurate and complete.    Gwyneth Sprout, [place] [phone] (phone) [phone] (fax)  [place]

## 2022-01-19 ENCOUNTER — Ambulatory Visit (INDEPENDENT_AMBULATORY_CARE_PROVIDER_SITE_OTHER): Payer: Medicare Other | Admitting: Family Medicine

## 2022-01-19 ENCOUNTER — Encounter: Payer: Self-pay | Admitting: Family Medicine

## 2022-01-19 VITALS — BP 105/53 | HR 84 | Temp 98.2°F | Resp 16 | Ht 73.0 in | Wt 232.0 lb

## 2022-01-19 DIAGNOSIS — Z23 Encounter for immunization: Secondary | ICD-10-CM

## 2022-01-19 DIAGNOSIS — E1140 Type 2 diabetes mellitus with diabetic neuropathy, unspecified: Secondary | ICD-10-CM | POA: Diagnosis not present

## 2022-01-19 DIAGNOSIS — I420 Dilated cardiomyopathy: Secondary | ICD-10-CM

## 2022-01-19 DIAGNOSIS — E0590 Thyrotoxicosis, unspecified without thyrotoxic crisis or storm: Secondary | ICD-10-CM

## 2022-01-19 DIAGNOSIS — E785 Hyperlipidemia, unspecified: Secondary | ICD-10-CM

## 2022-01-19 DIAGNOSIS — Z794 Long term (current) use of insulin: Secondary | ICD-10-CM

## 2022-01-19 DIAGNOSIS — R350 Frequency of micturition: Secondary | ICD-10-CM | POA: Diagnosis not present

## 2022-01-19 DIAGNOSIS — E1169 Type 2 diabetes mellitus with other specified complication: Secondary | ICD-10-CM | POA: Diagnosis not present

## 2022-01-19 DIAGNOSIS — Z125 Encounter for screening for malignant neoplasm of prostate: Secondary | ICD-10-CM

## 2022-01-19 NOTE — Assessment & Plan Note (Signed)
Recommend PSA given frequency concerns despite previously normal A1c

## 2022-01-19 NOTE — Assessment & Plan Note (Signed)
Chronic, previously stable On lipitor 10 mg  Repeat LP

## 2022-01-19 NOTE — Assessment & Plan Note (Signed)
Chronic, stable HR normal; no weight changes Repeat TSH and free T4 Previously managed on tapazole 10 mg tablets

## 2022-01-19 NOTE — Assessment & Plan Note (Signed)
Consented; VIS made available; no immediate side effects following administration; plan to repeat annually   

## 2022-01-19 NOTE — Assessment & Plan Note (Signed)
Chronic, stable Encourage HLD and DM control as well as smoking cessation; pt remains pre-contempative at this time regarding smoking cessation

## 2022-01-19 NOTE — Assessment & Plan Note (Signed)
Chronic, previously stable Recommend A1c and urine micro albumin Continue to recommend balanced, lower carb meals. Smaller meal size, adding snacks. Choosing water as drink of choice and increasing purposeful exercise.

## 2022-01-20 ENCOUNTER — Telehealth: Payer: Self-pay

## 2022-01-20 LAB — LIPID PANEL
Chol/HDL Ratio: 3.7 ratio (ref 0.0–5.0)
Cholesterol, Total: 107 mg/dL (ref 100–199)
HDL: 29 mg/dL — ABNORMAL LOW (ref >39)
LDL Chol Calc (NIH): 44 mg/dL (ref 0–99)
Triglycerides: 212 mg/dL — ABNORMAL HIGH (ref 0–149)
VLDL Cholesterol Cal: 34 mg/dL (ref 5–40)

## 2022-01-20 LAB — CBC WITH DIFFERENTIAL/PLATELET
Basophils Absolute: 0 10*3/uL (ref 0.0–0.2)
Basos: 0 %
EOS (ABSOLUTE): 0.2 10*3/uL (ref 0.0–0.4)
Eos: 3 %
Hematocrit: 40.9 % (ref 37.5–51.0)
Hemoglobin: 13.9 g/dL (ref 13.0–17.7)
Immature Grans (Abs): 0 10*3/uL (ref 0.0–0.1)
Immature Granulocytes: 0 %
Lymphocytes Absolute: 1.1 10*3/uL (ref 0.7–3.1)
Lymphs: 16 %
MCH: 30.9 pg (ref 26.6–33.0)
MCHC: 34 g/dL (ref 31.5–35.7)
MCV: 91 fL (ref 79–97)
Monocytes Absolute: 0.5 10*3/uL (ref 0.1–0.9)
Monocytes: 7 %
Neutrophils Absolute: 5.4 10*3/uL (ref 1.4–7.0)
Neutrophils: 74 %
Platelets: 158 10*3/uL (ref 150–450)
RBC: 4.5 x10E6/uL (ref 4.14–5.80)
RDW: 13.9 % (ref 11.6–15.4)
WBC: 7.3 10*3/uL (ref 3.4–10.8)

## 2022-01-20 LAB — MICROALBUMIN / CREATININE URINE RATIO
Creatinine, Urine: 198.1 mg/dL
Microalb/Creat Ratio: 3 mg/g creat (ref 0–29)
Microalbumin, Urine: 5.8 ug/mL

## 2022-01-20 LAB — COMPREHENSIVE METABOLIC PANEL
ALT: 16 IU/L (ref 0–44)
AST: 15 IU/L (ref 0–40)
Albumin/Globulin Ratio: 2 (ref 1.2–2.2)
Albumin: 4.2 g/dL (ref 3.9–4.9)
Alkaline Phosphatase: 43 IU/L — ABNORMAL LOW (ref 44–121)
BUN/Creatinine Ratio: 14 (ref 10–24)
BUN: 17 mg/dL (ref 8–27)
Bilirubin Total: 0.4 mg/dL (ref 0.0–1.2)
CO2: 22 mmol/L (ref 20–29)
Calcium: 8.9 mg/dL (ref 8.6–10.2)
Chloride: 103 mmol/L (ref 96–106)
Creatinine, Ser: 1.23 mg/dL (ref 0.76–1.27)
Globulin, Total: 2.1 g/dL (ref 1.5–4.5)
Glucose: 117 mg/dL — ABNORMAL HIGH (ref 70–99)
Potassium: 5.2 mmol/L (ref 3.5–5.2)
Sodium: 138 mmol/L (ref 134–144)
Total Protein: 6.3 g/dL (ref 6.0–8.5)
eGFR: 63 mL/min/{1.73_m2} (ref >59)

## 2022-01-20 LAB — HEMOGLOBIN A1C
Est. average glucose Bld gHb Est-mCnc: 126 mg/dL
Hgb A1c MFr Bld: 6 % — ABNORMAL HIGH (ref 4.8–5.6)

## 2022-01-20 LAB — PSA: Prostate Specific Ag, Serum: 1.3 ng/mL (ref 0.0–4.0)

## 2022-01-20 LAB — TSH+FREE T4
Free T4: 1.02 ng/dL (ref 0.82–1.77)
TSH: 2.49 u[IU]/mL (ref 0.450–4.500)

## 2022-01-20 NOTE — Progress Notes (Signed)
A1c has increased; however, still at goal of <7%. Continue to recommend balanced, lower carb meals. Smaller meal size, adding snacks. Choosing water as drink of choice and increasing purposeful exercise.  Cholesterol shows great reduction in fats/triglycerides. I recommend diet low in saturated fat and regular exercise - 30 min at least 5 times per week  All other labs are normal/stable.  Urine is pending.  Please let us know if you have any questions.  Thank you, Gwyneth Sprout, [address] [place], [place] [address] [phone] (phone) [phone] (fax) [place]

## 2022-01-20 NOTE — Telephone Encounter (Signed)
Pt called back for lab results.  Gwyneth Sprout, FNP  01/20/2022  2:04 PM EDT     Urine is stable; repeat in 1 year.   Gwyneth Sprout, [address] [place], [place] [address] [phone] (phone) [phone] (fax) [place]

## 2022-01-20 NOTE — Progress Notes (Signed)
Urine is stable; repeat in 1 year.  Gwyneth Sprout, [address] [place], [place]  [address] [phone] (phone) [phone] (fax) [place]

## 2022-01-26 DIAGNOSIS — E1165 Type 2 diabetes mellitus with hyperglycemia: Secondary | ICD-10-CM | POA: Diagnosis not present

## 2022-01-26 DIAGNOSIS — E0590 Thyrotoxicosis, unspecified without thyrotoxic crisis or storm: Secondary | ICD-10-CM | POA: Diagnosis not present

## 2022-01-26 DIAGNOSIS — Z794 Long term (current) use of insulin: Secondary | ICD-10-CM | POA: Diagnosis not present

## 2022-03-28 ENCOUNTER — Other Ambulatory Visit: Payer: Self-pay | Admitting: Family Medicine

## 2022-03-28 DIAGNOSIS — E1111 Type 2 diabetes mellitus with ketoacidosis with coma: Secondary | ICD-10-CM

## 2022-03-29 NOTE — Telephone Encounter (Signed)
Requested Prescriptions  Pending Prescriptions Disp Refills   atorvastatin (LIPITOR) 10 MG tablet [Pharmacy Med Name: Atorvastatin Calcium 10 MG Oral Tablet] 100 tablet 2    Sig: TAKE 1 TABLET BY MOUTH DAILY     Cardiovascular:  Antilipid - Statins Failed - 03/28/2022 10:28 PM      Failed - Lipid Panel in normal range within the last 12 months    Cholesterol, Total  Date Value Ref Range Status  01/19/2022 107 100 - 199 mg/dL Final   LDL Chol Calc (NIH)  Date Value Ref Range Status  01/19/2022 44 0 - 99 mg/dL Final   HDL  Date Value Ref Range Status  01/19/2022 29 (L) >39 mg/dL Final   Triglycerides  Date Value Ref Range Status  01/19/2022 212 (H) 0 - 149 mg/dL Final         Passed - Patient is not pregnant      Passed - Valid encounter within last 12 months    Recent Outpatient Visits           2 months ago Type 2 diabetes mellitus with diabetic neuropathy, with long-term current use of insulin [facility])   [facility] Merita Norton T, FNP   10 months ago Type 2 diabetes mellitus with diabetic neuropathy, with long-term current use of insulin [facility])   [facility] Merita Norton T, FNP   1 year ago Hyperlipidemia associated with type 2 diabetes mellitus [facility])   [facility] Chrismon, Jodell Cipro, PA-C   1 year ago Hyperlipidemia associated with type 2 diabetes mellitus [facility])   [facility] [place], Lavella Hammock, [place]   2 years ago Annual physical exam   [facility] Osvaldo Angst M, [place]

## 2022-05-18 ENCOUNTER — Other Ambulatory Visit: Payer: Self-pay | Admitting: Family Medicine

## 2022-05-18 DIAGNOSIS — E1140 Type 2 diabetes mellitus with diabetic neuropathy, unspecified: Secondary | ICD-10-CM

## 2022-07-19 ENCOUNTER — Encounter: Payer: 59 | Admitting: Family Medicine

## 2022-08-02 DIAGNOSIS — E0590 Thyrotoxicosis, unspecified without thyrotoxic crisis or storm: Secondary | ICD-10-CM | POA: Diagnosis not present

## 2022-08-02 DIAGNOSIS — Z794 Long term (current) use of insulin: Secondary | ICD-10-CM | POA: Diagnosis not present

## 2022-08-02 DIAGNOSIS — E1165 Type 2 diabetes mellitus with hyperglycemia: Secondary | ICD-10-CM | POA: Diagnosis not present

## 2022-08-03 DIAGNOSIS — E1165 Type 2 diabetes mellitus with hyperglycemia: Secondary | ICD-10-CM | POA: Diagnosis not present

## 2022-08-09 ENCOUNTER — Ambulatory Visit (INDEPENDENT_AMBULATORY_CARE_PROVIDER_SITE_OTHER): Payer: 59 | Admitting: Family Medicine

## 2022-08-09 ENCOUNTER — Encounter: Payer: Self-pay | Admitting: Family Medicine

## 2022-08-09 VITALS — BP 129/74 | HR 88 | Temp 98.4°F | Ht 73.0 in | Wt 225.0 lb

## 2022-08-09 DIAGNOSIS — E1140 Type 2 diabetes mellitus with diabetic neuropathy, unspecified: Secondary | ICD-10-CM

## 2022-08-09 DIAGNOSIS — Z2821 Immunization not carried out because of patient refusal: Secondary | ICD-10-CM | POA: Diagnosis not present

## 2022-08-09 DIAGNOSIS — I420 Dilated cardiomyopathy: Secondary | ICD-10-CM

## 2022-08-09 DIAGNOSIS — Z5320 Procedure and treatment not carried out because of patient's decision for unspecified reasons: Secondary | ICD-10-CM

## 2022-08-09 DIAGNOSIS — F4310 Post-traumatic stress disorder, unspecified: Secondary | ICD-10-CM | POA: Diagnosis not present

## 2022-08-09 DIAGNOSIS — Z0000 Encounter for general adult medical examination without abnormal findings: Secondary | ICD-10-CM | POA: Insufficient documentation

## 2022-08-09 DIAGNOSIS — E1169 Type 2 diabetes mellitus with other specified complication: Secondary | ICD-10-CM

## 2022-08-09 DIAGNOSIS — E785 Hyperlipidemia, unspecified: Secondary | ICD-10-CM | POA: Diagnosis not present

## 2022-08-09 DIAGNOSIS — F410 Panic disorder [episodic paroxysmal anxiety] without agoraphobia: Secondary | ICD-10-CM | POA: Diagnosis not present

## 2022-08-09 DIAGNOSIS — Z794 Long term (current) use of insulin: Secondary | ICD-10-CM | POA: Diagnosis not present

## 2022-08-09 DIAGNOSIS — Z125 Encounter for screening for malignant neoplasm of prostate: Secondary | ICD-10-CM

## 2022-08-09 MED ORDER — DIAZEPAM 5 MG PO TABS
5.0000 mg | ORAL_TABLET | Freq: Two times a day (BID) | ORAL | 0 refills | Status: DC | PRN
Start: 2022-08-09 — End: 2023-01-09

## 2022-08-09 NOTE — Assessment & Plan Note (Signed)
Chronic, stable Repeat urine micro A1c completed by endo Declines foot exam today Informed of [facility] DM eye exam Reports continued use 27 units BID of 70/30

## 2022-08-09 NOTE — Assessment & Plan Note (Signed)
Due for dental Due for vision Declines colon Declines lung Declines vaccinations Declines tobacco cessation Things to do to keep yourself healthy  - Exercise at least 30-45 minutes a day, 3-4 days a week.  - Eat a low-fat diet with lots of fruits and vegetables, up to 7-9 servings per day.  - Seatbelts can save your life. Wear them always.  - Smoke detectors on every level of your home, check batteries every year.  - Eye Doctor - have an eye exam every 1-2 years  - Safe sex - if you may be exposed to STDs, use a condom.  - Alcohol -  If you drink, do it moderately, less than 2 drinks per day.  - Health Care Power of Attorney. Choose someone to speak for you if you are not able.  - Depression is common in our stressful world.If you're feeling down or losing interest in things you normally enjoy, please come in for a visit.  - Violence - If anyone is threatening or hurting you, please call immediately.

## 2022-08-09 NOTE — Assessment & Plan Note (Signed)
Chronic worsening; reports extensive psych background including work in [employer] Discussed multiple options; patient wishes to proceed with low dose PRN benzo to assist PTSD symptoms Request for mail order delivery d/t cost 6 month RTC recommended

## 2022-08-09 NOTE — Assessment & Plan Note (Signed)
Chronic, stable Followed by [facility] BP stable today Repeat CBC and CMP

## 2022-08-09 NOTE — Assessment & Plan Note (Signed)
Denies LUTS; recommend PSA in place of DRE. If PSA is elevated for age, we will repeat; if PSA remains elevated pt will be referred to urology for DRE and next steps for best treatment.  

## 2022-08-09 NOTE — Assessment & Plan Note (Signed)
Declines wellness screening outside of blood work

## 2022-08-09 NOTE — Progress Notes (Signed)
   Patient seen previous day; should not be charged for a no show  Jacky Kindle, FNP  [facility] [phone] (phone) [phone] (fax)  [facility]

## 2022-08-09 NOTE — Assessment & Plan Note (Signed)
Chronic, previously stable Repeat LP Noted on 10 mg lipitor without complaints recommend diet low in saturated fat and regular exercise - 30 min at least 5 times per week

## 2022-08-09 NOTE — Progress Notes (Signed)
I,J'ya E Hunter,acting as a scribe for Jacky Kindle, FNP.,have documented all relevant documentation on the behalf of Jacky Kindle, FNP,as directed by  Jacky Kindle, FNP while in the presence of Jacky Kindle, FNP.  Complete physical exam  Patient: Joseph Luna   DOB: Jun 12, 1950   72 y.o. Male  MRN: [id number] Visit Date: 08/09/2022  Today's healthcare provider: Jacky Kindle, FNP  Re Introduced to nurse practitioner role and practice setting.  All questions answered.  Discussed provider/patient relationship and expectations.  Chief Complaint  Patient presents with   Annual Exam   Subjective    RODERIC LAMMERT is a 72 y.o. male who presents today for a complete physical exam.  He reports consuming a general diet. Home exercise routine includes walking   hrs per day. He generally feels fairly well. He reports sleeping well, he reports that he is unable to sleep more than 3 hours a night. He does have additional problems to discuss today.   Patient reports that over the last two weeks he has been prone to frequent panic attacks with no identifiable stressors.  HPI   Past Medical History:  Diagnosis Date   Arthritis    Diabetes mellitus without complication    Hyperlipidemia    Thyroid disease    Past Surgical History:  Procedure Laterality Date   APPENDECTOMY     none     Social History   Socioeconomic History   Marital status: Divorced    Spouse name: Not on file   Number of children: 2   Years of education: Not on file   Highest education level: Associate degree: occupational, Scientist, product/process development, or vocational program  Occupational History   Occupation: retired  Tobacco Use   Smoking status: Every Day    Packs/day: 0.50    Years: 40.00    Additional pack years: 0.00    Total pack years: 20.00    Types: Cigarettes   Smokeless tobacco: Never  Vaping Use   Vaping Use: Never used  Substance and Sexual Activity   Alcohol use: Yes    Comment: 1-2 beers occaisonally    Drug use: Never   Sexual activity: Not Currently  Other Topics Concern   Not on file  Social History Narrative   Not on file   Social Determinants of Health   Financial Resource Strain: Low Risk  (11/05/2019)   Overall Financial Resource Strain (CARDIA)    Difficulty of Paying Living Expenses: Not hard at all  Food Insecurity: No Food Insecurity (11/05/2019)   Hunger Vital Sign    Worried About Running Out of Food in the Last Year: Never true    Ran Out of Food in the Last Year: Never true  Transportation Needs: No Transportation Needs (11/05/2019)   PRAPARE - Administrator, Civil Service (Medical): No    Lack of Transportation (Non-Medical): No  Physical Activity: Inactive (11/05/2019)   Exercise Vital Sign    Days of Exercise per Week: 0 days    Minutes of Exercise per Session: 0 min  Stress: No Stress Concern Present (11/05/2019)   [employer] of Occupational Health - Occupational Stress Questionnaire    Feeling of Stress : Not at all  Social Connections: Socially Isolated (11/05/2019)   Social Connection and Isolation Panel [NHANES]    Frequency of Communication with Friends and Family: More than three times a week    Frequency of Social Gatherings with Friends and Family: Once a  week    Attends Religious Services: Never    Active Member of Clubs or Organizations: No    Attends Banker Meetings: Never    Marital Status: Divorced  Catering manager Violence: Not At Risk (11/05/2019)   Humiliation, Afraid, Rape, and Kick questionnaire    Fear of Current or Ex-Partner: No    Emotionally Abused: No    Physically Abused: No    Sexually Abused: No   Family Status  Relation Name Status   Mother  Deceased   Father  Deceased   Sister  Deceased   Brother  Deceased   Family History  Problem Relation Age of Onset   Cancer Mother    Heart failure Father    Cancer Sister    Allergies  Allergen Reactions   Heparin    Penicillin G Itching     Patient Care Team: Jacky Kindle, FNP as PCP - General (Family Medicine) Pa, [facility] (Optometry) Sherlon Handing, MD as Consulting Physician (Internal Medicine)   Medications: Outpatient Medications Prior to Visit  Medication Sig   acetaminophen (TYLENOL) 500 MG tablet Take 500 mg by mouth every 6 (six) hours as needed.   aspirin EC 81 MG EC tablet Take 1 tablet (81 mg total) by mouth daily.   atorvastatin (LIPITOR) 10 MG tablet TAKE 1 TABLET BY MOUTH DAILY   gabapentin (NEURONTIN) 600 MG tablet TAKE 1 TABLET BY MOUTH 3 TIMES  DAILY   insulin isophane & regular human KwikPen (NOVOLIN 70/30 KWIKPEN) (70-30) 100 UNIT/ML KwikPen Inject 27 Units into the skin 2 (two) times daily with a meal.   methimazole (TAPAZOLE) 10 MG tablet Take 10 mg by mouth daily. Two tablets daily on M,W,F and 1 tablets all other days.   No facility-administered medications prior to visit.    Review of Systems  Eyes:  Positive for visual disturbance.  Endocrine: Positive for polyuria.  Musculoskeletal:  Positive for myalgias.  Neurological:  Positive for numbness.  Hematological:  Bruises/bleeds easily.  Psychiatric/Behavioral:  Positive for agitation. The patient is nervous/anxious.     Objective    BP 129/74 (BP Location: Right Arm, Patient Position: Sitting, Cuff Size: Large)   Pulse 88   Temp 98.4 F (36.9 C) (Oral)   Ht  (1.854 m)   Wt 225 lb (102.1 kg)   SpO2 100%   BMI 29.69 kg/m   Physical Exam Vitals and nursing note reviewed.  Constitutional:      General: He is awake. He is not in acute distress.    Appearance: Normal appearance. He is well-developed, well-groomed and overweight. He is not ill-appearing, toxic-appearing or diaphoretic.  HENT:     Head: Normocephalic and atraumatic.     Jaw: There is normal jaw occlusion. No trismus, tenderness, swelling or pain on movement.     Salivary Glands: Right salivary gland is not diffusely enlarged or tender. Left salivary  gland is not diffusely enlarged or tender.     Right Ear: Hearing, tympanic membrane, ear canal and external ear normal. There is no impacted cerumen.     Left Ear: Hearing, tympanic membrane, ear canal and external ear normal. There is no impacted cerumen.     Nose: Nose normal. No congestion or rhinorrhea.     Right Turbinates: Not enlarged, swollen or pale.     Left Turbinates: Not enlarged, swollen or pale.     Right Sinus: No maxillary sinus tenderness or frontal sinus tenderness.  Left Sinus: No maxillary sinus tenderness or frontal sinus tenderness.     Mouth/Throat:     Lips: Pink.     Mouth: Mucous membranes are moist. No injury, lacerations, oral lesions or angioedema.     Pharynx: Oropharynx is clear. Uvula midline. No pharyngeal swelling, oropharyngeal exudate or posterior oropharyngeal erythema.     Tonsils: No tonsillar exudate or tonsillar abscesses.  Eyes:     General: Lids are normal. Vision grossly intact. Gaze aligned appropriately.        Right eye: No discharge.        Left eye: No discharge.     Extraocular Movements: Extraocular movements intact.     Conjunctiva/sclera: Conjunctivae normal.     Pupils: Pupils are equal, round, and reactive to light.  Neck:     Thyroid: No thyroid mass, thyromegaly or thyroid tenderness.     Vascular: No carotid bruit.     Trachea: Trachea normal. No tracheal tenderness.  Cardiovascular:     Rate and Rhythm: Normal rate and regular rhythm.     Pulses: Normal pulses.          Carotid pulses are 2+ on the right side and 2+ on the left side.      Radial pulses are 2+ on the right side and 2+ on the left side.       Femoral pulses are 2+ on the right side and 2+ on the left side.      Popliteal pulses are 2+ on the right side and 2+ on the left side.       Dorsalis pedis pulses are 2+ on the right side and 2+ on the left side.       Posterior tibial pulses are 2+ on the right side and 2+ on the left side.     Heart sounds: Normal  heart sounds, S1 normal and S2 normal. No murmur heard.    No friction rub. No gallop.  Pulmonary:     Effort: Pulmonary effort is normal. No respiratory distress.     Breath sounds: Normal breath sounds and air entry. No stridor. No wheezing, rhonchi or rales.  Chest:     Chest wall: No tenderness.  Abdominal:     General: Abdomen is flat. Bowel sounds are normal. There is no distension.     Palpations: Abdomen is soft. There is no mass.     Tenderness: There is no abdominal tenderness. There is no guarding or rebound.     Hernia: No hernia is present.  Genitourinary:    Comments: Exam deferred; denies complaints Musculoskeletal:        General: No swelling, tenderness, deformity or signs of injury. Normal range of motion.     Cervical back: Normal range of motion and neck supple. No rigidity or tenderness.     Right lower leg: No edema.     Left lower leg: No edema.  Lymphadenopathy:     Cervical: No cervical adenopathy.     Right cervical: No superficial, deep or posterior cervical adenopathy.    Left cervical: No superficial, deep or posterior cervical adenopathy.  Skin:    General: Skin is warm and dry.     Capillary Refill: Capillary refill takes less than 2 seconds.     Coloration: Skin is not jaundiced or pale.     Findings: No bruising, erythema, lesion or rash.  Neurological:     General: No focal deficit present.     Mental Status: He is  alert and oriented to person, place, and time. Mental status is at baseline.     GCS: GCS eye subscore is 4. GCS verbal subscore is 5. GCS motor subscore is 6.     Sensory: Sensation is intact. No sensory deficit.     Motor: Motor function is intact. No weakness.     Coordination: Coordination is intact.     Gait: Gait is intact.     Comments: Use of cane given chronic DM neuropathy  Psychiatric:        Attention and Perception: Attention and perception normal.        Mood and Affect: Mood and affect normal.        Speech: Speech  normal.        Behavior: Behavior normal. Behavior is cooperative.        Thought Content: Thought content normal.        Cognition and Memory: Cognition normal.        Judgment: Judgment normal.      Last depression screening scores    08/09/2022   10:10 AM 01/19/2022    9:56 AM 05/13/2021    1:28 PM  PHQ 2/9 Scores  PHQ - 2 Score 3 0 0  PHQ- 9 Score 5 4 1    Last fall risk screening    08/09/2022   10:10 AM  Fall Risk   Falls in the past year? 0  Number falls in past yr: 0  Injury with Fall? 0   Last Audit-C alcohol use screening    08/09/2022   10:11 AM  Alcohol Use Disorder Test (AUDIT)  1. How often do you have a drink containing alcohol? 2  2. How many drinks containing alcohol do you have on a typical day when you are drinking? 0  3. How often do you have six or more drinks on one occasion? 0  AUDIT-C Score 2   A score of 3 or more in women, and 4 or more in men indicates increased risk for alcohol abuse, EXCEPT if all of the points are from question 1   No results found for any visits on 08/09/22.  Assessment & Plan    Routine Health Maintenance and Physical Exam  Exercise Activities and Dietary recommendations  Goals       I don't have any medication insurance coverage" (pt-stated)      Current Barriers:  Knowledge deficit related to where to find information on the cost of Medicare part D coverage  Nurse Case Manager Clinical Goal(s):  Over the next 90 days, patient will make appointment with [facility] office to discuss options for Medicare part D coverage enrollment  Interventions:  Provided patient with contact information for [facility] office: [facility] [address] [place], [place] [address] [phone]  Patient Self Care Activities:  Self administers medications as prescribed Attends all scheduled provider appointments Calls pharmacy for medication refills Attends church or other social activities Performs ADL's  independently Performs IADL's independently Calls provider office for new concerns or questions  Initial goal documentation       I have not had any diabetes education (pt-stated)      Current Barriers:  Knowledge Deficits related to basic Diabetes pathophysiology and self care/management Financial strain (lack of medication coverage)  Nurse Case Manager Clinical Goal(s):  Over the next 30 days, patient will demonstrate improved adherence to prescribed treatment plan for diabetes self care/management as evidenced by checking blood glucose levels as prescribed, adhering  to ADA/low carb diet, daily exercise, and medication adherence-goal met 12/03/2018 Over the next 90 days, patient will demonstrate continued adherence to prescribed treatment plan for diabetes self care management as evidenced by checkinb blood glucose levels as prescribed, adhering to ADA/low carb diet, daily exercise, and medication adherence  Interventions:  Assessed need for education reinforcement Assessed for medication adherence Discussed plans with patient for ongoing care management follow up and provided patient with direct contact information for care management team Reviewed recent CBG readings Advised patient, providing education and rationale, to check cbg TID and record, calling Dr. Gershon Crane for findings outside established parameters.    Patient Self Care Activities:  Self administers medications as prescribed Attends all scheduled provider appointments Checks blood sugars as prescribed and utilize hyper and hypoglycemia protocol as needed Adhere to ADA diet as discussed Makes eye and dental appointments ASAP Engage with [facility] Pharmacist for ongoing medication management and affordibility   Please see past updates related to this goal by clicking on the "Past Updates" button in the selected goal        Quit Smoking      Recommend to continue efforts to reduce smoking habits until no longer  smoking.         Immunization History  Administered Date(s) Administered   Fluad Quad(high Dose 65+) 05/13/2021, 01/19/2022   Pneumococcal Conjugate-13 12/02/2019   Pneumococcal Polysaccharide-23 12/02/2018   Tdap 05/13/2021    Health Maintenance  Topic Date Due   COVID-19 Vaccine (1) Never done   Fecal DNA (Cologuard)  Never done   Lung Cancer Screening  Never done   Zoster Vaccines- Shingrix (1 of 2) Never done   FOOT EXAM  06/02/2022   OPHTHALMOLOGY EXAM  06/17/2022   HEMOGLOBIN A1C  07/21/2022   INFLUENZA VACCINE  11/16/2022   Diabetic kidney evaluation - eGFR measurement  01/20/2023   Diabetic kidney evaluation - Urine ACR  01/20/2023   Medicare Annual Wellness (AWV)  08/09/2023   DTaP/Tdap/Td (2 - Td or Tdap) 05/14/2031   Pneumonia Vaccine 39+ Years old  Completed   Hepatitis C Screening  Completed   HPV VACCINES  Aged Out    Discussed health benefits of physical activity, and encouraged him to engage in regular exercise appropriate for his age and condition.  Problem List Items Addressed This Visit       Cardiovascular and Mediastinum   Dilated cardiomyopathy    Chronic, stable Followed by [facility] BP stable today Repeat CBC and CMP        Endocrine   Hyperlipidemia associated with type 2 diabetes mellitus    Chronic, previously stable Repeat LP Noted on 10 mg lipitor without complaints recommend diet low in saturated fat and regular exercise - 30 min at least 5 times per week       Relevant Orders   Lipid panel   Type 2 diabetes mellitus with diabetic neuropathy    Chronic, stable Repeat urine micro A1c completed by endo Declines foot exam today Informed of [facility] DM eye exam Reports continued use 27 units BID of 70/30      Relevant Orders   CBC with Differential/Platelet   Comprehensive Metabolic Panel (CMET)   Urine Microalbumin w/creat. ratio     Other   Annual physical exam    Due for dental Due for vision Declines colon Declines  lung Declines vaccinations Declines tobacco cessation Things to do to keep yourself healthy  - Exercise at least 30-45 minutes a day, 3-4 days  a week.  - Eat a low-fat diet with lots of fruits and vegetables, up to 7-9 servings per day.  - Seatbelts can save your life. Wear them always.  - Smoke detectors on every level of your home, check batteries every year.  - Eye Doctor - have an eye exam every 1-2 years  - Safe sex - if you may be exposed to STDs, use a condom.  - Alcohol -  If you drink, do it moderately, less than 2 drinks per day.  - Health Care Power of Attorney. Choose someone to speak for you if you are not able.  - Depression is common in our stressful world.If you're feeling down or losing interest in things you normally enjoy, please come in for a visit.  - Violence - If anyone is threatening or hurting you, please call immediately.       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 If PSA is elevated for age, we will repeat; if PSA remains elevated pt will be referred to urology for DRE and next steps for best treatment.        Relevant Orders   PSA   Vaccination declined   Return in about 6 months (around 02/08/2023) for chonic disease management.    Leilani Merl, FNP, have reviewed all documentation for this  visit. The documentation on 08/09/22 for the exam, diagnosis, procedures, and orders are all accurate and complete.  Jacky Kindle, FNP  [facility] [phone] (phone) [phone] (fax)  [facility]

## 2022-08-10 ENCOUNTER — Ambulatory Visit (INDEPENDENT_AMBULATORY_CARE_PROVIDER_SITE_OTHER): Payer: 59 | Admitting: Family Medicine

## 2022-08-10 DIAGNOSIS — Z91199 Patient's noncompliance with other medical treatment and regimen due to unspecified reason: Secondary | ICD-10-CM

## 2022-08-10 LAB — MICROALBUMIN / CREATININE URINE RATIO
Creatinine, Urine: 34.2 mg/dL
Microalb/Creat Ratio: <9 mg/g creat (ref 0–29)
Microalbumin, Urine: <3 ug/mL

## 2022-08-10 LAB — CBC WITH DIFFERENTIAL/PLATELET
Basophils Absolute: 0 10*3/uL (ref 0.0–0.2)
Basos: 0 %
EOS (ABSOLUTE): 0.2 10*3/uL (ref 0.0–0.4)
Eos: 3 %
Hematocrit: 42.5 % (ref 37.5–51.0)
Hemoglobin: 14.1 g/dL (ref 13.0–17.7)
Immature Grans (Abs): 0 10*3/uL (ref 0.0–0.1)
Immature Granulocytes: 0 %
Lymphocytes Absolute: 1.6 10*3/uL (ref 0.7–3.1)
Lymphs: 21 %
MCH: 30.9 pg (ref 26.6–33.0)
MCHC: 33.2 g/dL (ref 31.5–35.7)
MCV: 93 fL (ref 79–97)
Monocytes Absolute: 0.6 10*3/uL (ref 0.1–0.9)
Monocytes: 8 %
Neutrophils Absolute: 5.1 10*3/uL (ref 1.4–7.0)
Neutrophils: 68 %
Platelets: 162 10*3/uL (ref 150–450)
RBC: 4.57 x10E6/uL (ref 4.14–5.80)
RDW: 12.9 % (ref 11.6–15.4)
WBC: 7.5 10*3/uL (ref 3.4–10.8)

## 2022-08-10 LAB — COMPREHENSIVE METABOLIC PANEL
ALT: 15 IU/L (ref 0–44)
AST: 17 IU/L (ref 0–40)
Albumin/Globulin Ratio: 1.9 (ref 1.2–2.2)
Albumin: 4.3 g/dL (ref 3.8–4.8)
Alkaline Phosphatase: 52 IU/L (ref 44–121)
BUN/Creatinine Ratio: 11 (ref 10–24)
BUN: 13 mg/dL (ref 8–27)
Bilirubin Total: 0.5 mg/dL (ref 0.0–1.2)
CO2: 21 mmol/L (ref 20–29)
Calcium: 9.4 mg/dL (ref 8.6–10.2)
Chloride: 100 mmol/L (ref 96–106)
Creatinine, Ser: 1.22 mg/dL (ref 0.76–1.27)
Globulin, Total: 2.3 g/dL (ref 1.5–4.5)
Glucose: 144 mg/dL — ABNORMAL HIGH (ref 70–99)
Potassium: 4.4 mmol/L (ref 3.5–5.2)
Sodium: 137 mmol/L (ref 134–144)
Total Protein: 6.6 g/dL (ref 6.0–8.5)
eGFR: 63 mL/min/{1.73_m2} (ref >59)

## 2022-08-10 LAB — LIPID PANEL
Chol/HDL Ratio: 5.1 ratio — ABNORMAL HIGH (ref 0.0–5.0)
Cholesterol, Total: 148 mg/dL (ref 100–199)
HDL: 29 mg/dL — ABNORMAL LOW (ref >39)
LDL Chol Calc (NIH): 90 mg/dL (ref 0–99)
Triglycerides: 167 mg/dL — ABNORMAL HIGH (ref 0–149)
VLDL Cholesterol Cal: 29 mg/dL (ref 5–40)

## 2022-08-10 LAB — PSA: Prostate Specific Ag, Serum: 1 ng/mL (ref 0.0–4.0)

## 2022-08-10 NOTE — Progress Notes (Signed)
Improved urine micro- repeat in 1 year

## 2022-08-10 NOTE — Progress Notes (Signed)
Blood chemistry shows elevated blood glucose. Cholesterol is elevated from previous sample. Stable cell count. Stable PSA. Urine pending.  Stroke and heart attack risk can be reduced with heart healthy diet, exercise, smoking cessation and use of cholesterol medication increase from lipitor 10 to crestor 40 mg.   The 10-year ASCVD risk score (Arnett DK, et al., 2019) is: 42.6%   Values used to calculate the score:     Age: 72 years     Sex: Male     Is Non-Hispanic African American: No     Diabetic: Yes     Tobacco smoker: Yes     Systolic Blood Pressure: 129 mmHg     Is BP treated: No     HDL Cholesterol: 29 mg/dL     Total Cholesterol: 148 mg/dL

## 2022-08-25 ENCOUNTER — Ambulatory Visit (INDEPENDENT_AMBULATORY_CARE_PROVIDER_SITE_OTHER): Payer: 59 | Admitting: Family Medicine

## 2022-08-25 DIAGNOSIS — Z794 Long term (current) use of insulin: Secondary | ICD-10-CM | POA: Diagnosis not present

## 2022-08-25 DIAGNOSIS — E1140 Type 2 diabetes mellitus with diabetic neuropathy, unspecified: Secondary | ICD-10-CM

## 2022-08-25 DIAGNOSIS — F4310 Post-traumatic stress disorder, unspecified: Secondary | ICD-10-CM

## 2022-08-25 DIAGNOSIS — F410 Panic disorder [episodic paroxysmal anxiety] without agoraphobia: Secondary | ICD-10-CM

## 2022-08-25 NOTE — Progress Notes (Signed)
Angelena Sole, Pharm D assisted patient with application of sensor in presence of CMA, Vanessa Vital. Patient reported that he'll call if he has any questions or concerns. He also noted that he is taking the full pill of diazepam and that has been helpful for his PTSD. No PE was completed by provided.  Jacky Kindle, FNP  [facility] [address] [place], [place] [address] [phone] (phone) [phone] (fax) [facility]

## 2022-09-02 DIAGNOSIS — E1165 Type 2 diabetes mellitus with hyperglycemia: Secondary | ICD-10-CM | POA: Diagnosis not present

## 2022-10-02 ENCOUNTER — Ambulatory Visit: Payer: Self-pay

## 2022-10-02 NOTE — Patient Outreach (Signed)
  Care Coordination   Initial Visit Note   10/02/2022 Name: Joseph Luna MRN: [id number] DOB: 29-Nov-1950  Joseph Luna is a 72 y.o. year old male who sees Joseph Kindle, FNP for primary care. I spoke with  Joseph Luna by phone today.  What matters to the patients health and wellness today?  CM educated patient on [facility] services.  Patient declines services and feels that he is able to manage his medical needs.  Patient agreed to contact his provider in the future if he needs [facility] services.    Goals Addressed   None     SDOH assessments and interventions completed:  No     Care Coordination Interventions:  No, not indicated   Follow up plan: No further intervention required.   Encounter Outcome:  Pt. Refused

## 2022-10-03 DIAGNOSIS — E1165 Type 2 diabetes mellitus with hyperglycemia: Secondary | ICD-10-CM | POA: Diagnosis not present

## 2022-12-12 ENCOUNTER — Other Ambulatory Visit: Payer: Self-pay | Admitting: Family Medicine

## 2022-12-12 DIAGNOSIS — E1111 Type 2 diabetes mellitus with ketoacidosis with coma: Secondary | ICD-10-CM

## 2022-12-14 NOTE — Telephone Encounter (Signed)
Requested Prescriptions  Pending Prescriptions Disp Refills   atorvastatin (LIPITOR) 10 MG tablet [Pharmacy Med Name: Atorvastatin Calcium 10 MG Oral Tablet] 100 tablet 1    Sig: TAKE 1 TABLET BY MOUTH DAILY     Cardiovascular:  Antilipid - Statins Failed - 12/12/2022 10:49 PM      Failed - Lipid Panel in normal range within the last 12 months    Cholesterol, Total  Date Value Ref Range Status  08/09/2022 148 100 - 199 mg/dL Final   LDL Chol Calc (NIH)  Date Value Ref Range Status  08/09/2022 90 0 - 99 mg/dL Final   HDL  Date Value Ref Range Status  08/09/2022 29 (L) >39 mg/dL Final   Triglycerides  Date Value Ref Range Status  08/09/2022 167 (H) 0 - 149 mg/dL Final         Passed - Patient is not pregnant      Passed - Valid encounter within last 12 months    Recent Outpatient Visits           4 months ago No-show for appointment   [facility] Jacky Kindle, FNP   4 months ago Encounter for subsequent annual wellness visit (AWV) in Medicare patient   [facility] Merita Norton T, FNP   10 months ago Type 2 diabetes mellitus with diabetic neuropathy, with long-term current use of insulin [facility])   [place] [facility] Merita Norton T, FNP   1 year ago Type 2 diabetes mellitus with diabetic neuropathy, with long-term current use of insulin [facility])   [place] [facility] Merita Norton T, FNP   2 years ago Hyperlipidemia associated with type 2 diabetes mellitus [facility])   [facility] Chrismon, Jodell Cipro, PA-C       Future Appointments             In 2 months Suzie Portela, Daryl Eastern, FNP [facility]

## 2023-01-05 ENCOUNTER — Other Ambulatory Visit: Payer: Self-pay | Admitting: Family Medicine

## 2023-01-05 DIAGNOSIS — F4310 Post-traumatic stress disorder, unspecified: Secondary | ICD-10-CM

## 2023-01-05 DIAGNOSIS — F410 Panic disorder [episodic paroxysmal anxiety] without agoraphobia: Secondary | ICD-10-CM

## 2023-01-08 ENCOUNTER — Telehealth: Payer: Self-pay | Admitting: Family Medicine

## 2023-01-08 NOTE — Telephone Encounter (Signed)
Requested medication (s) are due for refill today: yes  Requested medication (s) are on the active medication list: yes  Last refill:  08/09/22 #180  Future visit scheduled: yes  Notes to clinic:  med not delegated to NT to RF   Requested Prescriptions  Pending Prescriptions Disp Refills   diazepam (VALIUM) 5 MG tablet [Pharmacy Med Name: DIAZEPAM  5MG   TAB] 180 tablet     Sig: TAKE 1 TABLET BY MOUTH EVERY 12  HOURS AS NEEDED FOR ANXIETY  (PTSD)     Not Delegated - Psychiatry: Anxiolytics/Hypnotics 2 Failed - 01/05/2023  9:58 AM      Failed - This refill cannot be delegated      Failed - Urine Drug Screen completed in last 360 days      Passed - Patient is not pregnant      Passed - Valid encounter within last 6 months    Recent Outpatient Visits           5 months ago No-show for appointment   [facility] Jacky Kindle, FNP   5 months ago Encounter for subsequent annual wellness visit (AWV) in Medicare patient   [facility] Merita Norton T, FNP   11 months ago Type 2 diabetes mellitus with diabetic neuropathy, with long-term current use of insulin [facility])   [facility] Merita Norton T, FNP   1 year ago Type 2 diabetes mellitus with diabetic neuropathy, with long-term current use of insulin [facility])   [facility] Merita Norton T, FNP   2 years ago Hyperlipidemia associated with type 2 diabetes mellitus [facility])   [facility] Chrismon, Jodell Cipro, PA-C       Future Appointments             In 1 month Suzie Portela, Daryl Eastern, FNP [facility], [facility]

## 2023-01-08 NOTE — Telephone Encounter (Signed)
Patient called to f/u on the script for diazepam (VALIUM) 5 MG tablet and he says he is out of the meds and need them to keep from having a panic attack. Please f/u with patient

## 2023-01-09 ENCOUNTER — Telehealth: Payer: Self-pay | Admitting: Family Medicine

## 2023-01-09 DIAGNOSIS — F4310 Post-traumatic stress disorder, unspecified: Secondary | ICD-10-CM

## 2023-01-09 NOTE — Telephone Encounter (Signed)
Pt states should be fine with waiting til mail order

## 2023-01-09 NOTE — Telephone Encounter (Signed)
Medication Refill - Medication: diazepam (VALIUM) 5 MG tablet   Has the patient contacted their pharmacy? Yes.    Preferred Pharmacy (with phone number or street name):  [facility] - [place] - [address] Phone: [phone]  Fax: [phone]     Has the patient been seen for an appointment in the last year OR does the patient have an upcoming appointment? Yes.    Agent: Please be advised that RX refills may take up to 3 business days. We ask that you follow-up with your pharmacy.

## 2023-01-09 NOTE — Telephone Encounter (Signed)
Already sent in today in a separate refill encounter.

## 2023-01-30 ENCOUNTER — Other Ambulatory Visit: Payer: Self-pay | Admitting: Family Medicine

## 2023-01-30 DIAGNOSIS — E1140 Type 2 diabetes mellitus with diabetic neuropathy, unspecified: Secondary | ICD-10-CM

## 2023-01-31 NOTE — Telephone Encounter (Signed)
Requested Prescriptions  Pending Prescriptions Disp Refills   gabapentin (NEURONTIN) 600 MG tablet [Pharmacy Med Name: Gabapentin 600 MG Oral Tablet] 300 tablet 0    Sig: TAKE 1 TABLET BY MOUTH 3 TIMES  DAILY     Neurology: Anticonvulsants - gabapentin Passed - 01/30/2023 10:17 PM      Passed - Cr in normal range and within 360 days    Creatinine  Date Value Ref Range Status  05/04/2012 0.97 0.60 - 1.30 mg/dL Final   Creatinine, Ser  Date Value Ref Range Status  08/09/2022 1.22 0.76 - 1.27 mg/dL Final         Passed - Completed PHQ-2 or PHQ-9 in the last 360 days      Passed - Valid encounter within last 12 months    Recent Outpatient Visits           5 months ago No-show for appointment   [facility] Jacky Kindle, FNP   5 months ago Encounter for subsequent annual wellness visit (AWV) in Medicare patient   [facility] Merita Norton T, FNP   1 year ago Type 2 diabetes mellitus with diabetic neuropathy, with long-term current use of insulin [facility])   [facility] Merita Norton T, FNP   1 year ago Type 2 diabetes mellitus with diabetic neuropathy, with long-term current use of insulin [facility])   [facility] Merita Norton T, FNP   2 years ago Hyperlipidemia associated with type 2 diabetes mellitus [facility])   [facility] Chrismon, Jodell Cipro, PA-C       Future Appointments             In 1 week Jacky Kindle, FNP [facility]

## 2023-02-08 DIAGNOSIS — E0590 Thyrotoxicosis, unspecified without thyrotoxic crisis or storm: Secondary | ICD-10-CM | POA: Diagnosis not present

## 2023-02-08 DIAGNOSIS — E1165 Type 2 diabetes mellitus with hyperglycemia: Secondary | ICD-10-CM | POA: Diagnosis not present

## 2023-02-08 DIAGNOSIS — Z794 Long term (current) use of insulin: Secondary | ICD-10-CM | POA: Diagnosis not present

## 2023-02-08 LAB — HEMOGLOBIN A1C: Hemoglobin A1C: 7.8

## 2023-02-09 ENCOUNTER — Ambulatory Visit: Payer: 59 | Admitting: Family Medicine

## 2023-02-12 ENCOUNTER — Ambulatory Visit (INDEPENDENT_AMBULATORY_CARE_PROVIDER_SITE_OTHER): Payer: 59 | Admitting: Family Medicine

## 2023-02-12 ENCOUNTER — Encounter: Payer: Self-pay | Admitting: Family Medicine

## 2023-02-12 VITALS — BP 121/57 | HR 88 | Resp 18 | Ht 73.0 in | Wt 228.5 lb

## 2023-02-12 DIAGNOSIS — Z794 Long term (current) use of insulin: Secondary | ICD-10-CM | POA: Diagnosis not present

## 2023-02-12 DIAGNOSIS — Z23 Encounter for immunization: Secondary | ICD-10-CM | POA: Insufficient documentation

## 2023-02-12 DIAGNOSIS — F17200 Nicotine dependence, unspecified, uncomplicated: Secondary | ICD-10-CM | POA: Diagnosis not present

## 2023-02-12 DIAGNOSIS — F410 Panic disorder [episodic paroxysmal anxiety] without agoraphobia: Secondary | ICD-10-CM

## 2023-02-12 DIAGNOSIS — F4310 Post-traumatic stress disorder, unspecified: Secondary | ICD-10-CM

## 2023-02-12 DIAGNOSIS — E1140 Type 2 diabetes mellitus with diabetic neuropathy, unspecified: Secondary | ICD-10-CM | POA: Diagnosis not present

## 2023-02-12 MED ORDER — OXYCODONE HCL 5 MG PO TABS: 5.0000 mg | ORAL_TABLET | Freq: Four times a day (QID) | ORAL | 0 refills | Status: DC | PRN

## 2023-02-12 NOTE — Progress Notes (Unsigned)
Established patient visit   Patient: Joseph Luna   DOB: [birth year]   72 y.o. Male  MRN: [id number] Visit Date: 02/12/2023  Today's healthcare provider: Jacky Kindle, FNP  Introduced to nurse practitioner role and practice setting.  All questions answered.  Discussed provider/patient relationship and expectations.  Subjective    HPI HPI     Medical Management of Chronic Issues    Additional comments: He is having neuropathy pain, gabepentin no longer working for him. Wants flu and pneumonia (if needed)  shots today.       Last edited by Jama Flavors, CMA on 02/12/2023 10:26 AM.     The patient, with a history of diabetes and neuropathy, presents with worsening neuropathy symptoms. They report that their current medication, gabapentin, is no longer effective in managing their pain. The patient has tried oxycodone, which they report was more effective in managing their pain. They are seeking a stronger medication for their neuropathy.  In addition to their neuropathy, the patient discusses their diabetes management. They report recent weight loss and changes in their insulin regimen due to fluctuating blood sugar levels. The patient also mentions an upcoming eye surgery, which they have been delaying due to logistical challenges.  The patient also reports that they have been managing their anxiety with diazepam, which has been effective in preventing panic attacks. They express satisfaction with this medication and do not report any side effects.  Medications: Outpatient Medications Prior to Visit  Medication Sig   acetaminophen (TYLENOL) 500 MG tablet Take 500 mg by mouth every 6 (six) hours as needed.   aspirin EC 81 MG EC tablet Take 1 tablet (81 mg total) by mouth daily.   atorvastatin (LIPITOR) 10 MG tablet TAKE 1 TABLET BY MOUTH DAILY   diazepam (VALIUM) 5 MG tablet TAKE 1 TABLET BY MOUTH EVERY 12  HOURS AS NEEDED FOR ANXIETY  (PTSD)   gabapentin (NEURONTIN) 600 MG  tablet TAKE 1 TABLET BY MOUTH 3 TIMES  DAILY   insulin isophane & regular human KwikPen (NOVOLIN 70/30 KWIKPEN) (70-30) 100 UNIT/ML KwikPen Inject 27 Units into the skin 2 (two) times daily with a meal.   methimazole (TAPAZOLE) 10 MG tablet Take 10 mg by mouth daily. Two tablets daily on M,W,F and 1 tablets all other days.   No facility-administered medications prior to visit.     Objective    BP (!) 121/57   Pulse 88   Resp 18   Ht 6\' 1"  (1.854 m)   Wt 228 lb 8 oz (103.6 kg)   SpO2 100%   BMI 30.15 kg/m   Physical Exam Vitals and nursing note reviewed.  Constitutional:      Appearance: Normal appearance. He is obese.  HENT:     Head: Normocephalic and atraumatic.  Cardiovascular:     Rate and Rhythm: Normal rate and regular rhythm.     Pulses: Normal pulses.     Heart sounds: Normal heart sounds.  Pulmonary:     Effort: Pulmonary effort is normal.     Breath sounds: Normal breath sounds.  Musculoskeletal:        General: Normal range of motion.     Cervical back: Normal range of motion.  Skin:    General: Skin is warm and dry.     Capillary Refill: Capillary refill takes less than 2 seconds.  Neurological:     General: No focal deficit present.     Mental Status: He is alert  and oriented to person, place, and time. Mental status is at baseline.  Psychiatric:        Mood and Affect: Mood normal.        Behavior: Behavior normal.        Thought Content: Thought content normal.        Judgment: Judgment normal.     No results found for any visits on 02/12/23.  Assessment & Plan     Problem List Items Addressed This Visit   None Peripheral Neuropathy Increased pain despite gabapentin 600mg . Discussed tolerance to medications and potential options including oxycodone and Lyrica. Patient has previously tried oxycodone with good effect. -Start low dose opioid and monitor for efficacy and side effects. -Consider pain referral if not improved  Diabetes  Mellitus Recent increase in A1C to 7.8 from 7.4. Patient reports variable blood sugars with some hypoglycemic episodes. Currently on Humulin 70/30 with variable dosing based on blood sugar readings. -Continue current insulin regimen and monitor blood sugars closely. -Discussed importance of consistent diet and meal timing to prevent hypoglycemic episodes.  Anxiety Reports good control of panic attacks with diazepam 1 tablet as needed. -Continue current regimen. -RP and PT are aware of risks of opioid medication use to include increased sedation, respiratory suppression, falls, dependence and cardiovascular events.  RP and PT would like to continue treatment as benefit determined to outweigh risk.     General Health Maintenance -Administer influenza vaccine today and need for subsequent PNA vaccine -Discussed potential need for shingles vaccine, to be obtained at pharmacy.   Leilani Merl, FNP, have reviewed all documentation for this visit. The documentation on 02/12/23 for the exam, diagnosis, procedures, and orders are all accurate and complete.  Jacky Kindle, FNP  [facility] [phone] (phone) [phone] (fax)  [facility]

## 2023-02-15 DIAGNOSIS — Z23 Encounter for immunization: Secondary | ICD-10-CM | POA: Insufficient documentation

## 2023-02-15 DIAGNOSIS — F17200 Nicotine dependence, unspecified, uncomplicated: Secondary | ICD-10-CM | POA: Insufficient documentation

## 2023-02-21 ENCOUNTER — Telehealth: Payer: Self-pay

## 2023-02-21 ENCOUNTER — Telehealth: Payer: Self-pay | Admitting: Family Medicine

## 2023-02-21 NOTE — Telephone Encounter (Signed)
Pt asked how many tablets were sent in for medication oxyCODONE (OXY IR/ROXICODONE) 5 MG immediate release tablet advised 120 tablets on 02/12/2023. Pt stated he was going to reach out to the pharmacy to ask why they only shipped 28 tablets.  Please advise.

## 2023-02-21 NOTE — Telephone Encounter (Signed)
Duplicate

## 2023-02-21 NOTE — Telephone Encounter (Signed)
Copied from CRM [id number]. Topic: General - Other >> Feb 21, 2023  1:58 PM Dondra Prader E wrote: Reason for CRM: Pt called reporting that he only received 28 tablets of his oxycodone from his pharmacy even though the order states he was supposed  to have received 120 tablets.   Nurse call back request

## 2023-02-21 NOTE — Telephone Encounter (Signed)
2nd Request ..Pt called reporting that he only received 28 tablets of his oxycodone from his pharmacy even though the order states he was supposed  to have received 120 tablets. Patient was instructed to to contact his insurance company also, there could also be a limit on how many patient can receive ,where the insurance will cover..Patient refused .[name] KitchenMarland KitchenDr. Roxan Hockey can you look into this matter please. Robynn Pane is off on 02/22/23.  Patient is very adamant about someone in clinical calling him back .[name] KitchenIf not patient stated that he will blow our phone line up by calling several times. Per Patient.

## 2023-02-22 NOTE — Telephone Encounter (Signed)
[facility] correspondence has been reviewed.   Pt's insurance plan states that oxycodone 5mg  every 6 hours is a new opioid prescription and his insurance plan requires that he be given the shortest duration possible (1 week's supply) of this medication which is why he was not given the 120 tabs.   Please advise patient to contact insurance company to discuss further and follow up with PCP once he has been advised on next steps.   Joseph Ramp, MD

## 2023-02-23 NOTE — Telephone Encounter (Signed)
The patient called back after speaking with his insurance company and they informed him to call back his provider and that he could get another script for a 30 day supply.of the Oxycodone. Please assist patient further. The patient would like a call back as soon as possible in regards to this

## 2023-02-26 ENCOUNTER — Other Ambulatory Visit: Payer: Self-pay | Admitting: Family Medicine

## 2023-02-26 ENCOUNTER — Telehealth: Payer: Self-pay

## 2023-02-26 MED ORDER — OXYCODONE HCL 5 MG PO TABS: 5.0000 mg | ORAL_TABLET | Freq: Four times a day (QID) | ORAL | 0 refills | Status: AC | PRN

## 2023-02-26 NOTE — Telephone Encounter (Signed)
Rx for oxycodone (3 weeks for remainder of initial prescription) sent to [facility] pharmacy

## 2023-02-26 NOTE — Telephone Encounter (Signed)
Copied from CRM [id number]. Topic: General - Inquiry >> Feb 26, 2023 10:25 AM Patsy Lager T wrote: Reason for CRM: Patient called stated he needs the provider or nurse to call him back but he did not give a reason for the call back. Please f/u with patient

## 2023-02-26 NOTE — Telephone Encounter (Signed)
RX sent. Please schedule pt to follow up with PCP in Dec for discussion on chronic pain management

## 2023-02-26 NOTE — Telephone Encounter (Signed)
Results have been relayed to the patient/authorized caretaker. The patient/authorized caretaker verbalized understanding. No questions at this time.  Appt has been made for December.

## 2023-02-26 NOTE — Telephone Encounter (Signed)
Patient calling back if prescription can be send in to [facility] for Oxycodone. Reports that he only received 28 tablets enough for a week. Asking if another prescription can be send in please. See other telephone encounters about the same request.

## 2023-02-27 ENCOUNTER — Telehealth: Payer: Self-pay

## 2023-02-27 NOTE — Telephone Encounter (Signed)
Copied from CRM [id number]. Topic: General - Other >> Feb 27, 2023  7:53 AM Everette C wrote: Reason for CRM: The patient has been directed to contact their PCP and request completion of prior authorization   Please call [phone] when available to complete prior authorization of the patient's oxyCODONE (OXY IR/ROXICODONE) 5 MG immediate release tablet [[id number]]   Please contact the patient further if needed

## 2023-03-02 NOTE — Telephone Encounter (Signed)
Form was given to provider to fax to plan.

## 2023-03-10 ENCOUNTER — Other Ambulatory Visit: Payer: Self-pay | Admitting: Family Medicine

## 2023-03-10 DIAGNOSIS — F410 Panic disorder [episodic paroxysmal anxiety] without agoraphobia: Secondary | ICD-10-CM

## 2023-03-10 DIAGNOSIS — F4310 Post-traumatic stress disorder, unspecified: Secondary | ICD-10-CM

## 2023-03-12 NOTE — Telephone Encounter (Signed)
Please advise 

## 2023-03-26 ENCOUNTER — Ambulatory Visit (INDEPENDENT_AMBULATORY_CARE_PROVIDER_SITE_OTHER): Payer: 59 | Admitting: Family Medicine

## 2023-03-26 ENCOUNTER — Encounter: Payer: Self-pay | Admitting: Family Medicine

## 2023-03-26 VITALS — BP 124/66 | HR 86 | Ht 73.0 in | Wt 203.0 lb

## 2023-03-26 DIAGNOSIS — I420 Dilated cardiomyopathy: Secondary | ICD-10-CM | POA: Diagnosis not present

## 2023-03-26 DIAGNOSIS — F410 Panic disorder [episodic paroxysmal anxiety] without agoraphobia: Secondary | ICD-10-CM | POA: Diagnosis not present

## 2023-03-26 DIAGNOSIS — E1140 Type 2 diabetes mellitus with diabetic neuropathy, unspecified: Secondary | ICD-10-CM

## 2023-03-26 DIAGNOSIS — Z794 Long term (current) use of insulin: Secondary | ICD-10-CM

## 2023-03-26 DIAGNOSIS — F4310 Post-traumatic stress disorder, unspecified: Secondary | ICD-10-CM

## 2023-03-26 MED ORDER — OXYCODONE HCL 5 MG PO TABS: 5.0000 mg | ORAL_TABLET | Freq: Three times a day (TID) | ORAL | 0 refills | Status: DC | PRN

## 2023-03-26 NOTE — Assessment & Plan Note (Signed)
Chronic DM with improved neuropathy Wishes to continue on low dose Oxy to further assist chronic pain Pt is aware of risks of psychoactive medication use to include increased sedation, respiratory suppression, falls, extrapyramidal movements,  dependence and cardiovascular events.  Pt would like to continue treatment as benefit determined to outweigh risk.

## 2023-03-26 NOTE — Assessment & Plan Note (Signed)
Chronic, stable Followed by Joseph Luna Reports hx of Aflutter and "skipped beats" HR remains stable at this time as does BP Defer medication changes

## 2023-03-26 NOTE — Progress Notes (Signed)
Established patient visit   Patient: Joseph Luna   DOB: 07-Oct-1950   72 y.o. Male  MRN: [id number] Visit Date: 03/26/2023  Today's healthcare provider: Jacky Kindle, FNP  Re Introduced to nurse practitioner role and practice setting.  All questions answered.  Discussed provider/patient relationship and expectations.  Subjective    HPI HPI   Pt stated--requesting oxycodone refill. Last edited by Shelly Bombard, CMA on 03/26/2023 10:39 AM.     Patient has been started on low dose Oxy to assist neuropathic pain following failure of gabapentin. Pt reports great improvement in pain to the point he has been able to walk 6-9 blocks/day and be able to reduce his weight, successfully.  Medications: Outpatient Medications Prior to Visit  Medication Sig   acetaminophen (TYLENOL) 500 MG tablet Take 500 mg by mouth every 6 (six) hours as needed.   aspirin EC 81 MG EC tablet Take 1 tablet (81 mg total) by mouth daily.   atorvastatin (LIPITOR) 10 MG tablet TAKE 1 TABLET BY MOUTH DAILY   diazepam (VALIUM) 5 MG tablet TAKE 1 TABLET BY MOUTH EVERY 12  HOURS AS NEEDED FOR ANXIETY /  PTSD   insulin isophane & regular human KwikPen (NOVOLIN 70/30 KWIKPEN) (70-30) 100 UNIT/ML KwikPen Inject 27 Units into the skin 2 (two) times daily with a meal.   methimazole (TAPAZOLE) 10 MG tablet Take 10 mg by mouth daily. Two tablets daily on M,W,F and 1 tablets all other days.   gabapentin (NEURONTIN) 600 MG tablet TAKE 1 TABLET BY MOUTH 3 TIMES  DAILY (Patient not taking: Reported on 03/26/2023)   No facility-administered medications prior to visit.   Last CBC Lab Results  Component Value Date   WBC 7.5 08/09/2022   HGB 14.1 08/09/2022   HCT 42.5 08/09/2022   MCV 93 08/09/2022   MCH 30.9 08/09/2022   RDW 12.9 08/09/2022   PLT 162 08/09/2022   Last metabolic panel Lab Results  Component Value Date   GLUCOSE 144 (H) 08/09/2022   NA 137 08/09/2022   K 4.4 08/09/2022   CL 100 08/09/2022   CO2 21  08/09/2022   BUN 13 08/09/2022   CREATININE 1.22 08/09/2022   EGFR 63 08/09/2022   CALCIUM 9.4 08/09/2022   PHOS 4.0 06/19/2018   PROT 6.6 08/09/2022   ALBUMIN 4.3 08/09/2022   LABGLOB 2.3 08/09/2022   AGRATIO 1.9 08/09/2022   BILITOT 0.5 08/09/2022   ALKPHOS 52 08/09/2022   AST 17 08/09/2022   ALT 15 08/09/2022   ANIONGAP 8 06/19/2018   Last lipids Lab Results  Component Value Date   CHOL 148 08/09/2022   HDL 29 (L) 08/09/2022   LDLCALC 90 08/09/2022   TRIG 167 (H) 08/09/2022   CHOLHDL 5.1 (H) 08/09/2022   Last hemoglobin A1c Lab Results  Component Value Date   HGBA1C 7.8 02/08/2023   Last thyroid functions Lab Results  Component Value Date   TSH 2.490 01/19/2022   T4TOTAL 7.3 12/06/2020   Last vitamin B12 and Folate Lab Results  Component Value Date   VITAMINB12 358 06/07/2020       Objective    BP 124/66 (BP Location: Right Arm, Patient Position: Sitting, Cuff Size: Normal)   Pulse 86   Ht 6\' 1"  (1.854 m)   Wt 203 lb (92.1 kg)   SpO2 99%   BMI 26.78 kg/m   BP Readings from Last 3 Encounters:  03/26/23 124/66  02/12/23 (!) 121/57  08/09/22  129/74   Wt Readings from Last 3 Encounters:  03/26/23 203 lb (92.1 kg)  02/12/23 228 lb 8 oz (103.6 kg)  08/09/22 225 lb (102.1 kg)   SpO2 Readings from Last 3 Encounters:  03/26/23 99%  02/12/23 100%  08/09/22 100%      Physical Exam Vitals and nursing note reviewed.  Constitutional:      Appearance: Normal appearance. He is overweight.  HENT:     Head: Normocephalic and atraumatic.  Eyes:     Pupils: Pupils are equal, round, and reactive to light.  Cardiovascular:     Rate and Rhythm: Normal rate and regular rhythm.     Pulses: Normal pulses.     Heart sounds: Normal heart sounds.  Pulmonary:     Effort: Pulmonary effort is normal.     Breath sounds: Normal breath sounds.  Musculoskeletal:        General: Normal range of motion.     Cervical back: Normal range of motion.  Skin:    General:  Skin is warm and dry.     Capillary Refill: Capillary refill takes less than 2 seconds.  Neurological:     General: No focal deficit present.     Mental Status: He is alert and oriented to person, place, and time. Mental status is at baseline.     No results found for any visits on 03/26/23.  Assessment & Plan     Problem List Items Addressed This Visit       Cardiovascular and Mediastinum   Dilated cardiomyopathy (HCC)    Chronic, stable Followed by Mariah Milling Reports hx of Aflutter and "skipped beats" HR remains stable at this time as does BP Defer medication changes        Endocrine   Type 2 diabetes mellitus with diabetic neuropathy (HCC)    Chronic DM with improved neuropathy Wishes to continue on low dose Oxy to further assist chronic pain Pt is aware of risks of psychoactive medication use to include increased sedation, respiratory suppression, falls, extrapyramidal movements,  dependence and cardiovascular events.  Pt would like to continue treatment as benefit determined to outweigh risk.           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     Leilani Merl, FNP, have reviewed all documentation for this visit. The documentation on 03/26/23 for the exam, diagnosis, procedures, and orders are all accurate and complete.  Jacky Kindle, FNP  [facility] [phone] (phone) [phone] (fax)  [facility]

## 2023-03-26 NOTE — Assessment & Plan Note (Signed)
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

## 2023-03-28 ENCOUNTER — Telehealth: Payer: Self-pay

## 2023-03-28 NOTE — Telephone Encounter (Signed)
Copied from CRM [id number]. Topic: General - Other >> Mar 28, 2023 11:49 AM Macon Large wrote: Reason for CRM: Pt reports that [facility] informed him that an appeal needs to be filed for the diazepam (VALIUM) 5 MG tablet. Pt requests call back ith an update once the appeal is filed. Cb# [phone]

## 2023-04-02 ENCOUNTER — Telehealth: Payer: Self-pay | Admitting: Family Medicine

## 2023-04-02 NOTE — Telephone Encounter (Signed)
Pt is calling in because he would like to know the status of his appeal for diazepam (VALIUM) 5 MG tablet [[id number]] , Pt is requesting someone call him with the status or send a message. Pt says he is out of it and really needs it for his anxiety. Pt says he is disappointed because he hasn't heard anything from anyone and he needs to hear something as soon as possible.

## 2023-04-03 ENCOUNTER — Other Ambulatory Visit: Payer: Self-pay | Admitting: Family Medicine

## 2023-04-03 DIAGNOSIS — F410 Panic disorder [episodic paroxysmal anxiety] without agoraphobia: Secondary | ICD-10-CM

## 2023-04-03 NOTE — Telephone Encounter (Unsigned)
Copied from CRM [id number]. Topic: General - Other >> Apr 03, 2023  9:18 AM Everette C wrote: Reason for CRM: Medication Refill - Most Recent Primary Care Visit:  Provider: Merita Norton T Department: [facility] Visit Type: OFFICE VISIT Date: 03/26/2023  Medication: diazepam (VALIUM) 5 MG tablet [[id number]]  Has the patient contacted their pharmacy? Yes. The patient has been directed to request a 30 day supply of the medication  (Agent: If no, request that the patient contact the pharmacy for the refill. If patient does not wish to contact the pharmacy document the reason why and proceed with request.) (Agent: If yes, when and what did the pharmacy advise?)  Is this the correct pharmacy for this prescription? Yes If no, delete pharmacy and type the correct one.  This is the patient's preferred pharmacy:  [facility] [place] [place] - [address] [place] [address] Phone: [phone] Fax: [phone]   Has the prescription been filled recently? Yes  Is the patient out of the medication? Yes  Has the patient been seen for an appointment in the last year OR does the patient have an upcoming appointment? Yes  Can we respond through MyChart? No  The patient would like to be contacted when possible to follow up on the medication   Agent: Please be advised that Rx refills may take up to 3 business days. We ask that you follow-up with your pharmacy.

## 2023-04-03 NOTE — Telephone Encounter (Signed)
Requested medication (s) are due for refill today: Yes  Requested medication (s) are on the active medication list: Yes  Last refill:  03/12/23  Future visit scheduled: Yes  Notes to clinic:  Not delegated.    Requested Prescriptions  Pending Prescriptions Disp Refills   diazepam (VALIUM) 5 MG tablet 180 tablet 0     Not Delegated - Psychiatry: Anxiolytics/Hypnotics 2 Failed - 04/03/2023  3:44 PM      Failed - This refill cannot be delegated      Failed - Urine Drug Screen completed in last 360 days      Passed - Patient is not pregnant      Passed - Valid encounter within last 6 months    Recent Outpatient Visits           1 week ago Panic attack due to post traumatic stress disorder (PTSD)   [facility] Merita Norton T, FNP   1 month ago Panic attack due to post traumatic stress disorder (PTSD)   [facility] Jacky Kindle, FNP   7 months ago No-show for appointment   [facility] Jacky Kindle, FNP   7 months ago Encounter for subsequent annual wellness visit (AWV) in Medicare patient   [facility] Merita Norton T, FNP   1 year ago Type 2 diabetes mellitus with diabetic neuropathy, with long-term current use of insulin [facility])   [facility] Jacky Kindle, FNP       Future Appointments             In 4 months Jacky Kindle, FNP [facility], [facility]

## 2023-04-04 NOTE — Telephone Encounter (Signed)
The patient called back stating he only got got a month supply of the diazepam and not the #180 that is in the original order because [facility] stated they can only do a month at a time because it is a controlled substance. If a month supply can be called in as soon as possible to [facility] as the patient has been out. He is very frustrated as he called in originally last week and has been waiting for a response from someone but still has not received one. Please notify patient as soon as possible when this is called in.,

## 2023-04-05 ENCOUNTER — Other Ambulatory Visit: Payer: Self-pay | Admitting: Family Medicine

## 2023-04-05 DIAGNOSIS — F4310 Post-traumatic stress disorder, unspecified: Secondary | ICD-10-CM

## 2023-04-05 MED ORDER — DIAZEPAM 5 MG PO TABS: 5.0000 mg | ORAL_TABLET | Freq: Two times a day (BID) | ORAL | 5 refills | Status: DC | PRN

## 2023-04-05 NOTE — Telephone Encounter (Signed)
Notified pt --Valium was sent to [facility] pharmacy.

## 2023-04-05 NOTE — Telephone Encounter (Signed)
Have you received an appeal on this patient? A form from [employer] was filled out last month or so and it says patient was trying to wean off Valium.  Please review

## 2023-04-06 NOTE — Telephone Encounter (Signed)
Created PA with key number [id number] and have sent demographic to [facility]. According to Cover my meds no PA needed for generic equivalent.

## 2023-04-10 ENCOUNTER — Other Ambulatory Visit: Payer: Self-pay | Admitting: Family Medicine

## 2023-04-10 DIAGNOSIS — E1140 Type 2 diabetes mellitus with diabetic neuropathy, unspecified: Secondary | ICD-10-CM

## 2023-04-17 NOTE — Telephone Encounter (Signed)
Diazepam was dispensed on 04/05/23

## 2023-04-23 ENCOUNTER — Other Ambulatory Visit: Payer: Self-pay | Admitting: Family Medicine

## 2023-04-23 NOTE — Telephone Encounter (Signed)
 Medication Refill -  Most Recent Primary Care Visit:  Provider: PAYNE, ELISE T  Department: [facility]  Visit Type: OFFICE VISIT  Date: 03/26/2023  Medication: oxyCODONE  (OXY IR/ROXICODONE ) 5 MG immediate release tablet   Has the patient contacted their pharmacy? yes  (Agent: If yes, when and what did the pharmacy advise?)contact pcp  Is this the correct pharmacy for this prescription? yes  This is the patient's preferred pharmacy:  [facility] - [place], [place] - [address] [place] [address] Phone: [phone] Fax: [phone]   Has the prescription been filled recently? no  Is the patient out of the medication: no has 3 left  Has the patient been seen for an appointment in the last year OR does the patient have an upcoming appointment? yes  Can we respond through MyChart? yes  Agent: Please be advised that Rx refills may take up to 3 business days. We ask that you follow-up with your pharmacy.

## 2023-04-24 NOTE — Telephone Encounter (Signed)
 Requested medication (s) are due for refill today - no  Requested medication (s) are on the active medication list -yes  Future visit scheduled -yes  Last refill: 03/26/23 #270  Notes to clinic: non delegated Rx  Requested Prescriptions  Pending Prescriptions Disp Refills   oxyCODONE  (OXY IR/ROXICODONE ) 5 MG immediate release tablet 270 tablet 0    Sig: Take 1 tablet (5 mg total) by mouth 3 (three) times daily as needed for severe pain (pain score 7-10) or moderate pain (pain score 4-6).     Not Delegated - Analgesics:  Opioid Agonists Failed - 04/24/2023 10:15 AM      Failed - This refill cannot be delegated      Failed - Urine Drug Screen completed in last 360 days      Passed - Valid encounter within last 3 months    Recent Outpatient Visits           4 weeks ago Panic attack due to post traumatic stress disorder (PTSD)   [place] [facility] Emilio Marseille T, FNP   2 months ago Panic attack due to post traumatic stress disorder (PTSD)   [place] [facility] Emilio Marseille DASEN, FNP   8 months ago No-show for appointment   [facility] Marseille T, FNP   8 months ago Encounter for subsequent annual wellness visit (AWV) in Medicare patient   [facility] Emilio Marseille T, FNP   1 year ago Type 2 diabetes mellitus with diabetic neuropathy, with long-term current use of insulin  [facility])   [place] [facility] Emilio Marseille T, FNP       Future Appointments             In 3 months Pardue, Lauraine SAILOR, DO [place] [facility], [facility]               Requested Prescriptions  Pending Prescriptions Disp Refills   oxyCODONE  (OXY IR/ROXICODONE ) 5 MG immediate release tablet 270 tablet 0    Sig: Take 1 tablet (5 mg total) by mouth 3 (three) times daily as needed for severe pain (pain score 7-10) or moderate pain (pain score 4-6).     Not Delegated - Analgesics:  Opioid  Agonists Failed - 04/24/2023 10:15 AM      Failed - This refill cannot be delegated      Failed - Urine Drug Screen completed in last 360 days      Passed - Valid encounter within last 3 months    Recent Outpatient Visits           4 weeks ago Panic attack due to post traumatic stress disorder (PTSD)   [facility] Emilio Marseille T, FNP   2 months ago Panic attack due to post traumatic stress disorder (PTSD)   [place] [facility] Emilio Marseille DASEN, FNP   8 months ago No-show for appointment   [facility] Emilio Marseille DASEN, FNP   8 months ago Encounter for subsequent annual wellness visit (AWV) in Medicare patient   [facility] Emilio Marseille T, FNP   1 year ago Type 2 diabetes mellitus with diabetic neuropathy, with long-term current use of insulin  [facility])   [facility] Emilio Marseille DASEN, FNP       Future Appointments             In 3 months  Donzella Lauraine SAILOR, DO [place] [facility], [facility]

## 2023-04-27 ENCOUNTER — Telehealth: Payer: Self-pay

## 2023-04-27 ENCOUNTER — Telehealth: Payer: Self-pay | Admitting: Family Medicine

## 2023-04-27 DIAGNOSIS — E1140 Type 2 diabetes mellitus with diabetic neuropathy, unspecified: Secondary | ICD-10-CM

## 2023-04-27 MED ORDER — OXYCODONE HCL 5 MG PO TABS: 5.0000 mg | ORAL_TABLET | Freq: Three times a day (TID) | ORAL | 0 refills | Status: DC | PRN

## 2023-04-27 MED ORDER — OXYCODONE HCL 5 MG PO TABS: 5.0000 mg | ORAL_TABLET | ORAL | 0 refills | Status: DC | PRN

## 2023-04-27 NOTE — Telephone Encounter (Signed)
 Copied from CRM [id number]. Topic: Clinical - Medication Refill >> Apr 27, 2023  9:10 AM Lucienne HERO wrote: Reason for CRM:  patient spoke to [facility] nurse triage on 1/6 regarding needing medication refill for his chronic pain. He was seeing Kelly NP who is no longer with practice. His  oxyCODONE  (OXY IR/ROXICODONE ) 5 MG immediate release tablet was written for 270 pills but only gave 90 the last time by the pharmacy they do not cover 270 of that med. Patient needs refill placed today if possible he is in a panic about only being down to 3 pills currently.

## 2023-04-27 NOTE — Telephone Encounter (Signed)
 Copied from CRM [id number]. Topic: General - Other >> Apr 27, 2023  9:14 AM Selinda RAMAN wrote: Reason for CRM: The patient called in stating he would appreciate some type of contact back regarding his refill request he sent in on Monday. He states he rarely hears back and believes the only good business is through direct correspondence. He is extremely low on his medicine and has been only getting #90 at a t time from the pharmacy and states he has to go through this all the time.

## 2023-04-27 NOTE — Addendum Note (Signed)
 Addended by: Jacquenette Shone on: 04/27/2023 11:31 PM   Modules accepted: Orders

## 2023-05-01 ENCOUNTER — Other Ambulatory Visit: Payer: Self-pay | Admitting: Family Medicine

## 2023-05-01 DIAGNOSIS — E1140 Type 2 diabetes mellitus with diabetic neuropathy, unspecified: Secondary | ICD-10-CM

## 2023-05-01 NOTE — Telephone Encounter (Signed)
 Medication Refill -  Most Recent Primary Care Visit:  Provider: PAYNE, ELISE T  Department: [facility]  Visit Type: OFFICE VISIT  Date: 03/26/2023  Medication: oxyCODONE  (OXY IR/ROXICODONE ) 5 MG immediate release tablet   Has the patient contacted their pharmacy? yes ((Agent: If yes, when and what did the pharmacy advise?)contact pcp, pt was told has to have a specific Dr's name for refill since Dr Emilio is gone  Is this the correct pharmacy for this prescription? yes  This is the patient's preferred pharmacy:  [facility] - [place], [place] - [address] [place] [address] Phone: [phone] Fax: [phone]   Has the prescription been filled recently? no  Is the patient out of the medication? No has 1 left  Has the patient been seen for an appointment in the last year OR does the patient have an upcoming appointment? yes  Can we respond through MyChart? yes  Agent: Please be advised that Rx refills may take up to 3 business days. We ask that you follow-up with your pharmacy.

## 2023-05-02 NOTE — Telephone Encounter (Signed)
 Requested medication (s) are due for refill today: No  Requested medication (s) are on the active medication list: Yes  Last refill:  04/27/23 #30  Future visit scheduled: Yes  Notes to clinic:  Unable to refill per protocol, cannot delegate. Looks like a duplicate request     Requested Prescriptions  Pending Prescriptions Disp Refills   oxyCODONE  (OXY IR/ROXICODONE ) 5 MG immediate release tablet 90 tablet 0    Sig: Take 1 tablet (5 mg total) by mouth 3 (three) times daily as needed for severe pain (pain score 7-10) or moderate pain (pain score 4-6).     Not Delegated - Analgesics:  Opioid Agonists Failed - 05/02/2023 11:24 AM      Failed - This refill cannot be delegated      Failed - Urine Drug Screen completed in last 360 days      Passed - Valid encounter within last 3 months    Recent Outpatient Visits           1 month ago Panic attack due to post traumatic stress disorder (PTSD)   [place] [facility] Iona Manis T, FNP   2 months ago Panic attack due to post traumatic stress disorder (PTSD)   [place] [facility] Normie Becton, FNP   8 months ago No-show for appointment   [facility] Normie Becton, FNP   8 months ago Encounter for subsequent annual wellness visit (AWV) in Medicare patient   [facility] Iona Manis T, FNP   1 year ago Type 2 diabetes mellitus with diabetic neuropathy, with long-term current use of insulin  [facility])   [facility] Normie Becton, FNP       Future Appointments             In 3 months Pardue, Asencion Blacksmith, DO [facility], [facility]

## 2023-05-15 ENCOUNTER — Telehealth: Payer: Self-pay | Admitting: Family Medicine

## 2023-05-15 DIAGNOSIS — E1111 Type 2 diabetes mellitus with ketoacidosis with coma: Secondary | ICD-10-CM

## 2023-05-15 MED ORDER — ATORVASTATIN CALCIUM 10 MG PO TABS: 10.0000 mg | ORAL_TABLET | Freq: Every day | ORAL | 0 refills | Status: DC

## 2023-05-15 NOTE — Telephone Encounter (Signed)
[facility] faxed refill request for the following medications:   atorvastatin (LIPITOR) 10 MG tablet     Please advise.

## 2023-05-30 LAB — HM DIABETES EYE EXAM

## 2023-06-26 ENCOUNTER — Other Ambulatory Visit: Payer: Self-pay | Admitting: Family Medicine

## 2023-06-26 DIAGNOSIS — E1140 Type 2 diabetes mellitus with diabetic neuropathy, unspecified: Secondary | ICD-10-CM

## 2023-06-26 NOTE — Telephone Encounter (Signed)
 Pt says if whoever calls this in has not ordered this before it will not get covered by his insurance. Please advise    Copied from CRM [id number]. Topic: Clinical - Medication Refill >> Jun 26, 2023  2:11 PM Franchot Heidelberg wrote: Most Recent Primary Care Visit:  Provider: Merita Norton T  [facility]  Visit Type: OFFICE VISIT  Date: 03/26/2023  Medication: oxyCODONE (OXY IR/ROXICODONE) 5 MG immediate release tablet  Has the patient contacted their pharmacy? Yes (Agent: If no, request that the patient contact the pharmacy for the refill. If patient does not wish to contact the pharmacy document the reason why and proceed with request.) (Agent: If yes, when and what did the pharmacy advise?)  Is this the correct pharmacy for this prescription? Yes If no, delete pharmacy and type the correct one.  This is the patient's preferred pharmacy:  [facility] - [place], [place] - [address] [place] [address] Phone: [phone] Fax: [phone]   Has the prescription been filled recently? Yes  Is the patient out of the medication? Yes  Has the patient been seen for an appointment in the last year OR does the patient have an upcoming appointment? Yes  Can we respond through MyChart? Yes  Agent: Please be advised that Rx refills may take up to 3 business days. We ask that you follow-up with your pharmacy.

## 2023-06-27 NOTE — Telephone Encounter (Signed)
 Requested medication (s) are due for refill today:   Provider to review  Requested medication (s) are on the active medication list:   Yes  Future visit scheduled:   Yes 4/28 with Dr. Payton Mccallum    LOV 03/26/2023   Last ordered: 04/27/2023 #90, 0 refills  Non delegated refill    Requested Prescriptions  Pending Prescriptions Disp Refills   oxyCODONE (OXY IR/ROXICODONE) 5 MG immediate release tablet 30 tablet 0    Sig: Take 1 tablet (5 mg total) by mouth every 4 (four) hours as needed for severe pain (pain score 7-10).     Not Delegated - Analgesics:  Opioid Agonists Failed - 06/27/2023 11:55 AM      Failed - This refill cannot be delegated      Failed - Urine Drug Screen completed in last 360 days      Failed - Valid encounter within last 3 months    Recent Outpatient Visits           3 months ago Panic attack due to post traumatic stress disorder (PTSD)   [facility] Merita Norton T, FNP   4 months ago Panic attack due to post traumatic stress disorder (PTSD)   [facility] Jacky Kindle, FNP   10 months ago No-show for appointment   [facility] Jacky Kindle, F[facility]   10 months ago Encounter for subsequent annual wellness visit (AWV) in Medicare patient   [facility] Merita Norton T, FNP   1 year ago Type 2 diabetes mellitus with diabetic neuropathy, with long-term current use of insulin [facility])   [place] [facility] Jacky Kindle, FNP       Future Appointments             In 1 month Pardue, Monico Blitz, DO [place] [facility], [facility]

## 2023-06-29 MED ORDER — OXYCODONE HCL 5 MG PO TABS: 5.0000 mg | ORAL_TABLET | Freq: Three times a day (TID) | ORAL | 0 refills | Status: DC | PRN

## 2023-07-06 ENCOUNTER — Telehealth: Payer: Self-pay

## 2023-07-06 DIAGNOSIS — E1140 Type 2 diabetes mellitus with diabetic neuropathy, unspecified: Secondary | ICD-10-CM

## 2023-07-06 NOTE — Telephone Encounter (Signed)
 Copied from CRM [id number]. Topic: Clinical - Prescription Issue >> Jul 06, 2023 10:03 AM Alessandra Bevels wrote: Reason for CRM: Patient is calling to report that he was only given a script for 30tablets of oxyCODONE (OXY IR/ROXICODONE) 5 MG immediate release tablet [[id number]]. Patient previously was given a one month supply of 90 tablets. Patient would like to know can 60 pills be called into [facility] [address] [place] - [address] [place] [address] Phone: [phone] Fax: [phone] Hours: Not open 24 hours

## 2023-07-09 NOTE — Telephone Encounter (Signed)
 Copied from CRM [id number]. Topic: Clinical - Prescription Issue >> Jul 09, 2023 11:41 AM Geroge Baseman wrote: Reason for CRM: Patient is waiting on his prescriptions from the pharmacy and he has not gotten a response from his last call on the 21st of march, he is very upset and states that anytime he calls, no one will call him back, please reach out to this patient to clarify where his prescription stands and how he can proceed, ASAP. Thank you

## 2023-07-10 MED ORDER — OXYCODONE HCL 5 MG PO TABS
5.0000 mg | ORAL_TABLET | Freq: Three times a day (TID) | ORAL | 0 refills | Status: DC | PRN
Start: 2023-07-10 — End: 2023-07-30

## 2023-07-10 NOTE — Addendum Note (Signed)
 Addended by: Jacquenette Shone on: 07/10/2023 07:58 AM   Modules accepted: Orders

## 2023-07-19 ENCOUNTER — Other Ambulatory Visit: Payer: Self-pay | Admitting: Family Medicine

## 2023-07-19 DIAGNOSIS — E1111 Type 2 diabetes mellitus with ketoacidosis with coma: Secondary | ICD-10-CM

## 2023-07-20 NOTE — Telephone Encounter (Signed)
 Requested Prescriptions  Pending Prescriptions Disp Refills   atorvastatin (LIPITOR) 10 MG tablet [Pharmacy Med Name: Atorvastatin Calcium 10 MG Oral Tablet] 100 tablet 2    Sig: TAKE 1 TABLET BY MOUTH DAILY     Cardiovascular:  Antilipid - Statins Failed - 07/20/2023 11:04 AM      Failed - Valid encounter within last 12 months    Recent Outpatient Visits   None     Future Appointments             In 3 weeks Pardue, Monico Blitz, DO [place] [facility], [facility]            Failed - Lipid Panel in normal range within the last 12 months    Cholesterol, Total  Date Value Ref Range Status  08/09/2022 148 100 - 199 mg/dL Final   LDL Chol Calc (NIH)  Date Value Ref Range Status  08/09/2022 90 0 - 99 mg/dL Final   HDL  Date Value Ref Range Status  08/09/2022 29 (L) >39 mg/dL Final   Triglycerides  Date Value Ref Range Status  08/09/2022 167 (H) 0 - 149 mg/dL Final         Passed - Patient is not pregnant

## 2023-07-30 ENCOUNTER — Other Ambulatory Visit: Payer: Self-pay | Admitting: Family Medicine

## 2023-07-30 DIAGNOSIS — E1140 Type 2 diabetes mellitus with diabetic neuropathy, unspecified: Secondary | ICD-10-CM

## 2023-07-30 NOTE — Telephone Encounter (Signed)
 Copied from CRM [id number]. Topic: Clinical - Medication Refill >> Jul 30, 2023 10:03 AM Alpha Arts wrote: Most Recent Primary Care Visit:  Provider: PAYNE, ELISE T  [facility]: [facility]  Visit Type: OFFICE VISIT  Date: 03/26/2023  Medication: oxyCODONE (OXY IR/ROXICODONE) 5 MG immediate release tablet   Has the patient contacted their pharmacy? Yes (Agent: If no, request that the patient contact the pharmacy for the refill. If patient does not wish to contact the pharmacy document the reason why and proceed with request.) (Agent: If yes, when and what did the pharmacy advise?)  Is this the correct pharmacy for this prescription? Yes If no, delete pharmacy and type the correct one.  This is the patient's preferred pharmacy:  [facility] - [place], [place] - [address] [place] [address] Phone: [phone] Fax: [phone]   Has the prescription been filled recently? Yes  Is the patient out of the medication? No  Has the patient been seen for an appointment in the last year OR does the patient have an upcoming appointment? Yes  Can we respond through MyChart? Yes  Agent: Please be advised that Rx refills may take up to 3 business days. We ask that you follow-up with your pharmacy.

## 2023-07-31 NOTE — Telephone Encounter (Signed)
 Requested medication (s) are due for refill today: Yes  Requested medication (s) are on the active medication list: Yes  Last refill:  07/10/23  Future visit scheduled: Yes  Notes to clinic:  Not delegated.    Requested Prescriptions  Pending Prescriptions Disp Refills   oxyCODONE (OXY IR/ROXICODONE) 5 MG immediate release tablet 60 tablet 0    Sig: Take 1 tablet (5 mg total) by mouth 3 (three) times daily as needed for severe pain (pain score 7-10).     Not Delegated - Analgesics:  Opioid Agonists Failed - 07/31/2023 11:58 AM      Failed - This refill cannot be delegated      Failed - Urine Drug Screen completed in last 360 days      Failed - Valid encounter within last 3 months    Recent Outpatient Visits   None     Future Appointments             In 1 week Pardue, Asencion Blacksmith, DO [place] [facility], [facility]

## 2023-08-02 MED ORDER — OXYCODONE HCL 5 MG PO TABS: 5.0000 mg | ORAL_TABLET | Freq: Three times a day (TID) | ORAL | 0 refills | Status: DC | PRN

## 2023-08-13 ENCOUNTER — Ambulatory Visit: Payer: Self-pay | Admitting: Family Medicine

## 2023-08-13 ENCOUNTER — Ambulatory Visit: Payer: 59 | Admitting: Family Medicine

## 2023-08-21 ENCOUNTER — Other Ambulatory Visit: Payer: Self-pay | Admitting: Family Medicine

## 2023-08-21 DIAGNOSIS — F410 Panic disorder [episodic paroxysmal anxiety] without agoraphobia: Secondary | ICD-10-CM

## 2023-08-21 NOTE — Telephone Encounter (Signed)
 Copied from CRM [id number]. Topic: Clinical - Medication Refill >> Aug 21, 2023 12:18 PM Star East wrote: Most Recent Primary Care Visit:  Provider: PAYNE, ELISE T  [facility]  Visit Type: OFFICE VISIT  Date: 03/26/2023  Medication: diazepam  (VALIUM ) 5 MG tablet Requesting to have refills   Has the patient contacted their pharmacy? Yes (Agent: If no, request that the patient contact the pharmacy for the refill. If patient does not wish to contact the pharmacy document the reason why and proceed with request.) (Agent: If yes, when and what did the pharmacy advise?)  Is this the correct pharmacy for this prescription? Yes If no, delete pharmacy and type the correct one.  This is the patient's preferred pharmacy:  [facility] - [place], [place] - [address] [place] [address] Phone: [phone] Fax: [phone]   Has the prescription been filled recently? Yes  Is the patient out of the medication? No  Has the patient been seen for an appointment in the last year OR does the patient have an upcoming appointment? Yes  Can we respond through MyChart? Yes  Agent: Please be advised that Rx refills may take up to 3 business days. We ask that you follow-up with your pharmacy.

## 2023-08-23 NOTE — Telephone Encounter (Signed)
 Requested medication (s) are due for refill today - yes  Requested medication (s) are on the active medication list -yes  Future visit scheduled -yes  Last refill: 04/05/23 #60 5RF  Notes to clinic: non delegated Rx  Requested Prescriptions  Pending Prescriptions Disp Refills   diazepam  (VALIUM ) 5 MG tablet 60 tablet 5    Sig: Take 1 tablet (5 mg total) by mouth every 12 (twelve) hours as needed for anxiety.     Not Delegated - Psychiatry: Anxiolytics/Hypnotics 2 Failed - 08/23/2023  1:25 PM      Failed - This refill cannot be delegated      Failed - Urine Drug Screen completed in last 360 days      Failed - Valid encounter within last 6 months    Recent Outpatient Visits   None            Passed - Patient is not pregnant         Requested Prescriptions  Pending Prescriptions Disp Refills   diazepam  (VALIUM ) 5 MG tablet 60 tablet 5    Sig: Take 1 tablet (5 mg total) by mouth every 12 (twelve) hours as needed for anxiety.     Not Delegated - Psychiatry: Anxiolytics/Hypnotics 2 Failed - 08/23/2023  1:25 PM      Failed - This refill cannot be delegated      Failed - Urine Drug Screen completed in last 360 days      Failed - Valid encounter within last 6 months    Recent Outpatient Visits   None            Passed - Patient is not pregnant

## 2023-08-23 NOTE — Telephone Encounter (Signed)
 Copied from CRM [id number]. Topic: Clinical - Medication Refill >> Aug 21, 2023 12:18 PM Star East wrote: Most Recent Primary Care Visit:  Provider: PAYNE, ELISE T  [facility]  Visit Type: OFFICE VISIT  Date: 03/26/2023  Medication: diazepam  (VALIUM ) 5 MG tablet Requesting to have refills   Has the patient contacted their pharmacy? Yes (Agent: If no, request that the patient contact the pharmacy for the refill. If patient does not wish to contact the pharmacy document the reason why and proceed with request.) (Agent: If yes, when and what did the pharmacy advise?)  Is this the correct pharmacy for this prescription? Yes If no, delete pharmacy and type the correct one.  This is the patient's preferred pharmacy:  [facility] - [place], [place] - [address] [place]  [address] Phone: [phone] Fax: [phone]   Has the prescription been filled recently? Yes  Is the patient out of the medication? No  Has the patient been seen for an appointment in the last year OR does the patient have an upcoming appointment? Yes  Can we respond through MyChart? Yes  Agent: Please be advised that Rx refills may take up to 3 business days. We ask that you follow-up with your pharmacy. >> Aug 23, 2023 12:36 PM Emylou G wrote: Patient called checking status.. wanting his diazePAM  5 mg Oral Every 12 hours PRN .Aaron Aas Number on file is good

## 2023-08-27 MED ORDER — DIAZEPAM 5 MG PO TABS: 5.0000 mg | ORAL_TABLET | Freq: Two times a day (BID) | ORAL | 2 refills | Status: DC | PRN

## 2023-08-28 ENCOUNTER — Other Ambulatory Visit: Payer: Self-pay | Admitting: Family Medicine

## 2023-08-28 DIAGNOSIS — E1140 Type 2 diabetes mellitus with diabetic neuropathy, unspecified: Secondary | ICD-10-CM

## 2023-08-28 NOTE — Telephone Encounter (Signed)
 Copied from CRM [id number]. Topic: Clinical - Medication Refill >> Aug 28, 2023  1:33 PM Oddis Bench wrote: Medication: oxyCODONE  (OXY IR/ROXICODONE ) 5 MG immediate release tablet  Has the patient contacted their pharmacy? Yes They informed the patient that needs refill sent from office  This is the patient's preferred pharmacy:  [facility] - [place], [place] - [address] [place] [address] Phone: [phone] Fax: [phone]  Is this the correct pharmacy for this prescription? Yes If no, delete pharmacy and type the correct one.   Has the prescription been filled recently? Yes  Is the patient out of the medication? No  Has the patient been seen for an appointment in the last year OR does the patient have an upcoming appointment? Yes  Can we respond through MyChart? No  Agent: Please be advised that Rx refills may take up to 3 business days. We ask that you follow-up with your pharmacy.

## 2023-08-29 ENCOUNTER — Other Ambulatory Visit: Payer: Self-pay | Admitting: Family Medicine

## 2023-08-29 DIAGNOSIS — E1140 Type 2 diabetes mellitus with diabetic neuropathy, unspecified: Secondary | ICD-10-CM

## 2023-08-29 NOTE — Telephone Encounter (Signed)
 Requested medication (s) are due for refill today -yes  Requested medication (s) are on the active medication list -yes  Future visit scheduled -yes  Last refill: 08/02/23 #90   Notes to clinic: non delegated Rx  Requested Prescriptions  Pending Prescriptions Disp Refills   oxyCODONE  (OXY IR/ROXICODONE ) 5 MG immediate release tablet 90 tablet 0    Sig: Take 1 tablet (5 mg total) by mouth 3 (three) times daily as needed for severe pain (pain score 7-10).     Not Delegated - Analgesics:  Opioid Agonists Failed - 08/29/2023  9:25 AM      Failed - This refill cannot be delegated      Failed - Urine Drug Screen completed in last 360 days      Failed - Valid encounter within last 3 months    Recent Outpatient Visits   None               Requested Prescriptions  Pending Prescriptions Disp Refills   oxyCODONE  (OXY IR/ROXICODONE ) 5 MG immediate release tablet 90 tablet 0    Sig: Take 1 tablet (5 mg total) by mouth 3 (three) times daily as needed for severe pain (pain score 7-10).     Not Delegated - Analgesics:  Opioid Agonists Failed - 08/29/2023  9:25 AM      Failed - This refill cannot be delegated      Failed - Urine Drug Screen completed in last 360 days      Failed - Valid encounter within last 3 months    Recent Outpatient Visits   None

## 2023-08-29 NOTE — Telephone Encounter (Signed)
 Requested medication (s) are due for refill today -yes  Requested medication (s) are on the active medication list -yes  Future visit scheduled -yes  Last refill: 08/02/23 #90  Notes to clinic: non delegated Rx-apologies  mis routed  Requested Prescriptions  Pending Prescriptions Disp Refills   oxyCODONE  (OXY IR/ROXICODONE ) 5 MG immediate release tablet 90 tablet 0    Sig: Take 1 tablet (5 mg total) by mouth 3 (three) times daily as needed for severe pain (pain score 7-10).     Not Delegated - Analgesics:  Opioid Agonists Failed - 08/29/2023 10:14 AM      Failed - This refill cannot be delegated      Failed - Urine Drug Screen completed in last 360 days      Failed - Valid encounter within last 3 months    Recent Outpatient Visits   None               Requested Prescriptions  Pending Prescriptions Disp Refills   oxyCODONE  (OXY IR/ROXICODONE ) 5 MG immediate release tablet 90 tablet 0    Sig: Take 1 tablet (5 mg total) by mouth 3 (three) times daily as needed for severe pain (pain score 7-10).     Not Delegated - Analgesics:  Opioid Agonists Failed - 08/29/2023 10:14 AM      Failed - This refill cannot be delegated      Failed - Urine Drug Screen completed in last 360 days      Failed - Valid encounter within last 3 months    Recent Outpatient Visits   None

## 2023-08-29 NOTE — Telephone Encounter (Signed)
 Requested medication (s) are due for refill today - yes  Requested medication (s) are on the active medication list -yes  Future visit scheduled -yes  Last refill: 08/02/23 #90   Notes to clinic: non delegated Rx- possible duplicate request   Requested Prescriptions  Pending Prescriptions Disp Refills   oxyCODONE  (OXY IR/ROXICODONE ) 5 MG immediate release tablet 90 tablet 0    Sig: Take 1 tablet (5 mg total) by mouth 3 (three) times daily as needed for severe pain (pain score 7-10).     Not Delegated - Analgesics:  Opioid Agonists Failed - 08/29/2023 10:16 AM      Failed - This refill cannot be delegated      Failed - Urine Drug Screen completed in last 360 days      Failed - Valid encounter within last 3 months    Recent Outpatient Visits   None               Requested Prescriptions  Pending Prescriptions Disp Refills   oxyCODONE  (OXY IR/ROXICODONE ) 5 MG immediate release tablet 90 tablet 0    Sig: Take 1 tablet (5 mg total) by mouth 3 (three) times daily as needed for severe pain (pain score 7-10).     Not Delegated - Analgesics:  Opioid Agonists Failed - 08/29/2023 10:16 AM      Failed - This refill cannot be delegated      Failed - Urine Drug Screen completed in last 360 days      Failed - Valid encounter within last 3 months    Recent Outpatient Visits   None

## 2023-08-29 NOTE — Telephone Encounter (Signed)
 Copied from CRM [id number]. Topic: Clinical - Medication Refill >> Aug 29, 2023  8:48 AM El Gravely T wrote: Medication: oxyCODONE  (OXY IR/ROXICODONE ) 5 MG immediate release table  Has the patient contacted their pharmacy? Yes (Agent: If no, request that the patient contact the pharmacy for the refill. If patient does not wish to contact the pharmacy document the reason why and proceed with request.) (Agent: If yes, when and what did the pharmacy advise?)  This is the patient's preferred pharmacy:  [facility] - [place], [place] - [address] [place]  [address] Phone: [phone] Fax: [phone]  Is this the correct pharmacy for this prescription? Yes If no, delete pharmacy and type the correct one.   Has the prescription been filled recently? Yes  Is the patient out of the medication? No  Has the patient been seen for an appointment in the last year OR does the patient have an upcoming appointment? Yes  Can we respond through MyChart? Yes  Agent: Please be advised that Rx refills may take up to 3 business days. We ask that you follow-up with your pharmacy.

## 2023-09-02 MED ORDER — OXYCODONE HCL 5 MG PO TABS: 5.0000 mg | ORAL_TABLET | Freq: Three times a day (TID) | ORAL | 0 refills | Status: DC | PRN

## 2023-09-04 ENCOUNTER — Other Ambulatory Visit: Payer: Self-pay | Admitting: Family Medicine

## 2023-09-04 DIAGNOSIS — E1111 Type 2 diabetes mellitus with ketoacidosis with coma: Secondary | ICD-10-CM

## 2023-09-05 ENCOUNTER — Ambulatory Visit: Admitting: Family Medicine

## 2023-09-05 ENCOUNTER — Encounter: Payer: Self-pay | Admitting: Family Medicine

## 2023-09-05 ENCOUNTER — Other Ambulatory Visit: Payer: Self-pay | Admitting: Family Medicine

## 2023-09-05 VITALS — BP 134/60 | HR 92 | Ht 73.0 in | Wt 193.0 lb

## 2023-09-05 DIAGNOSIS — E1140 Type 2 diabetes mellitus with diabetic neuropathy, unspecified: Secondary | ICD-10-CM

## 2023-09-05 DIAGNOSIS — Z794 Long term (current) use of insulin: Secondary | ICD-10-CM | POA: Diagnosis not present

## 2023-09-05 DIAGNOSIS — F410 Panic disorder [episodic paroxysmal anxiety] without agoraphobia: Secondary | ICD-10-CM

## 2023-09-05 DIAGNOSIS — I420 Dilated cardiomyopathy: Secondary | ICD-10-CM

## 2023-09-05 DIAGNOSIS — G8929 Other chronic pain: Secondary | ICD-10-CM

## 2023-09-05 DIAGNOSIS — Z79891 Long term (current) use of opiate analgesic: Secondary | ICD-10-CM | POA: Diagnosis not present

## 2023-09-05 DIAGNOSIS — F17200 Nicotine dependence, unspecified, uncomplicated: Secondary | ICD-10-CM

## 2023-09-05 DIAGNOSIS — F4310 Post-traumatic stress disorder, unspecified: Secondary | ICD-10-CM

## 2023-09-05 DIAGNOSIS — E0590 Thyrotoxicosis, unspecified without thyrotoxic crisis or storm: Secondary | ICD-10-CM | POA: Diagnosis not present

## 2023-09-05 DIAGNOSIS — R413 Other amnesia: Secondary | ICD-10-CM

## 2023-09-05 DIAGNOSIS — Z716 Tobacco abuse counseling: Secondary | ICD-10-CM

## 2023-09-05 DIAGNOSIS — Z7189 Other specified counseling: Secondary | ICD-10-CM | POA: Insufficient documentation

## 2023-09-05 DIAGNOSIS — Z79899 Other long term (current) drug therapy: Secondary | ICD-10-CM

## 2023-09-05 DIAGNOSIS — Z5320 Procedure and treatment not carried out because of patient's decision for unspecified reasons: Secondary | ICD-10-CM

## 2023-09-05 DIAGNOSIS — Z789 Other specified health status: Secondary | ICD-10-CM

## 2023-09-05 MED ORDER — OXYCODONE HCL 5 MG PO TABS: 5.0000 mg | ORAL_TABLET | Freq: Three times a day (TID) | ORAL | 0 refills | Status: DC | PRN

## 2023-09-05 NOTE — Progress Notes (Signed)
 Established patient visit   Patient: Joseph Luna   DOB: 1950-10-08   73 y.o. Male  MRN: [id number] Visit Date: 09/05/2023  Today's healthcare provider: Carlean Charter, DO   Chief Complaint  Patient presents with   Establish with new provider   Hyperlipidemia   Diabetes   Subjective    Hyperlipidemia Pertinent negatives include no chest pain or shortness of breath.  Diabetes Hypoglycemia symptoms include dizziness (occasionally when standing). Pertinent negatives for hypoglycemia include no headaches. Pertinent negatives for diabetes include no chest pain and no weakness.   Joseph Luna "Mac" is a 73 year old male with chronic pain diabetes and neuropathy who presents for medication management and follow-up.  He experiences ongoing neuropathic pain primarily in both feet and legs, with occasional radiation to his hands and arms. The pain is persistent and severe, often disrupting sleep, particularly in the early morning hours. He uses gabapentin  during the day and oxycodone  at night, noting that oxycodone  significantly sedates him. Despite this regimen, he has not had a pain-free day since leaving rehabilitation after his hospitalization in 2020.  He has a history of a stroke in February 2020, which resulted in significant weight loss during rehabilitation. He was in a medically induced coma for three days due to diabetic shock and was informed of a swollen vessel in his brain. Surgery was advised against due to the associated risks. He has not had a formal cardiology appointment in the past three years, despite previous heart issues, including an episode of arrhythmia where his heart was skipping every third beat.  His provider advised him at his last appointment that he only needed follow-up if he had concerns in the future.  He has declined lung cancer screening and Cologuard testing, citing age and personal preference; he is not interested in pursuing treatment if the studies  were to show anything. He also declined the shingles vaccine, attributing his decision to his Native [place] heritage and lack of childhood diseases. He smokes less than a pack a day, a reduction from his previous habit of two to three packs daily. He experiences occasional dizziness when standing quickly and occasional shortness of breath with significant exertion, but not during regular activities. He walks about a mile daily when weather permits and sometimes uses a cane for support.  He is awaiting cataract surgery and is managing with prescription eye drops and glasses. He has applied for charity care to assist with the cost of surgery and is otherwise saving money to try to afford it.  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 He states that he respects prescription medications, ensuring he does not overuse them.      Medications: Outpatient Medications Prior to Visit  Medication Sig   acetaminophen  (TYLENOL ) 500 MG tablet Take 500 mg by mouth every 6 (six) hours as needed.   aspirin  EC 81 MG EC tablet Take 1 tablet (81 mg total) by mouth daily.   atorvastatin  (LIPITOR) 10 MG tablet TAKE 1 TABLET BY MOUTH DAILY   diazepam  (VALIUM ) 5 MG tablet Take 1 tablet (5 mg total) by mouth every 12 (twelve) hours as needed for anxiety.   gabapentin  (NEURONTIN ) 600 MG tablet TAKE 1 TABLET BY MOUTH 3 TIMES  DAILY   insulin  isophane & regular human KwikPen (NOVOLIN  70/30 KWIKPEN) (70-30) 100 UNIT/ML KwikPen  Inject 27 Units into the skin 2 (two) times daily with a meal.   methimazole (TAPAZOLE) 10 MG tablet Take 10 mg by mouth daily. Two tablets daily on M,W,F and 1 tablets all other days.   [DISCONTINUED] oxyCODONE  (OXY IR/ROXICODONE ) 5 MG immediate release tablet Take 1 tablet (5 mg total) by mouth 3 (three) times daily as needed for severe pain (pain score 7-10).    No facility-administered medications prior to visit.    Review of Systems  Respiratory: Negative.  Negative for cough, shortness of breath and wheezing.   Cardiovascular:  Negative for chest pain, palpitations and leg swelling.  Gastrointestinal:  Negative for abdominal pain, constipation, diarrhea, nausea and vomiting.  Neurological:  Positive for dizziness (occasionally when standing) and numbness (from knees down bilaterally (no sensation at all)). Negative for weakness, light-headedness and headaches.        Objective    BP 134/60 (BP Location: Right Arm, Patient Position: Sitting, Cuff Size: Normal)   Pulse 92   Ht 6\' 1"  (1.854 m)   Wt 193 lb (87.5 kg)   BMI 25.46 kg/m     Physical Exam Vitals reviewed.  Constitutional:      General: He is not in acute distress.    Appearance: Normal appearance. He is not diaphoretic.  HENT:     Head: Normocephalic and atraumatic.  Eyes:     General: No scleral icterus.    Conjunctiva/sclera: Conjunctivae normal.  Cardiovascular:     Rate and Rhythm: Normal rate and regular rhythm.     Pulses: Normal pulses.     Heart sounds: Normal heart sounds. No murmur heard. Pulmonary:     Effort: Pulmonary effort is normal. No respiratory distress.     Breath sounds: Normal breath sounds. No wheezing or rhonchi.  Abdominal:     General: Bowel sounds are normal. There is no distension.     Palpations: Abdomen is soft. There is no mass.     Tenderness: There is no abdominal tenderness. There is no guarding.     Hernia: No hernia is present.  Musculoskeletal:     Cervical back: Neck supple.     Right lower leg: No edema.     Left lower leg: No edema.  Lymphadenopathy:     Cervical: No cervical adenopathy.  Skin:    General: Skin is warm and dry.     Findings: No rash.  Neurological:     Mental Status: He is alert and oriented to person, place, and time. Mental status is at baseline.  Psychiatric:        Mood and Affect: Mood  normal.        Behavior: Behavior normal.      Results for orders placed or performed in visit on 09/05/23  Comprehensive metabolic panel with GFR  Result Value Ref Range   Glucose 487 (H) 70 - 99 mg/dL   BUN 16 8 - 27 mg/dL   Creatinine, Ser 9.14 (H) 0.76 - 1.27 mg/dL   eGFR 45 (L) >78 GN/FAO/1.30   BUN/Creatinine Ratio 10 10 - 24   Sodium 132 (L) 134 - 144 mmol/L   Potassium 5.1 3.5 - 5.2 mmol/L   Chloride 93 (L) 96 - 106 mmol/L   CO2 23 20 - 29 mmol/L   Calcium  9.7 8.6 - 10.2 mg/dL   Total Protein 6.9 6.0 - 8.5 g/dL   Albumin 4.6 3.8 - 4.8 g/dL   Globulin, Total 2.3 1.5 - 4.5 g/dL   Bilirubin  Total 0.6 0.0 - 1.2 mg/dL   Alkaline Phosphatase 69 44 - 121 IU/L   AST 13 0 - 40 IU/L   ALT 20 0 - 44 IU/L  Lipid Panel With LDL/HDL Ratio  Result Value Ref Range   Cholesterol, Total 100 100 - 199 mg/dL   Triglycerides 161 (H) 0 - 149 mg/dL   HDL 29 (L) >09 mg/dL   VLDL Cholesterol Cal 29 5 - 40 mg/dL   LDL Chol Calc (NIH) 42 0 - 99 mg/dL   LDL/HDL Ratio 1.4 0.0 - 3.6 ratio  Microalbumin / creatinine urine ratio  Result Value Ref Range   Creatinine, Urine 58.0 Not Estab. mg/dL   Microalbumin, Urine <6.0 Not Estab. ug/mL   Microalb/Creat Ratio <5 0 - 29 mg/g creat  Hemoglobin A1c  Result Value Ref Range   Hgb A1c MFr Bld 11.3 (H) 4.8 - 5.6 %   Est. average glucose Bld gHb Est-mCnc 278 mg/dL  TSH  Result Value Ref Range   TSH 3.260 0.450 - 4.500 uIU/mL    Assessment & Plan    Type 2 diabetes mellitus with diabetic neuropathy, with long-term current use of insulin  (HCC) -     Comprehensive metabolic panel with GFR -     Hemoglobin A1c -     Lipid Panel With LDL/HDL Ratio -     Microalbumin / creatinine urine ratio  Transition of care  Other chronic pain  Long term prescription opiate use  Hyperthyroidism -     TSH  Dilated cardiomyopathy (HCC)  Panic attack due to post traumatic stress disorder (PTSD)  Long term prescription benzodiazepine use  Lung cancer  screening declined by patient  Colon cancer screening declined  Goals of care, counseling/discussion  Nicotine dependence with current use  Encounter for smoking cessation counseling  Memory impairment      Transition of care Declined lung cancer screening, Cologuard testing, and shingles vaccine. Smoking reduced. Regular walking for exercise. - Discontinue reminders for Cologuard testing. - Encourage continued reduction in smoking.  Chronic Pain Chronic pain from neuropathy and stroke affects quality of life. Managed with gabapentin  and oxycodone . - Continue gabapentin  and oxycodone  regimen. - Monitor for side effects and adjust treatment as necessary.  Long-term use of opiate analgesic Long-term oxycodone  use at night due to sedative effects. Acknowledged dependency risks due to substance abuse history. - Provide delayed fill prescription for oxycodone . - Monitor for signs of dependency and side effects.  Long-term use of gabapentin  Gabapentin  used long-term for neuropathic pain management. - Continue gabapentin  regimen. - Monitor effectiveness and adjust dosage as needed.  Panic attack due to posttraumatic stress syndrome; long-term prescription benzodiazepine use Continue diazepam  5 mg twice daily as needed.  Memory impairment Patient reports history of stroke in February 2020 which caused memory loss and neuropathy.  Per patient, diabetic shock led to hospitalization and coma.  Record shows he was found unresponsive and admitted for acute metabolic encephalopathy and acute respiratory failure related to DKA.  Type 2 Diabetes Mellitus with peripheral neuropathy with long-term current use of insulin  Diabetic shock in 2020 required hospitalization. Monitoring of blood glucose levels necessary.  Chronic neuropathic pain in feet, legs, hands, and arms. Severe pain impacts daily activities and sleep. Declined epidural injections offered by pain management previously. -  Continue gabapentin  and oxycodone  regimen. - Monitor pain levels and adjust medication as needed.  - Continue insulin  as directed - Follows with endocrinology; defer to them for glucose management - Eye exam record  requested - Podiatry appointment scheduled next month; gave patient medical records request form for podiatry, as he does not remember the name of the office at this time.  Dilated cardiomyopathy Released from cardiology management.  Patient asymptomatic.  No acute concerns at this time.  Continue to monitor.  Hypothyroidism On Tapazole and followed by endocrinology.  Defer to specialist management.  Nicotine dependence with current use; smoking cessation counseling Patient has reduced his cigarette use to under a pack a day but is not interested in complete cessation at this time and plans to continue on his own.  Cataracts Cataracts present, surgery pending financial arrangements. Managed with eye drops and glasses. - Request records from [facility] for cataract management.  Goals of Care Prefers not to pursue lung cancer screening or treatment, influenced by mother's cancer experience.    Follow-up Plans to obtain records from eye care and podiatry, schedule annual wellness visit. - Request records from [facility] and podiatry. - Schedule annual wellness visit.  Return in about 3 months (around 12/06/2023) for for mAWV with AWV nurse; and in 6 months for chronic f/u w/PCP.      I discussed the assessment and treatment plan with the patient  The patient was provided an opportunity to ask questions and all were answered. The patient agreed with the plan and demonstrated an understanding of the instructions.   The patient was advised to call back or seek an in-person evaluation if the symptoms worsen or if the condition fails to improve as anticipated.    Carlean Charter, DO  [facility] [phone] (phone) [phone]  (fax)  [facility]

## 2023-09-06 ENCOUNTER — Other Ambulatory Visit: Payer: Self-pay | Admitting: Family Medicine

## 2023-09-06 DIAGNOSIS — E1140 Type 2 diabetes mellitus with diabetic neuropathy, unspecified: Secondary | ICD-10-CM

## 2023-09-06 LAB — COMPREHENSIVE METABOLIC PANEL WITH GFR
ALT: 20 IU/L (ref 0–44)
AST: 13 IU/L (ref 0–40)
Albumin: 4.6 g/dL (ref 3.8–4.8)
Alkaline Phosphatase: 69 IU/L (ref 44–121)
BUN/Creatinine Ratio: 10 (ref 10–24)
BUN: 16 mg/dL (ref 8–27)
Bilirubin Total: 0.6 mg/dL (ref 0.0–1.2)
CO2: 23 mmol/L (ref 20–29)
Calcium: 9.7 mg/dL (ref 8.6–10.2)
Chloride: 93 mmol/L — ABNORMAL LOW (ref 96–106)
Creatinine, Ser: 1.61 mg/dL — ABNORMAL HIGH (ref 0.76–1.27)
Globulin, Total: 2.3 g/dL (ref 1.5–4.5)
Glucose: 487 mg/dL — ABNORMAL HIGH (ref 70–99)
Potassium: 5.1 mmol/L (ref 3.5–5.2)
Sodium: 132 mmol/L — ABNORMAL LOW (ref 134–144)
Total Protein: 6.9 g/dL (ref 6.0–8.5)
eGFR: 45 mL/min/{1.73_m2} — ABNORMAL LOW (ref >59)

## 2023-09-06 LAB — LIPID PANEL WITH LDL/HDL RATIO
Cholesterol, Total: 100 mg/dL (ref 100–199)
HDL: 29 mg/dL — ABNORMAL LOW
LDL Chol Calc (NIH): 42 mg/dL (ref 0–99)
LDL/HDL Ratio: 1.4 ratio (ref 0.0–3.6)
Triglycerides: 175 mg/dL — ABNORMAL HIGH (ref 0–149)
VLDL Cholesterol Cal: 29 mg/dL (ref 5–40)

## 2023-09-06 LAB — MICROALBUMIN / CREATININE URINE RATIO
Creatinine, Urine: 58 mg/dL
Microalb/Creat Ratio: <5 mg/g{creat} (ref 0–29)
Microalbumin, Urine: <3 ug/mL

## 2023-09-06 LAB — TSH: TSH: 3.26 u[IU]/mL (ref 0.450–4.500)

## 2023-09-06 LAB — HEMOGLOBIN A1C
Est. average glucose Bld gHb Est-mCnc: 278 mg/dL
Hgb A1c MFr Bld: 11.3 % — ABNORMAL HIGH (ref 4.8–5.6)

## 2023-09-06 MED ORDER — OXYCODONE HCL 5 MG PO TABS: 5.0000 mg | ORAL_TABLET | ORAL | 0 refills | Status: DC | PRN

## 2023-09-06 MED ORDER — OXYCODONE HCL 5 MG PO TABS: 5.0000 mg | ORAL_TABLET | Freq: Three times a day (TID) | ORAL | 0 refills | Status: DC | PRN

## 2023-09-13 ENCOUNTER — Ambulatory Visit: Payer: Self-pay | Admitting: Family Medicine

## 2023-09-18 DIAGNOSIS — Z79891 Long term (current) use of opiate analgesic: Secondary | ICD-10-CM | POA: Insufficient documentation

## 2023-09-18 DIAGNOSIS — Z79899 Other long term (current) drug therapy: Secondary | ICD-10-CM | POA: Insufficient documentation

## 2023-09-18 DIAGNOSIS — G8929 Other chronic pain: Secondary | ICD-10-CM | POA: Insufficient documentation

## 2023-09-20 DIAGNOSIS — E0590 Thyrotoxicosis, unspecified without thyrotoxic crisis or storm: Secondary | ICD-10-CM | POA: Diagnosis not present

## 2023-09-20 DIAGNOSIS — E1165 Type 2 diabetes mellitus with hyperglycemia: Secondary | ICD-10-CM | POA: Diagnosis not present

## 2023-09-20 DIAGNOSIS — Z794 Long term (current) use of insulin: Secondary | ICD-10-CM | POA: Diagnosis not present

## 2023-09-20 NOTE — Progress Notes (Signed)
 Error

## 2023-10-04 DIAGNOSIS — E1165 Type 2 diabetes mellitus with hyperglycemia: Secondary | ICD-10-CM | POA: Diagnosis not present

## 2023-10-04 DIAGNOSIS — Z794 Long term (current) use of insulin: Secondary | ICD-10-CM | POA: Diagnosis not present

## 2023-10-09 ENCOUNTER — Other Ambulatory Visit: Payer: Self-pay

## 2023-10-09 MED ORDER — OXYCODONE HCL 5 MG PO TABS: 5.0000 mg | ORAL_TABLET | ORAL | 0 refills | Status: AC | PRN

## 2023-10-09 NOTE — Telephone Encounter (Signed)
 I have checked the chart and patient got Rx dated for 09/02/23, 10/28/23 and one in August.  But he doesn't have one for June.  He has 1 pill left and he can't get either of the other 2 before 7/13.  Since he didn't have one for June can we send in enough to last until his 10/28/23 refill is due?  16-18 days of pills and send to his mail order pharmacy      Copied from CRM [id number]. Topic: Clinical - Prescription Issue >> Oct 09, 2023  9:09 AM Charlet HERO wrote: Reason for CRM: patient is calling bc he says his own going script for oxycodone  was sent into the pharmacy and he has not received his June  prescription and it was given to him for July and the pharmacy sent it for August he needs new scripts for  June, July and August. He is stating that the pharmacy will need this sent as soon as possible the patient only has one left. Please call the patient.

## 2023-10-23 ENCOUNTER — Ambulatory Visit: Admitting: Family Medicine

## 2023-10-23 ENCOUNTER — Encounter: Payer: Self-pay | Admitting: Family Medicine

## 2023-10-23 VITALS — BP 97/50 | HR 80 | Resp 16 | Ht 73.0 in | Wt 200.3 lb

## 2023-10-23 DIAGNOSIS — Z794 Long term (current) use of insulin: Secondary | ICD-10-CM

## 2023-10-23 DIAGNOSIS — Z5320 Procedure and treatment not carried out because of patient's decision for unspecified reasons: Secondary | ICD-10-CM | POA: Diagnosis not present

## 2023-10-23 DIAGNOSIS — N179 Acute kidney failure, unspecified: Secondary | ICD-10-CM | POA: Diagnosis not present

## 2023-10-23 DIAGNOSIS — N182 Chronic kidney disease, stage 2 (mild): Secondary | ICD-10-CM

## 2023-10-23 DIAGNOSIS — E1140 Type 2 diabetes mellitus with diabetic neuropathy, unspecified: Secondary | ICD-10-CM

## 2023-10-23 DIAGNOSIS — F410 Panic disorder [episodic paroxysmal anxiety] without agoraphobia: Secondary | ICD-10-CM

## 2023-10-23 DIAGNOSIS — F4310 Post-traumatic stress disorder, unspecified: Secondary | ICD-10-CM

## 2023-10-23 MED ORDER — OXYCODONE HCL 5 MG PO TABS: 5.0000 mg | ORAL_TABLET | Freq: Three times a day (TID) | ORAL | 0 refills | Status: DC | PRN

## 2023-10-23 MED ORDER — OXYCODONE HCL 5 MG PO TABS: 5.0000 mg | ORAL_TABLET | Freq: Three times a day (TID) | ORAL | 0 refills | Status: AC | PRN

## 2023-10-23 NOTE — Progress Notes (Signed)
 Established patient visit   Patient: Joseph Luna   DOB: 05/23/1950   73 y.o. Male  MRN: [id number] Visit Date: 10/23/2023  Today's healthcare provider: LAURAINE LOISE BUOY, DO   Chief Complaint  Patient presents with   Follow-up    On kidney functions repeat CMP Discuss Oxycodone    Subjective    HPI Joseph Luna is a 73 year old male with hypertension who presents for follow-up regarding his kidney function.  His blood pressure today is mildly low. He took his medication and insulin  10-15 minutes before the visit. No dizziness or lightheadedness. He had bacon for breakfast, which is unusual for him as he typically has [place] sausage and eggs.  His current medications include atorvastatin , methimazole, insulin , diazepam  as needed for anxiety, and oxycodone  as needed for pain. He takes diazepam  every two to three days due to anxiety related to his PTSD diagnosis. He also takes oxycodone  for pain management, particularly for severe neuropathy pain in his feet, which he experiences four to five days a week. The pain is so severe that he cannot tolerate any touch or movement of his toes on those days.  He has experienced issues with his medication prescriptions, particularly with oxycodone , where there was confusion about the prescription dates and availability at pharmacies. He received a 45-pill supply instead of the usual 90, which he was confused by. 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  He mentions a past A1c level of 11, but recent reports from his endocrinologist indicated it was down to 7, although he is unsure how this was determined as no blood work was done at that visit.   He smokes occasionally, with a pack lasting about four days, and is not interested in quitting smoking at this time. He denies being a chain smoker and notes he typically smokes  after meals or other specific activities.      Medications: Outpatient Medications Prior to Visit  Medication Sig   acetaminophen  (TYLENOL ) 500 MG tablet Take 500 mg by mouth every 6 (six) hours as needed.   aspirin  EC 81 MG EC tablet Take 1 tablet (81 mg total) by mouth daily.   atorvastatin  (LIPITOR) 10 MG tablet TAKE 1 TABLET BY MOUTH DAILY   diazepam  (VALIUM ) 5 MG tablet Take 1 tablet (5 mg total) by mouth every 12 (twelve) hours as needed for anxiety.   gabapentin  (NEURONTIN ) 600 MG tablet TAKE 1 TABLET BY MOUTH 3 TIMES  DAILY   insulin  isophane & regular human KwikPen (NOVOLIN  70/30 KWIKPEN) (70-30) 100 UNIT/ML KwikPen Inject 27 Units into the skin 2 (two) times daily with a meal.   methimazole (TAPAZOLE) 10 MG tablet Take 10 mg by mouth daily. Two tablets daily on M,W,F and 1 tablets all other days.   oxyCODONE  (OXY IR/ROXICODONE ) 5 MG immediate release tablet Take 1 tablet (5 mg total) by mouth every 4 (four) hours as needed for up to 19 days for severe pain (pain score 7-10).   [DISCONTINUED] oxyCODONE  (OXY IR/ROXICODONE ) 5 MG immediate release tablet Take 1 tablet (5 mg total) by mouth 3 (three) times daily as needed for severe pain (pain score 7-10).   [DISCONTINUED] oxyCODONE  (OXY IR/ROXICODONE ) 5 MG immediate release tablet Take 1 tablet (5 mg total) by mouth every 4 (four) hours as needed for severe pain (pain score 7-10).  No facility-administered medications prior to visit.        Objective    BP (!) 97/50 (BP Location: Right Arm, Patient Position: Sitting, Cuff Size: Normal)   Pulse 80   Resp 16   Ht 6' 1 (1.854 m)   Wt 200 lb 4.8 oz (90.9 kg)   SpO2 99%   BMI 26.43 kg/m     Physical Exam Vitals and nursing note reviewed.  Constitutional:      General: He is not in acute distress.    Appearance: Normal appearance.  HENT:     Head: Normocephalic and atraumatic.  Eyes:     General: No scleral icterus.    Conjunctiva/sclera: Conjunctivae normal.   Cardiovascular:     Rate and Rhythm: Normal rate.  Pulmonary:     Effort: Pulmonary effort is normal.  Neurological:     Mental Status: He is alert and oriented to person, place, and time. Mental status is at baseline.  Psychiatric:        Mood and Affect: Mood normal.        Behavior: Behavior normal.      No results found for any visits on 10/23/23.  Assessment & Plan    Acute kidney injury superimposed on stage 2 chronic kidney disease (HCC) -     Basic metabolic panel with GFR  Type 2 diabetes mellitus with diabetic neuropathy, with long-term current use of insulin  (HCC) -     oxyCODONE  HCl; Take 1 tablet (5 mg total) by mouth 3 (three) times daily as needed for severe pain (pain score 7-10).  Dispense: 90 tablet; Refill: 0 -     oxyCODONE  HCl; Take 1 tablet (5 mg total) by mouth 3 (three) times daily as needed for severe pain (pain score 7-10).  Dispense: 90 tablet; Refill: 0  Lung cancer screening declined by patient  Panic attack due to post traumatic stress disorder (PTSD)     Acute kidney injury superimposed on stage 2 chronic kidney disease Recheck basic metabolic panel today.  Will plan to refer to nephrology for specialist evaluation and recommendations if kidney function is not improved today.  Diabetic Neuropathy; Chronic Pain Management  Severe foot pain managed with oxycodone , cautious use due to past substance issues. - Continue oxycodone  as needed for pain management. - Declined foot examination due to severe pain.  Diabetes Mellitus Diabetes management discussed, recent A1c reported as 7.8 in October 2024. - Order metabolic panel to assess kidney function.  Panic Attack due to Post-Traumatic Stress Disorder (PTSD) Diazepam  used as needed for anxiety management. - Continue diazepam  as needed for anxiety management.  General Health Maintenance Declined foot exam and lung cancer screening, uninterested in smoking cessation. - Respect decision to  decline lung cancer screening and smoking cessation.   Follow-up appointment and mAWV scheduled for November.  No follow-ups on file.      I discussed the assessment and treatment plan with the patient  The patient was provided an opportunity to ask questions and all were answered. The patient agreed with the plan and demonstrated an understanding of the instructions.   The patient was advised to call back or seek an in-person evaluation if the symptoms worsen or if the condition fails to improve as anticipated.    LAURAINE LOISE BUOY, DO  [facility] [phone] (phone) [phone] (fax)  [facility]

## 2023-10-24 LAB — BASIC METABOLIC PANEL WITH GFR
BUN/Creatinine Ratio: 10 (ref 10–24)
BUN: 14 mg/dL (ref 8–27)
CO2: 23 mmol/L (ref 20–29)
Calcium: 9.1 mg/dL (ref 8.6–10.2)
Chloride: 101 mmol/L (ref 96–106)
Creatinine, Ser: 1.46 mg/dL — ABNORMAL HIGH (ref 0.76–1.27)
Glucose: 238 mg/dL — ABNORMAL HIGH (ref 70–99)
Potassium: 4.7 mmol/L (ref 3.5–5.2)
Sodium: 138 mmol/L (ref 134–144)
eGFR: 51 mL/min/1.73 — ABNORMAL LOW (ref >59)

## 2023-10-29 ENCOUNTER — Other Ambulatory Visit: Payer: Self-pay | Admitting: Family Medicine

## 2023-10-29 DIAGNOSIS — E1111 Type 2 diabetes mellitus with ketoacidosis with coma: Secondary | ICD-10-CM

## 2023-10-29 DIAGNOSIS — E1140 Type 2 diabetes mellitus with diabetic neuropathy, unspecified: Secondary | ICD-10-CM

## 2023-10-29 NOTE — Telephone Encounter (Signed)
 Copied from CRM [id number]. Topic: Clinical - Medication Refill >> Oct 29, 2023  8:45 AM Rosaria E wrote: Medication: Needs new prescription, for oxyCODONE  (OXY IR/ROXICODONE ) 5 MG immediate release tablet. Runs out in a few days. Pharmacy needs new Rx, the previous one has been denied. Pharmacy did not initially have prescription in stock.   Has the patient contacted their pharmacy? Yes (Agent: If no, request that the patient contact the pharmacy for the refill. If patient does not wish to contact the pharmacy document the reason why and proceed with request.) (Agent: If yes, when and what did the pharmacy advise?)  This is the patient's preferred pharmacy:  [facility] - [employer], [place] - [address] [place]  [address] Phone: [phone] Fax: [phone]  Is this the correct pharmacy for this prescription? Yes If no, delete pharmacy and type the correct one.   Has the prescription been filled recently? Yes  Is the patient out of the medication? Yes  Has the patient been seen for an appointment in the last year OR does the patient have an upcoming appointment? Yes  Can we respond through MyChart? Yes  Agent: Please be advised that Rx refills may take up to 3 business days. We ask that you follow-up with your pharmacy.

## 2023-10-31 ENCOUNTER — Telehealth: Payer: Self-pay

## 2023-10-31 NOTE — Telephone Encounter (Signed)
 LOV 10/23/23 NOV 03/07/24 LRF 10/11/23 qty:45 r:0 Dispense report qty:19 He has oxycodone  pending for the months of August and September please advise.

## 2023-10-31 NOTE — Telephone Encounter (Signed)
 Copied from CRM [id number]. Topic: Clinical - Prescription Issue >> Oct 31, 2023  8:15 AM Fonda T wrote: Reason for CRM: Patient calling in regards to an ongoing issue with medication refill, for oxyCODONE  (OXY IR/ROXICODONE ) 5 MG immediate release tablet.  Per patient states, per pharmacy, New prescriptions needs to be sent via fax to preferred pharmacy due to previous missed month of medication, which in turn has missed timeline of how patient is to take medication.   Pharmacy states in order to fix the issue, a New prescription for medication needs to be faxed, and Delete the previous prescriptions to allow patient to get back on schedule with the intervals of taking medication as he needs.   Per patient, states he will be out of medication in the next day, so is requesting this be done as soon as possible. States the last prescription filled was half the quantity of what it was supposed to be due to an error of the prescription.   Patient is requesting a follow up phone call to inform when this has been completed, can be reached at [phone].  Preferred pharmacy:  [facility] - [place], [place] - [address] [place] [address] Phone: [phone] Fax: [phone]

## 2023-11-01 ENCOUNTER — Telehealth: Payer: Self-pay

## 2023-11-01 ENCOUNTER — Ambulatory Visit: Payer: Self-pay | Admitting: Family Medicine

## 2023-11-01 DIAGNOSIS — Z79891 Long term (current) use of opiate analgesic: Secondary | ICD-10-CM

## 2023-11-01 DIAGNOSIS — G8929 Other chronic pain: Secondary | ICD-10-CM

## 2023-11-01 MED ORDER — OXYCODONE HCL 5 MG PO TABS: 5.0000 mg | ORAL_TABLET | Freq: Three times a day (TID) | ORAL | 0 refills | Status: DC | PRN

## 2023-11-01 MED ORDER — OXYCODONE-ACETAMINOPHEN 5-325 MG PO TABS
1.0000 | ORAL_TABLET | Freq: Three times a day (TID) | ORAL | 0 refills | Status: DC | PRN
Start: 2023-11-27 — End: 2023-11-02

## 2023-11-01 NOTE — Telephone Encounter (Signed)
 Copied from CRM [id number]. Topic: Clinical - Prescription Issue >> Nov 01, 2023  1:33 PM Delon T wrote: Reason for CRM: oxyCODONE  (OXY IR/ROXICODONE ) 5 MG immediate release tablet- still waiting for refill- he needs the correction made so he can get his meds, he is almost out

## 2023-11-02 ENCOUNTER — Other Ambulatory Visit: Payer: Self-pay | Admitting: Family Medicine

## 2023-11-02 DIAGNOSIS — Z79891 Long term (current) use of opiate analgesic: Secondary | ICD-10-CM

## 2023-11-02 DIAGNOSIS — G8929 Other chronic pain: Secondary | ICD-10-CM

## 2023-11-02 MED ORDER — OXYCODONE HCL 5 MG PO TABS: 5.0000 mg | ORAL_TABLET | Freq: Three times a day (TID) | ORAL | 0 refills | Status: DC | PRN

## 2023-11-02 NOTE — Telephone Encounter (Signed)
 A previous encounter for request has been sent to provider. Awaiting response and will close this one to not have multiple message request on medication refill for same medicine.

## 2023-11-06 ENCOUNTER — Other Ambulatory Visit: Payer: Self-pay | Admitting: Family Medicine

## 2023-11-06 DIAGNOSIS — Z794 Long term (current) use of insulin: Secondary | ICD-10-CM

## 2023-11-06 DIAGNOSIS — E1111 Type 2 diabetes mellitus with ketoacidosis with coma: Secondary | ICD-10-CM

## 2023-11-06 DIAGNOSIS — F4310 Post-traumatic stress disorder, unspecified: Secondary | ICD-10-CM

## 2023-11-06 NOTE — Telephone Encounter (Signed)
 Copied from CRM [id number]. Topic: Clinical - Medication Refill >> Nov 06, 2023  9:11 AM Elle L wrote: Medication: atorvastatin  (LIPITOR) 10 MG tablet AND methimazole (TAPAZOLE) 10 MG tablet AND gabapentin  (NEURONTIN ) 600 MG tablet AND diazepam  (VALIUM ) 5 MG tablet   Has the patient contacted their pharmacy? Yes  This is the patient's preferred pharmacy:  [facility] - [place], [place] - [address] [place]  [address] Phone: [phone] Fax: [phone]  Is this the correct pharmacy for this prescription? Yes  Has the prescription been filled recently? No  Is the patient out of the medication? No  Has the patient been seen for an appointment in the last year OR does the patient have an upcoming appointment? Yes  Can we respond through MyChart? No  Agent: Please be advised that Rx refills may take up to 3 business days. We ask that you follow-up with your pharmacy.

## 2023-11-07 ENCOUNTER — Telehealth: Payer: Self-pay | Admitting: Family Medicine

## 2023-11-07 NOTE — Telephone Encounter (Signed)
 Advised patient of lab results and he says he will drink more water  and watch his diet. Patient stated he has the freestyle libre.

## 2023-11-07 NOTE — Telephone Encounter (Unsigned)
 Copied from CRM [id number]. Topic: Clinical - Lab/Test Results >> Nov 07, 2023  9:52 AM Donee H wrote: Reason for CRM: Patient returning call regarding lab results. Patient would like someone to explain the results to him. Patient disconnected call before I could get him over to speak with a nurse regarding results. Please follow up with patient.

## 2023-11-07 NOTE — Telephone Encounter (Signed)
 Nurse attempted to reach pt: no answer: left voicemail for pt to call PCP office main number.  Copied from CRM [id number]. Topic: Clinical - Lab/Test Results >> Nov 07, 2023  9:52 AM Donee H wrote: Reason for CRM: Patient returning call regarding lab results. Patient would like someone to explain the results to him. Patient disconnected call before I could get him over to speak with a nurse regarding results. Please follow up with patient.

## 2023-11-08 NOTE — Telephone Encounter (Signed)
 Requested medication (s) are due for refill today: valium : no   Requested medication (s) are on the active medication list: Hx med : methimazole             valium  : yes   Last refill:  Valium : 08/27/23 #602 RF  methimazole: 06/13/18  Future visit scheduled: yes  Notes to clinic:  Valium : not delegated to NT to RF  Methimazole: hx provider   Requested Prescriptions  Pending Prescriptions Disp Refills   diazepam  (VALIUM ) 5 MG tablet 60 tablet     Sig: Take 1 tablet (5 mg total) by mouth every 12 (twelve) hours as needed for anxiety.     Not Delegated - Psychiatry: Anxiolytics/Hypnotics 2 Failed - 11/08/2023 11:13 AM      Failed - This refill cannot be delegated      Failed - Urine Drug Screen completed in last 360 days      Passed - Patient is not pregnant      Passed - Valid encounter within last 6 months    Recent Outpatient Visits           2 weeks ago Acute kidney injury superimposed on stage 2 chronic kidney disease [facility])   [place] [facility], Lauraine SAILOR, DO   2 months ago Type 2 diabetes mellitus with diabetic neuropathy, with long-term current use of insulin  [facility])   [place] [facility], Lauraine SAILOR, DO       Future Appointments             In 4 months [place] [facility], [facility]              methimazole (TAPAZOLE) 10 MG tablet      Sig: Take 1 tablet (10 mg total) by mouth daily. Two tablets daily on M,W,F and 1 tablets all other days.     Not Delegated - Endocrinology:  Hyperthyroid Agents Failed - 11/08/2023 11:13 AM      Failed - This refill cannot be delegated      Failed - T3 Total in normal range and within 180 days    T3, Free  Date Value Ref Range Status  07/30/2018 2.8 2.0 - 4.4 pg/mL Final         Failed - T4 free in normal range and within 180 days    T4, Total  Date Value Ref Range Status  12/06/2020 7.3 4.5 - 12.0 ug/dL Final   Free T4  Date Value Ref Range Status  01/19/2022  1.02 0.82 - 1.77 ng/dL Final         Passed - TSH in normal range and within 180 days    TSH  Date Value Ref Range Status  09/05/2023 3.260 0.450 - 4.500 uIU/mL Final         Passed - Valid encounter within last 6 months    Recent Outpatient Visits           2 weeks ago Acute kidney injury superimposed on stage 2 chronic kidney disease [facility])   [place] [facility], Lauraine SAILOR, DO   2 months ago Type 2 diabetes mellitus with diabetic neuropathy, with long-term current use of insulin  [facility])   [place] [facility], Lauraine SAILOR, DO       Future Appointments             In 4 months  [facility], [facility]  Refused Prescriptions Disp Refills   atorvastatin  (LIPITOR) 10 MG tablet 90 tablet     Sig: Take 1 tablet (10 mg total) by mouth daily.     Cardiovascular:  Antilipid - Statins Failed - 11/08/2023 11:13 AM      Failed - Lipid Panel in normal range within the last 12 months    Cholesterol, Total  Date Value Ref Range Status  09/05/2023 100 100 - 199 mg/dL Final   LDL Chol Calc (NIH)  Date Value Ref Range Status  09/05/2023 42 0 - 99 mg/dL Final   HDL  Date Value Ref Range Status  09/05/2023 29 (L) >39 mg/dL Final   Triglycerides  Date Value Ref Range Status  09/05/2023 175 (H) 0 - 149 mg/dL Final         Passed - Patient is not pregnant      Passed - Valid encounter within last 12 months    Recent Outpatient Visits           2 weeks ago Acute kidney injury superimposed on stage 2 chronic kidney disease (HCC)   [place] [facility], Lauraine SAILOR, DO   2 months ago Type 2 diabetes mellitus with diabetic neuropathy, with long-term current use of insulin  [facility])   [place] [facility] P[facility]ue, Lauraine SAILOR, DO       Future Appointments             In 4 months [place] [facility], [facility]              gabapentin  (NEURONTIN ) 600 MG  tablet 300 tablet     Sig: Take 1 tablet (600 mg total) by mouth 3 (three) times daily.     Neurology: Anticonvulsants - gabapentin  Failed - 11/08/2023 11:13 AM      Failed - Cr in normal range and within 360 days    Creatinine  Date Value Ref Range Status  05/04/2012 0.97 0.60 - 1.30 mg/dL Final   Creatinine, Ser  Date Value Ref Range Status  10/23/2023 1.46 (H) 0.76 - 1.27 mg/dL Final         Passed - Completed PHQ-2 or PHQ-9 in the last 360 days      Passed - Valid encounter within last 12 months    Recent Outpatient Visits           2 weeks ago Acute kidney injury superimposed on stage 2 chronic kidney disease [facility])   [place] [facility] Pardue, Lauraine SAILOR, DO   2 months ago Type 2 diabetes mellitus with diabetic neuropathy, with long-term current use of insulin  [place])   [place] [facility], Lauraine SAILOR, DO       Future Appointments             In 4 months [place] [facility], [facility]

## 2023-11-08 NOTE — Telephone Encounter (Signed)
 Requested Prescriptions  Pending Prescriptions Disp Refills   atorvastatin  (LIPITOR) 10 MG tablet 90 tablet     Sig: Take 1 tablet (10 mg total) by mouth daily.     Cardiovascular:  Antilipid - Statins Failed - 11/08/2023 11:11 AM      Failed - Lipid Panel in normal range within the last 12 months    Cholesterol, Total  Date Value Ref Range Status  09/05/2023 100 100 - 199 mg/dL Final   LDL Chol Calc (NIH)  Date Value Ref Range Status  09/05/2023 42 0 - 99 mg/dL Final   HDL  Date Value Ref Range Status  09/05/2023 29 (L) >39 mg/dL Final   Triglycerides  Date Value Ref Range Status  09/05/2023 175 (H) 0 - 149 mg/dL Final         Passed - Patient is not pregnant      Passed - Valid encounter within last 12 months    Recent Outpatient Visits           2 weeks ago Acute kidney injury superimposed on stage 2 chronic kidney disease [facility])   [place] [facility] P[facility], Lauraine SAILOR, DO   2 months ago Type 2 diabetes mellitus with diabetic neuropathy, with long-term current use of insulin  [facility])   [place] [facility] Pard[facility], Lauraine SAILOR, DO       Future Appointments             In 4 months [place] [facility], [facility]              diazepam  (VALIUM ) 5 MG tablet 60 tablet     Sig: Take 1 tablet (5 mg total) by mouth every 12 (twelve) hours as needed for anxiety.     Not Delegated - Psychiatry: Anxiolytics/Hypnotics 2 Failed - 11/08/2023 11:11 AM      Failed - This refill cannot be delegated      Failed - Urine Drug Screen completed in last 360 days      Passed - Patient is not pregnant      Passed - Valid encounter within last 6 months    Recent Outpatient Visits           2 weeks ago Acute kidney injury superimposed on stage 2 chronic kidney disease [facility])   [place] [facility], Lauraine SAILOR, DO   2 months ago Type 2 diabetes mellitus with diabetic neuropathy, with long-term current use of insulin   [facility])   [place] [facility] Pardue, Lauraine SAILOR, DO       Future Appointments             In 4 months [place]  [facility]              gabapentin  (NEURONTIN ) 600 MG tablet 300 tablet     Sig: Take 1 tablet (600 mg total) by mouth 3 (three) times daily.     Neurology: Anticonvulsants - gabapentin  Failed - 11/08/2023 11:11 AM      Failed - Cr in normal range and within 360 days    Creatinine  Date Value Ref Range Status  05/04/2012 0.97 0.60 - 1.30 mg/dL Final   Creatinine, Ser  Date Value Ref Range Status  10/23/2023 1.46 (H) 0.76 - 1.27 mg/dL Final         Passed - Completed PHQ-2 or PHQ-9 in the last 360 days      Passed - Valid encounter within  last 12 months    Recent Outpatient Visits           2 weeks ago Acute kidney injury superimposed on stage 2 chronic kidney disease [facility])   [place] [facility], Lauraine SAILOR, DO   2 months ago Type 2 diabetes mellitus with diabetic neuropathy, with long-term current use of insulin  [facility])   [place] [facility] P[facility], Lauraine SAILOR, DO       Future Appointments             In 4 months [place] [facility], [facility]              methimazole (TAPAZOLE) 10 MG tablet      Sig: Take 1 tablet (10 mg total) by mouth daily. Two tablets daily on M,W,F and 1 tablets all other days.     Not Delegated - Endocrinology:  Hyperthyroid Agents Failed - 11/08/2023 11:11 AM      Failed - This refill cannot be delegated      Failed - T3 Total in normal range and within 180 days    T3, Free  Date Value Ref Range Status  07/30/2018 2.8 2.0 - 4.4 pg/mL Final         Failed - T4 free in normal range and within 180 days    T4, Total  Date Value Ref Range Status  12/06/2020 7.3 4.5 - 12.0 ug/dL Final   Free T4  Date Value Ref Range Status  01/19/2022 1.02 0.82 - 1.77 ng/dL Final         Passed - TSH in normal range and within 180 days    TSH   Date Value Ref Range Status  09/05/2023 3.260 0.450 - 4.500 uIU/mL Final         Passed - Valid encounter within last 6 months    Recent Outpatient Visits           2 weeks ago Acute kidney injury superimposed on stage 2 chronic kidney disease [facility])   [place] [facility], Lauraine SAILOR, DO   2 months ago Type 2 diabetes mellitus with diabetic neuropathy, with long-term current use of insulin  [facility])   [place] [facility] P[facility], Lauraine SAILOR, DO       Future Appointments             In 4 months [place] [facility], [facility]

## 2023-11-09 MED ORDER — ATORVASTATIN CALCIUM 10 MG PO TABS: 10.0000 mg | ORAL_TABLET | Freq: Every day | ORAL | 3 refills | Status: AC

## 2023-11-09 MED ORDER — DIAZEPAM 5 MG PO TABS: 5.0000 mg | ORAL_TABLET | Freq: Two times a day (BID) | ORAL | 4 refills | Status: DC | PRN

## 2023-11-09 MED ORDER — GABAPENTIN 600 MG PO TABS: 600.0000 mg | ORAL_TABLET | Freq: Three times a day (TID) | ORAL | 3 refills | Status: AC

## 2023-11-09 NOTE — Telephone Encounter (Signed)
 [facility] 2nd request -pharmacy faxed refill request for the following medications:   gabapentin  (NEURONTIN ) 600 MG tablet    Please advise

## 2023-12-06 DIAGNOSIS — E1165 Type 2 diabetes mellitus with hyperglycemia: Secondary | ICD-10-CM | POA: Diagnosis not present

## 2023-12-26 ENCOUNTER — Other Ambulatory Visit: Payer: Self-pay | Admitting: Family Medicine

## 2023-12-26 DIAGNOSIS — G8929 Other chronic pain: Secondary | ICD-10-CM

## 2023-12-26 DIAGNOSIS — E1140 Type 2 diabetes mellitus with diabetic neuropathy, unspecified: Secondary | ICD-10-CM

## 2023-12-26 DIAGNOSIS — Z79891 Long term (current) use of opiate analgesic: Secondary | ICD-10-CM

## 2023-12-26 NOTE — Telephone Encounter (Unsigned)
 Copied from CRM [id number]. Topic: Clinical - Medication Refill >> Dec 26, 2023  8:35 AM Myrick T wrote: Medication: oxyCODONE  (OXY IR/ROXICODONE ) 5 MG immediate release tablet  Has the patient contacted their pharmacy? Yes  This is the patient's preferred pharmacy:  [facility] - [place], [place] - [address] [place] [address] Phone: [phone] Fax: [phone]  Is this the correct pharmacy for this prescription? Yes  Has the prescription been filled recently? Yes  Is the patient out of the medication? Yes  Has the patient been seen for an appointment in the last year OR does the patient have an upcoming appointment? Yes  Can we respond through MyChart? Yes  Agent: Please be advised that Rx refills may take up to 3 business days. We ask that you follow-up with your pharmacy.

## 2023-12-27 NOTE — Telephone Encounter (Signed)
 Requested medication (s) are due for refill today: no  Requested medication (s) are on the active medication list: yes  Last refill:  12/27/23 #90  Future visit scheduled: yes  Notes to clinic:  med not delegated to NT to RF-    Requested Prescriptions  Pending Prescriptions Disp Refills   oxyCODONE  (OXY IR/ROXICODONE ) 5 MG immediate release tablet 90 tablet 0    Sig: Take 1 tablet (5 mg total) by mouth 3 (three) times daily as needed for severe pain (pain score 7-10).     Not Delegated - Analgesics:  Opioid Agonists Failed - 12/27/2023 12:16 PM      Failed - This refill cannot be delegated      Failed - Urine Drug Screen completed in last 360 days      Passed - Valid encounter within last 3 months    Recent Outpatient Visits           2 months ago Acute kidney injury superimposed on stage 2 chronic kidney disease [facility])   [place] [facility], Lauraine SAILOR, DO   3 months ago Type 2 diabetes mellitus with diabetic neuropathy, with long-term current use of insulin  [facility])   [facility] P[facility], Lauraine SAILOR, DO

## 2023-12-28 MED ORDER — OXYCODONE HCL 5 MG PO TABS: 5.0000 mg | ORAL_TABLET | Freq: Three times a day (TID) | ORAL | 0 refills | Status: AC | PRN

## 2023-12-28 MED ORDER — OXYCODONE HCL 5 MG PO TABS: 5.0000 mg | ORAL_TABLET | Freq: Three times a day (TID) | ORAL | 0 refills | Status: DC | PRN

## 2024-01-10 DIAGNOSIS — E1165 Type 2 diabetes mellitus with hyperglycemia: Secondary | ICD-10-CM | POA: Diagnosis not present

## 2024-01-28 ENCOUNTER — Telehealth: Payer: Self-pay

## 2024-01-28 NOTE — Telephone Encounter (Signed)
 Copied from CRM [id number]. Topic: Clinical - Prescription Issue >> Jan 28, 2024 12:02 PM Rosaria BRAVO wrote: Reason for CRM: Pt called to report that his pharmacy has not sent his oxycodone . He says he is getting very anemic because of having to miss his doses.   [facility] - [place], [place] - [address] [place] [address] Phone: [phone] Fax: [phone]

## 2024-01-31 NOTE — Telephone Encounter (Signed)
 Called pharmacy to release the medication, they informed me that it was sent out on 01/29/2024 and it was tracked she stated that it was in transit and should be delivered some time today.  Called patient and informed him of this.  He verbalized understanding.

## 2024-02-05 LAB — HM DIABETES FOOT EXAM: HM Diabetic Foot Exam: NORMAL

## 2024-03-07 ENCOUNTER — Ambulatory Visit: Admitting: Family Medicine

## 2024-03-12 ENCOUNTER — Ambulatory Visit

## 2024-04-02 NOTE — Progress Notes (Signed)
 Joseph Luna                                          MRN: [id number]   04/02/2024   The [facility] Quality Team Specialist reviewed this patient medical record for the purposes of chart review for care gap closure. The following were reviewed: abstraction for care gap closure-glycemic status assessment.    [facility]BCI Quality Team

## 2024-04-09 ENCOUNTER — Other Ambulatory Visit: Payer: Self-pay | Admitting: Family Medicine

## 2024-04-09 DIAGNOSIS — F410 Panic disorder [episodic paroxysmal anxiety] without agoraphobia: Secondary | ICD-10-CM

## 2024-04-11 ENCOUNTER — Other Ambulatory Visit: Payer: Self-pay | Admitting: Family Medicine

## 2024-04-11 DIAGNOSIS — F410 Panic disorder [episodic paroxysmal anxiety] without agoraphobia: Secondary | ICD-10-CM

## 2024-04-11 NOTE — Telephone Encounter (Signed)
 Copied from CRM #[id number]. Topic: Clinical - Medication Refill >> Apr 11, 2024  2:00 PM Berwyn MATSU wrote: Medication: diazepam  (VALIUM ) 5 MG tablet [[id number]]  Has the patient contacted their pharmacy? Yes (Agent: If no, request that the patient contact the pharmacy for the refill. If patient does not wish to contact the pharmacy document the reason why and proceed with request.) (Agent: If yes, when and what did the pharmacy advise?)  This is the patient's preferred pharmacy:  [facility] - [place], [place] - [address] [place] [address] Phone: [phone] Fax: [phone]  Is this the correct pharmacy for this prescription? Yes If no, delete pharmacy and type the correct one.   Has the prescription been filled recently? Yes  Is the patient out of the medication? No  Has the patient been seen for an appointment in the last year OR does the patient have an upcoming appointment? Yes  Can we respond through MyChart? no  Agent: Please be advised that Rx refills may take up to 3 business days. We ask that you follow-up with your pharmacy.

## 2024-04-14 NOTE — Telephone Encounter (Signed)
 Requested medication (s) are due for refill today: Yes  Requested medication (s) are on the active medication list: Yes  Last refill:  11/09/23  Future visit scheduled: Yes  Notes to clinic:  Unable to refill per protocol, cannot delegate. 3rd request follow up. See notes from pharmacy regarding Rx.      Requested Prescriptions  Pending Prescriptions Disp Refills   diazepam  (VALIUM ) 5 MG tablet 60 tablet 4    Sig: Take 1 tablet (5 mg total) by mouth every 12 (twelve) hours as needed for anxiety.     Not Delegated - Psychiatry: Anxiolytics/Hypnotics 2 Failed - 04/14/2024  3:23 PM      Failed - This refill cannot be delegated      Failed - Urine Drug Screen completed in last 360 days      Passed - Patient is not pregnant      Passed - Valid encounter within last 6 months    Recent Outpatient Visits           5 months ago Acute kidney injury superimposed on stage 2 chronic kidney disease   [place] [facility] Pardue, Sarah N, DO   7 months ago Type 2 diabetes mellitus with diabetic neuropathy, with long-term current use of insulin  [facility])   [facility] P[facility]ue, Lauraine SAILOR, DO

## 2024-04-22 ENCOUNTER — Other Ambulatory Visit: Payer: Self-pay | Admitting: Family Medicine

## 2024-04-22 DIAGNOSIS — E1140 Type 2 diabetes mellitus with diabetic neuropathy, unspecified: Secondary | ICD-10-CM

## 2024-04-22 DIAGNOSIS — Z79891 Long term (current) use of opiate analgesic: Secondary | ICD-10-CM

## 2024-04-22 DIAGNOSIS — G8929 Other chronic pain: Secondary | ICD-10-CM

## 2024-04-22 NOTE — Telephone Encounter (Signed)
 Copied from CRM [id number]. Topic: Clinical - Medication Refill >> Apr 22, 2024 12:29 PM Joseph Luna wrote: Medication:  oxyCODONE  (OXY IR/ROXICODONE ) 5 MG immediate release tablet    Has the patient contacted their pharmacy? Yes (Agent: If no, request that the patient contact the pharmacy for the refill. If patient does not wish to contact the pharmacy document the reason why and proceed with request.) (Agent: If yes, when and what did the pharmacy advise?)  This is the patient's preferred pharmacy:  [facility] - [place], [place] - [address] [place] [address] Phone: [phone] Fax: [phone]  Is this the correct pharmacy for this prescription? Yes If no, delete pharmacy and type the correct one.   Has the prescription been filled recently? Yes  Is the patient out of the medication? No  Has the patient been seen for an appointment in the last year OR does the patient have an upcoming appointment? Yes  Can we respond through MyChart? Yes  Agent: Please be advised that Rx refills may take up to 3 business days. We ask that you follow-up with your pharmacy.

## 2024-04-23 ENCOUNTER — Ambulatory Visit: Admitting: Family Medicine

## 2024-04-24 NOTE — Telephone Encounter (Signed)
 Requested medication (s) are due for refill today: yes  Requested medication (s) are on the active medication list: yes  Last refill:  12/28/23  Future visit scheduled: yes  Notes to clinic:  Unable to refill per protocol, cannot delegate.      Requested Prescriptions  Pending Prescriptions Disp Refills   oxyCODONE  (OXY IR/ROXICODONE ) 5 MG immediate release tablet 90 tablet 0    Sig: Take 1 tablet (5 mg total) by mouth 3 (three) times daily as needed for severe pain (pain score 7-10).     Not Delegated - Analgesics:  Opioid Agonists Failed - 04/24/2024 11:26 AM      Failed - This refill cannot be delegated      Failed - Urine Drug Screen completed in last 360 days      Failed - Valid encounter within last 3 months    Recent Outpatient Visits           6 months ago Acute kidney injury superimposed on stage 2 chronic kidney disease   [facility] P[facility], Sarah N, DO   7 months ago Type 2 diabetes mellitus with diabetic neuropathy, with long-term current use of insulin  [facility])   [facility] Pardue, Lauraine SAILOR, DO

## 2024-04-28 MED ORDER — OXYCODONE HCL 5 MG PO TABS: 5.0000 mg | ORAL_TABLET | Freq: Three times a day (TID) | ORAL | 0 refills | Status: AC | PRN

## 2024-05-09 NOTE — Progress Notes (Signed)
 Joseph Luna                                          MRN: [id number]   05/09/2024   The [facility] Quality Team Specialist reviewed this patient medical record for the purposes of chart review for care gap closure. The following were reviewed: chart review for care gap closure-glycemic status assessment.    [facility] Quality Team

## 2024-05-12 NOTE — Progress Notes (Signed)
 Joseph Luna                                          MRN: [id number]   05/12/2024   The [facility] Quality Team Specialist reviewed this patient medical record for the purposes of chart review for care gap closure. The following were reviewed: chart review for care gap closure-glycemic status assessment.    [facility] Quality Team

## 2024-05-14 ENCOUNTER — Ambulatory Visit

## 2024-05-14 ENCOUNTER — Ambulatory Visit: Admitting: Family Medicine

## 2024-06-03 ENCOUNTER — Ambulatory Visit: Admitting: Family Medicine

## 2024-08-06 ENCOUNTER — Ambulatory Visit
